# Patient Record
Sex: Female | Born: 1965
Health system: Southern US, Community
[De-identification: ages and names within clinical notes are randomized; demographics above are authoritative.]

## PROBLEM LIST (undated history)

## (undated) DIAGNOSIS — Z8709 Personal history of other diseases of the respiratory system: Secondary | ICD-10-CM

## (undated) DIAGNOSIS — Z8614 Personal history of Methicillin resistant Staphylococcus aureus infection: Secondary | ICD-10-CM

## (undated) DIAGNOSIS — Z8489 Family history of other specified conditions: Secondary | ICD-10-CM

## (undated) DIAGNOSIS — K219 Gastro-esophageal reflux disease without esophagitis: Secondary | ICD-10-CM

## (undated) DIAGNOSIS — T7840XA Allergy, unspecified, initial encounter: Secondary | ICD-10-CM

## (undated) DIAGNOSIS — R609 Edema, unspecified: Secondary | ICD-10-CM

## (undated) DIAGNOSIS — R51 Headache: Secondary | ICD-10-CM

## (undated) DIAGNOSIS — R42 Dizziness and giddiness: Secondary | ICD-10-CM

## (undated) DIAGNOSIS — K5909 Other constipation: Secondary | ICD-10-CM

## (undated) DIAGNOSIS — G932 Benign intracranial hypertension: Secondary | ICD-10-CM

## (undated) DIAGNOSIS — G4730 Sleep apnea, unspecified: Secondary | ICD-10-CM

## (undated) DIAGNOSIS — L309 Dermatitis, unspecified: Secondary | ICD-10-CM

## (undated) DIAGNOSIS — M545 Low back pain, unspecified: Secondary | ICD-10-CM

## (undated) DIAGNOSIS — I10 Essential (primary) hypertension: Secondary | ICD-10-CM

## (undated) DIAGNOSIS — M1990 Unspecified osteoarthritis, unspecified site: Secondary | ICD-10-CM

## (undated) DIAGNOSIS — M549 Dorsalgia, unspecified: Secondary | ICD-10-CM

## (undated) DIAGNOSIS — H93A9 Pulsatile tinnitus, unspecified ear: Secondary | ICD-10-CM

## (undated) DIAGNOSIS — E669 Obesity, unspecified: Secondary | ICD-10-CM

## (undated) DIAGNOSIS — R600 Localized edema: Secondary | ICD-10-CM

## (undated) DIAGNOSIS — E559 Vitamin D deficiency, unspecified: Secondary | ICD-10-CM

## (undated) HISTORY — PX: GALLBLADDER SURGERY: SHX652

## (undated) HISTORY — DX: Allergy, unspecified, initial encounter: T78.40XA

## (undated) HISTORY — DX: Obesity, unspecified: E66.9

## (undated) HISTORY — DX: Low back pain, unspecified: M54.50

## (undated) HISTORY — PX: CARDIOVASCULAR STRESS TEST: SHX262

## (undated) HISTORY — DX: Vitamin D deficiency, unspecified: E55.9

## (undated) HISTORY — DX: Low back pain: M54.5

## (undated) HISTORY — PX: CHOLECYSTECTOMY: SHX55

## (undated) HISTORY — DX: Pulsatile tinnitus, unspecified ear: H93.A9

## (undated) HISTORY — DX: Essential (primary) hypertension: I10

## (undated) HISTORY — DX: Gastro-esophageal reflux disease without esophagitis: K21.9

## (undated) HISTORY — PX: US ECHOCARDIOGRAPHY: HXRAD669

## (undated) HISTORY — PX: ABDOMINAL HYSTERECTOMY: SHX81

---

## 1999-04-11 ENCOUNTER — Ambulatory Visit (HOSPITAL_COMMUNITY): Admission: RE | Admit: 1999-04-11 | Discharge: 1999-04-11 | Payer: Self-pay | Admitting: Interventional Cardiology

## 1999-04-15 ENCOUNTER — Encounter: Admission: RE | Admit: 1999-04-15 | Discharge: 1999-04-15 | Payer: Self-pay | Admitting: Obstetrics and Gynecology

## 2001-05-24 ENCOUNTER — Other Ambulatory Visit: Admission: RE | Admit: 2001-05-24 | Discharge: 2001-05-24 | Payer: Self-pay | Admitting: Obstetrics and Gynecology

## 2001-08-08 ENCOUNTER — Encounter: Payer: Self-pay | Admitting: Internal Medicine

## 2001-08-08 ENCOUNTER — Encounter: Admission: RE | Admit: 2001-08-08 | Discharge: 2001-08-08 | Payer: Self-pay | Admitting: Internal Medicine

## 2002-01-20 ENCOUNTER — Encounter: Payer: Self-pay | Admitting: Emergency Medicine

## 2002-01-20 ENCOUNTER — Emergency Department (HOSPITAL_COMMUNITY): Admission: EM | Admit: 2002-01-20 | Discharge: 2002-01-20 | Payer: Self-pay | Admitting: Emergency Medicine

## 2002-02-22 ENCOUNTER — Encounter: Payer: Self-pay | Admitting: Family Medicine

## 2002-02-22 ENCOUNTER — Ambulatory Visit (HOSPITAL_COMMUNITY): Admission: RE | Admit: 2002-02-22 | Discharge: 2002-02-22 | Payer: Self-pay | Admitting: Family Medicine

## 2002-04-12 ENCOUNTER — Encounter: Admission: RE | Admit: 2002-04-12 | Discharge: 2002-04-12 | Payer: Self-pay | Admitting: Family Medicine

## 2002-04-12 ENCOUNTER — Encounter: Payer: Self-pay | Admitting: Family Medicine

## 2002-04-28 ENCOUNTER — Encounter: Admission: RE | Admit: 2002-04-28 | Discharge: 2002-04-28 | Payer: Self-pay | Admitting: Family Medicine

## 2002-04-28 ENCOUNTER — Encounter: Payer: Self-pay | Admitting: Family Medicine

## 2002-06-09 ENCOUNTER — Other Ambulatory Visit: Admission: RE | Admit: 2002-06-09 | Discharge: 2002-06-09 | Payer: Self-pay | Admitting: Obstetrics and Gynecology

## 2002-08-26 ENCOUNTER — Emergency Department (HOSPITAL_COMMUNITY): Admission: EM | Admit: 2002-08-26 | Discharge: 2002-08-26 | Payer: Self-pay | Admitting: Emergency Medicine

## 2002-08-26 ENCOUNTER — Encounter: Payer: Self-pay | Admitting: Emergency Medicine

## 2003-05-25 ENCOUNTER — Other Ambulatory Visit: Admission: RE | Admit: 2003-05-25 | Discharge: 2003-05-25 | Payer: Self-pay | Admitting: Obstetrics and Gynecology

## 2003-12-06 ENCOUNTER — Encounter (INDEPENDENT_AMBULATORY_CARE_PROVIDER_SITE_OTHER): Payer: Self-pay | Admitting: Specialist

## 2003-12-06 ENCOUNTER — Inpatient Hospital Stay (HOSPITAL_COMMUNITY): Admission: AD | Admit: 2003-12-06 | Discharge: 2003-12-09 | Payer: Self-pay | Admitting: Obstetrics and Gynecology

## 2004-02-26 ENCOUNTER — Ambulatory Visit: Admission: RE | Admit: 2004-02-26 | Discharge: 2004-02-26 | Payer: Self-pay | Admitting: Internal Medicine

## 2004-02-26 ENCOUNTER — Encounter (INDEPENDENT_AMBULATORY_CARE_PROVIDER_SITE_OTHER): Payer: Self-pay | Admitting: Cardiology

## 2005-06-02 ENCOUNTER — Encounter: Admission: RE | Admit: 2005-06-02 | Discharge: 2005-06-02 | Payer: Self-pay | Admitting: Obstetrics and Gynecology

## 2006-05-24 ENCOUNTER — Emergency Department (HOSPITAL_COMMUNITY): Admission: EM | Admit: 2006-05-24 | Discharge: 2006-05-24 | Payer: Self-pay | Admitting: Family Medicine

## 2006-07-28 ENCOUNTER — Encounter: Admission: RE | Admit: 2006-07-28 | Discharge: 2006-07-28 | Payer: Self-pay | Admitting: Obstetrics and Gynecology

## 2007-02-22 ENCOUNTER — Emergency Department (HOSPITAL_COMMUNITY): Admission: EM | Admit: 2007-02-22 | Discharge: 2007-02-22 | Payer: Self-pay | Admitting: Emergency Medicine

## 2007-04-28 ENCOUNTER — Emergency Department (HOSPITAL_COMMUNITY): Admission: EM | Admit: 2007-04-28 | Discharge: 2007-04-28 | Payer: Self-pay | Admitting: Emergency Medicine

## 2007-09-22 ENCOUNTER — Encounter: Admission: RE | Admit: 2007-09-22 | Discharge: 2007-09-22 | Payer: Self-pay | Admitting: Internal Medicine

## 2008-03-29 ENCOUNTER — Encounter: Admission: RE | Admit: 2008-03-29 | Discharge: 2008-03-29 | Payer: Self-pay | Admitting: Internal Medicine

## 2008-06-14 ENCOUNTER — Encounter: Admission: RE | Admit: 2008-06-14 | Discharge: 2008-06-14 | Payer: Self-pay | Admitting: Internal Medicine

## 2008-08-07 ENCOUNTER — Ambulatory Visit (HOSPITAL_COMMUNITY): Admission: AD | Admit: 2008-08-07 | Discharge: 2008-08-07 | Payer: Self-pay | Admitting: Sports Medicine

## 2008-08-24 ENCOUNTER — Encounter: Admission: RE | Admit: 2008-08-24 | Discharge: 2008-08-24 | Payer: Self-pay | Admitting: Sports Medicine

## 2008-11-16 ENCOUNTER — Encounter: Admission: RE | Admit: 2008-11-16 | Discharge: 2008-11-16 | Payer: Self-pay | Admitting: Sports Medicine

## 2008-12-16 ENCOUNTER — Emergency Department (HOSPITAL_COMMUNITY): Admission: EM | Admit: 2008-12-16 | Discharge: 2008-12-16 | Payer: Self-pay | Admitting: Emergency Medicine

## 2008-12-21 ENCOUNTER — Encounter: Admission: RE | Admit: 2008-12-21 | Discharge: 2008-12-21 | Payer: Self-pay | Admitting: Sports Medicine

## 2009-08-13 ENCOUNTER — Encounter: Admission: RE | Admit: 2009-08-13 | Discharge: 2009-08-13 | Payer: Self-pay | Admitting: Obstetrics and Gynecology

## 2010-03-28 ENCOUNTER — Encounter: Admission: RE | Admit: 2010-03-28 | Discharge: 2010-03-28 | Payer: Self-pay | Admitting: Internal Medicine

## 2010-08-11 ENCOUNTER — Other Ambulatory Visit: Payer: Self-pay | Admitting: Obstetrics and Gynecology

## 2010-08-11 DIAGNOSIS — Z1231 Encounter for screening mammogram for malignant neoplasm of breast: Secondary | ICD-10-CM

## 2010-09-08 ENCOUNTER — Ambulatory Visit
Admission: RE | Admit: 2010-09-08 | Discharge: 2010-09-08 | Disposition: A | Discharge status: Home / Self Care | Payer: Commercial Managed Care - PPO | Source: Ambulatory Visit | Attending: Obstetrics and Gynecology | Admitting: Obstetrics and Gynecology

## 2010-09-08 DIAGNOSIS — Z1231 Encounter for screening mammogram for malignant neoplasm of breast: Secondary | ICD-10-CM

## 2010-09-09 ENCOUNTER — Other Ambulatory Visit: Payer: Self-pay | Admitting: Internal Medicine

## 2010-09-09 DIAGNOSIS — R0989 Other specified symptoms and signs involving the circulatory and respiratory systems: Secondary | ICD-10-CM

## 2010-10-09 ENCOUNTER — Ambulatory Visit
Admission: RE | Admit: 2010-10-09 | Discharge: 2010-10-09 | Disposition: A | Discharge status: Home / Self Care | Payer: Commercial Managed Care - PPO | Source: Ambulatory Visit | Attending: Internal Medicine | Admitting: Internal Medicine

## 2010-10-09 DIAGNOSIS — R0989 Other specified symptoms and signs involving the circulatory and respiratory systems: Secondary | ICD-10-CM

## 2010-10-17 ENCOUNTER — Encounter: Payer: Self-pay | Admitting: Internal Medicine

## 2010-11-21 NOTE — Discharge Summary (Signed)
[name], [name]                          ACCOUNT NO.:  [id number]   MEDICAL RECORD NO.:  [id number]                   PATIENT TYPE:  INP   LOCATION:  9114                                 FACILITY:  [facility]   PHYSICIAN:  Maxie Better, M.D.            DATE OF BIRTH:  1965-07-20   DATE OF ADMISSION:  12/06/2003  DATE OF DISCHARGE:  12/09/2003                                 DISCHARGE SUMMARY   ADMISSION DIAGNOSES:  1. Term gestation.  2. Chronic hypertension.  3. Spontaneous rupture of membranes.  4. Group B strep positive.   DISCHARGE DIAGNOSES:  1. Term gestation, delivered.  2. Chronic hypertension.  3. Status post a primary cesarean section.  4. Presumed chorioamnionitis, resolved.  5. Arrest of dilatation.  6. Desired sterilization.  7. Right paratubal cyst.  8. Postoperative anemia.   HISTORY OF PRESENT ILLNESS:  A 45 year old, gravida 2, para 1-0-0-1, female  at term with chronic hypertension, controlled on Aldomet, admitted to  [facility] with spontaneous rupture of membranes, clear amniotic  fluid.  The patient is known to be group B strep culture positive.  She has  had antepartum fetal surveillance due to her chronic hypertension.  Estimated fetal weight by ultrasound was 8 pounds, 1 ounce.  The patient's  blood type is B positive.  Rubella was immune.  Hepatitis B surface antigen  was negative.   HOSPITAL COURSE:  The patient was admitted to [facility].  Clear  amniotic fluid was noted at 6:30 a.m.  Penicillin prophylaxis was started.  The patient was continued on her Aldomet.  Low-dose Pitocin was started.  The patient had an epidural when she was 1 cm, 80%, -3, due to increased  pain with her contractions.  She was contracting every 3 minutes at that  time.  Each contraction was 80 [place] units.  Intrauterine pressure  catheter had been placed.  The patient progressed to 4 cm, about 80%, -2.  She had a temperature of 100.1 around 9:10 p.m.   Presumed chorioamnionitis  was diagnosed at that time, and the patient was changed to ampicillin and  gentamicin.  Pitocin was continued.  The patient was begun having repetitive  late decelerations, which initially were noted after the patient had a low  blood pressure shortly after the epidural placement.  The patient received  IV fluid boluses, as well as ephedrine x3.  The blood pressure returned back  to her baseline; however, her late decelerations persisted, and therefore  the Pitocin was discontinued.  The patient was given maternal oxygenation.  Scalp stimulation was noted to be positive.  The cervix was edematous, 4 cm,  -2.  Her contractions were about 230 [place] units over several hours  with no further change in the cervix or fetal position, despite exaggerated  __________  position.  Given the findings of the edematous cervix, and no  change despite adequate labor for several hours, the recommendation  was made  for cesarean section.  The patient was taken to the operating room for a  cesarean section.  She also requested and desired sterilization.  She  underwent a primary cesarean section which resulted in a live female from the  left occiput posterior presentation, weighing 8 pounds, 2 ounces.  Normal  ovaries were noted bilaterally.  She had a right paratubal cyst that was  removed.  Apgars of 8/9.  Cord pH was 7.29.  The patient was continued on  her antibiotics postoperatively until she was afebrile.  On postoperative  day #2, the incision was inspected.  No erythema or induration was noted;  however __________  with a known large subcutaneous space.  About 40 cc of  serosanguineous fluid was noted.  The fascia was intact on Q-tips, and the  staples remained in place.  On postoperative day #3, the patient had  remained afebrile, tolerating a regular diet, blood pressures ranged between  109-125/66-75, and was deemed well to be discharged home.   DISPOSITION:   Home.   CONDITION:  Stable.   DISCHARGE MEDICATIONS:  1. Aldomet 250 mg p.o. b.i.d.  2. Tylox 1-2 tablets q.3-4h. p.r.n. pain.  3. Motrin 600 mg q.6h. p.r.n. pain.  4. Prenatal vitamins one p.o. daily.  5. Over-the-counter iron supplementation one p.o. daily.   FOLLOW UP APPOINTMENT:  Staple removal in the office on December 12, 2003, and  follow up appointment otherwise in 4 weeks.   DISCHARGE INSTRUCTIONS:  Per the postpartum booklet given.   FINAL PATHOLOGY:  Complete transection of the fallopian tube, and a benign  paratubal cyst.  The placenta had mild acute chorioamnionitis.                                               Maxie Better, M.D.    [place]/MEDQ  D:  12/22/2003  T:  12/24/2003  Job:  [id number]

## 2010-11-21 NOTE — Op Note (Signed)
[name], [name]                          ACCOUNT NO.:  [id number]   MEDICAL RECORD NO.:  [id number]                   PATIENT TYPE:  INP   LOCATION:  9114                                 [facility]ACILITY[facility]  [facility]   PHYSICIAN:  Maxie Better, M.D.            DATE OF BIRTH:  [birth year]   DATE OF PROCEDURE:  12/06/2003  DATE OF DISCHARGE:                                 OPERATIVE REPORT   PREOPERATIVE DIAGNOSES:  1. Arrest of dilatation.  2. Term gestation.  3. Desires sterilization.   PROCEDURE:  1. Primary cesarean section  2. Modified Pomeroy tubal ligation.  3. Right paratubal cyst removal.   POSTOPERATIVE DIAGNOSES:  1. Arrest of dilatation.  2. Right paratubal cyst.  3. Desires sterilization.  4. Term gestation.   ANESTHESIA:  Epidural.   SURGEON:  Maxie Better, M.D.   ASSISTANT:  Cordelia Pen A. Rosalio Macadamia, M.D.   INDICATIONS:  This is a 45 year old gravida 2, para 1 married black female  at term, with chronic hypertension controlled on Aldomet.  She presented on  December 06, 2003 with spontaneous rupture of membranes and early labor.  The  patient during the course of labor developed a temperature of 99.1 axillary,  for which she received ampicillin and gentamicin or presumed  chorioamnionitis.  An intrauterine pressure catheter has been placed for  assessment of her contractile pattern.  Low-dose Pitocin was started.  Penicillin prophylaxis was used for her known Group B strep culture  positivity.   The patient had adequate contractile strength for several hours and  progressed only to 4-5 cm dilatation, at which time the patient began to  have edematous cervix.  The patient was noted to have some repetitive late  deceleration, coinciding shortly after epidural bolus that resulted in  hypotension.  The Pitocin was subsequently discontinued as a result of the  late deceleration.  The fetus had a positive scalp stimulation thereafter;  however, given her clinical  examination, decision was made to proceed with a  cesarean section for arrest of dilatation.   Risks and benefits of the procedure have been explained to the patient.  Consent had been signed.  The patient desires permanent sterilization.   DESCRIPTION OF PROCEDURE:  Under adequate epidural anesthesia, the patient  was placed in a supine position with the left lateral tilt.  An indwelling  Foley catheter was already in place.  The patient was sterilely prepped and  draped in the usual fashion.  A Pfannenstiel skin incision was made after  0.25% Marcaine was injected.  The Pfannenstiel incision was carried down to  the rectus fascia.  Rectus fascia was incised in the midline, extended  bilaterally.  The rectus fascia was then bluntly and sharply dissected off  the rectus muscle superiorly and inferiorly.  The rectus muscle was split in  the midline.  The parietoperitoneum was entered bluntly and extended.  Vesicouterine peritoneum was developed.  The bladder was bluntly  dissected  off the lower uterine segment.  A low transverse uterine incision was then  made and extended bilaterally using bandage scissors.  On entering the  abdominal cavity there had been moderate amount of fluid.  The uterine  incision, having been extended bilaterally, resulted in delivery of a live  female from the left occiput posterior presentation.  His weight was 8 pounds  2 ounces.  Normal tubes and ovaries were noted bilaterally.  The right tube  had a paratubal cyst noted, that measured over 1.5 cm.  The placenta was  spontaneously delivered intact.  Cord pH was obtained and was 7.29.  Apgar's  were given 8 and 9.   After the delivery of the baby and the placenta, the uterine cavity was  cleaned of debris.  It was noted that there was an extension on the right  inferior aspect of the incision, which was closed with 0 Monocryl running,  locked stitch.  The remaining incision was then closed with 0 Monocryl   running, locked stitch; embrocated with 0 Monocryl suture in the second  layer.  There were several bleeding sites noted, with small cauterization  utilized; with the bleeding on the left aspect of incision that required  several figure-of-eight 0 Monocryl sutures, with subsequent hemostasis  noted.  The peritoneal edges were then cauterized, as well as the bleeding  of the undersurface of the bladder (which had small bleeders which were  cauterized).   Attention was then subsequently turned to the tubes and ovaries, where both  tubes were identified down to their fimbriated end.  The right paratubal  cyst was removed.  The mid portion of the fallopian tubes bilaterally was  removed and the proximal and distal portion of the tubes bilaterally were  tied with 0 chromic sutures -- proximally x2 and distally x2.  The abdomen  was irrigated, suctioned of debris.  Reinspection of the lower uterine  segment was noted for small bleeders again, which were then cauterized.   After a satisfactory hemostasis was felt to have been accomplished, the  abdomen was irrigated and suctioned of debris.  The rectus  muscles were  inspected, left small bleeders cauterized.  The undersurface of the rectus  fascia was inspected and small bleeders cauterized.  The rectus fascia was  then closed with 0 Vicryl x2.  The subcutaneous area was irrigated and small  bleeders cauterized, and the skin approximated using Ethibond staples.   SPECIMENS:  Placenta sent to pathology.  Portion of the right and left  fallopian tubes.  Right paratubal cyst sent to pathology.   COUNTS:  Sponge and instrument counts x2 was correct.   ESTIMATED BLOOD LOSS:  700 cc.   INTRAOPERATIVE FLUID:  2500 cc Crystalloid.   URINE OUTPUT:  100 cc of concentrated urine.   COMPLICATIONS:  None.   DISPOSITION:  The patient tolerated the procedure well and was transferred  to the recovery room in stable condition.                                              Maxie Better, M.D.   [place]/MEDQ  D:  12/07/2003  T:  12/07/2003  Job:  [id number]

## 2010-11-21 NOTE — H&P (Signed)
[name], DONE                          ACCOUNT NO.:  [id number]   MEDICAL RECORD NO.:  [id number]                   PATIENT TYPE:  INP   LOCATION:  9167                                 FACILITY:  [facility]   PHYSICIAN:  Maxie Better, M.D.            DATE OF BIRTH:  01/24/1966   DATE OF ADMISSION:  12/06/2003  DATE OF DISCHARGE:                                HISTORY & PHYSICAL   CHIEF COMPLAINT:  Spontaneous rupture of membranes at 6:30 a.m., clear  fluid.   HISTORY OF PRESENT ILLNESS:  This is a 45 year old gravida 2, para 1-0-0-1  married black female at term with chronic hypertension controlled on  Aldomet, now admitted at [facility] for spontaneous rupture of  membranes with clear amniotic fluid at 6;30 a.m.  The patient is known to be  group B strep culture positive.  She has been having some irregular  contractions subsequent to rupturing her membranes.  The patient has been  followed closely by Dr. Sherrie George, perinatologist, due to her chronic  hypertension and has had antepartum fetal surveillance which has been  reassuring.  The patient has been measured size greater than dates with  estimated fetal weight done on last ultrasound a week ago was 8 pounds 1  ounce.  The amniotic fluid has been in the upper normal range.  The patient  has noted good fetal movement.  Prenatal care is at [facility], primary  physician Maxie Better, M.D.   PRENATAL LABORATORY DATA:  Group B strep positive.  Blood type is B  positive.  Hemoglobin electrophoresis is normal.  RPR is nonreactive.  Rubella is immune.  Hepatitis B surface antigen is negative. GC and  Chlamydia cultures were negative.  Tap was normal.  One-hour glucose  challenge test was normal.  The patient underwent anatomic fetal survey at  [facility].  The patient had first trimester genetic screening at  [facility] which was normal.  The patient declined an  amniocentesis.   The patient  desires tubal ligation.  Her ASP for open neural tube defect was  normal.   ALLERGIES:  No known drug allergies.   MEDICATIONS:  Prenatal vitamins and Aldomet.   PAST MEDICAL HISTORY:  1. Chronic hypertension.  2. Exogenous obesity.   PAST SURGICAL HISTORY:  Laparoscopic cholecystectomy in 1999.   PAST OBSTETRICAL HISTORY:  Vaginal delivery February 1992 of 8 pound 9 ounce  baby at 40 weeks.   FAMILY HISTORY:  Maternal aunt died of breast cancer.  Mother has diabetes,  rheumatoid arthritis.   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   REVIEW OF SYSTEMS:  Positive for lower extremity edema throughout pregnancy.  Otherwise as per HPI.   PHYSICAL EXAMINATION:  GENERAL:  Gravid black female in mild distress.  VITAL SIGNS:  Blood pressure 141/78, afebrile.  SKIN:  No lesions.  HEENT:  Anicteric sclerae.  Pink conjunctivae.  Oropharynx  negative.  HEART:  Regular rate and rhythm without murmur.  LUNGS:  Clear to auscultation.  BREASTS:  Soft, nontender.  No palpable mass.  ABDOMEN:  Gravid.  PELVIC:  1 cm, 50% effaced presenting part vertex -3.  EXTREMITIES:  2+ edema.   LABORATORY AND X-RAY DATA:  Tracing was baseline fetal heart rate 120s with  contractions irregular every 3 to 4 minutes.   IMPRESSION:  1. Term gestation.  2. Chronic hypertension on Aldomet.  3. Group B Streptococcus culture positive.  4. Spontaneous rupture of membranes.   PLAN:  1. Admission.  2. Continue Aldomet medication.  3. Start low-dose Pitocin.  4. Penicillin prophylaxis.  5. Routine admission labs and orders per [facility].  6. PIH labs p.r.n.  No indication for magnesium sulfate at this time.                                               Maxie Better, M.D.    [place]/MEDQ  D:  12/06/2003  T:  12/06/2003  Job:  [id number]

## 2011-01-16 ENCOUNTER — Other Ambulatory Visit: Payer: Self-pay | Admitting: Internal Medicine

## 2011-01-16 DIAGNOSIS — H9201 Otalgia, right ear: Secondary | ICD-10-CM

## 2011-01-22 ENCOUNTER — Other Ambulatory Visit: Payer: Commercial Managed Care - PPO

## 2011-01-23 ENCOUNTER — Ambulatory Visit
Admission: RE | Admit: 2011-01-23 | Discharge: 2011-01-23 | Disposition: A | Discharge status: Home / Self Care | Payer: Commercial Managed Care - PPO | Source: Ambulatory Visit | Attending: Internal Medicine | Admitting: Internal Medicine

## 2011-01-23 DIAGNOSIS — H9201 Otalgia, right ear: Secondary | ICD-10-CM

## 2011-01-23 MED ORDER — GADOBENATE DIMEGLUMINE 529 MG/ML IV SOLN
20.0000 mL | Freq: Once | INTRAVENOUS | Status: AC | PRN
  Administered 2011-01-23: 20 mL via INTRAVENOUS

## 2011-04-15 LAB — POCT RAPID STREP A: Streptococcus, Group A Screen (Direct): NEGATIVE

## 2011-09-15 ENCOUNTER — Other Ambulatory Visit: Payer: Self-pay | Admitting: Obstetrics and Gynecology

## 2011-09-15 DIAGNOSIS — Z1231 Encounter for screening mammogram for malignant neoplasm of breast: Secondary | ICD-10-CM

## 2011-10-05 ENCOUNTER — Ambulatory Visit
Admission: RE | Admit: 2011-10-05 | Discharge: 2011-10-05 | Disposition: A | Discharge status: Home / Self Care | Payer: 59 | Source: Ambulatory Visit | Attending: Obstetrics and Gynecology | Admitting: Obstetrics and Gynecology

## 2011-10-05 ENCOUNTER — Ambulatory Visit: Payer: Commercial Managed Care - PPO

## 2011-10-05 DIAGNOSIS — Z1231 Encounter for screening mammogram for malignant neoplasm of breast: Secondary | ICD-10-CM

## 2011-10-06 ENCOUNTER — Ambulatory Visit: Payer: Commercial Managed Care - PPO

## 2011-10-10 LAB — HM PAP SMEAR

## 2011-10-10 LAB — HM MAMMOGRAPHY

## 2011-12-10 LAB — CBC AND DIFFERENTIAL
HCT: 41 % (ref 36–46)
Hemoglobin: 13.2 g/dL (ref 12.0–16.0)
Platelets: 333 10*3/uL (ref 150–399)

## 2011-12-10 LAB — BASIC METABOLIC PANEL
BUN: 11 mg/dL (ref 4–21)
Glucose: 83 mg/dL

## 2011-12-10 LAB — HEPATIC FUNCTION PANEL
AST: 19 U/L (ref 13–35)
Alkaline Phosphatase: 72 U/L (ref 25–125)
Bilirubin, Total: 0.5 mg/dL

## 2012-07-20 ENCOUNTER — Encounter: Payer: Self-pay | Admitting: Hematology

## 2012-07-20 DIAGNOSIS — I10 Essential (primary) hypertension: Secondary | ICD-10-CM | POA: Insufficient documentation

## 2012-07-20 DIAGNOSIS — E669 Obesity, unspecified: Secondary | ICD-10-CM

## 2012-07-20 DIAGNOSIS — F439 Reaction to severe stress, unspecified: Secondary | ICD-10-CM

## 2012-07-20 DIAGNOSIS — H9209 Otalgia, unspecified ear: Secondary | ICD-10-CM | POA: Insufficient documentation

## 2012-07-31 ENCOUNTER — Emergency Department (HOSPITAL_COMMUNITY)
Admission: EM | Admit: 2012-07-31 | Discharge: 2012-07-31 | Disposition: A | Discharge status: Home / Self Care | Payer: 59 | Source: Home / Self Care | Attending: Emergency Medicine | Admitting: Emergency Medicine

## 2012-07-31 ENCOUNTER — Encounter (HOSPITAL_COMMUNITY): Payer: Self-pay | Admitting: Emergency Medicine

## 2012-07-31 DIAGNOSIS — J0100 Acute maxillary sinusitis, unspecified: Secondary | ICD-10-CM

## 2012-07-31 MED ORDER — AMOXICILLIN-POT CLAVULANATE 875-125 MG PO TABS: 1.0000 | ORAL_TABLET | Freq: Two times a day (BID) | ORAL | Status: DC

## 2012-07-31 NOTE — ED Provider Notes (Signed)
Chief Complaint  Patient presents with  . Facial Pain    sinus congestion. green mucus/blood tinged. 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 She's also had maxillary pain and pressure on the right which radiates towards her ear and into the neck and she's had some postnasal drip, her right eye has been somewhat red, she's had sore throat, and cough. She has seasonal allergies and is on Zyrtec for that. She also takes but has a pill for high blood pressure.  Review of Systems:  Other than noted above, the patient denies any of the following symptoms. Systemic:  No fever, chills, sweats, fatigue, myalgias, headache, or anorexia. Eye:  No redness, pain or drainage. ENT:  No earache, ear congestion, nasal congestion, sneezing, rhinorrhea, sinus pressure, sinus pain, post nasal drip, or sore throat. Lungs:  No cough, sputum production, wheezing, shortness of breath, or chest pain. GI:  No abdominal pain, nausea, vomiting, or diarrhea.  PMFSH:  Past medical history, family history, social history, meds, and allergies were reviewed.  Physical Exam:   Vital signs:  BP 119/80  Pulse 74  Temp 98.4 F (36.9 C) (Oral)  Resp 17  SpO2 99%  LMP 07/06/2012 General:  Alert, in no distress. Eye:  No conjunctival injection or drainage. Lids were normal. ENT:  TMs and canals were normal, without erythema or inflammation.  Nasal mucosa was clear and uncongested, without drainage.  Mucous membranes were moist.  Pharynx was clear, without exudate or drainage.  There were no oral ulcerations or lesions. Neck:  Supple, no adenopathy, tenderness or mass. Lungs:  No respiratory distress.  Lungs were clear to auscultation, without wheezes, rales or rhonchi.  Breath sounds were  clear and equal bilaterally.  Heart:  Regular rhythm, without gallops, murmers or rubs. Skin:  Clear, warm, and dry, without rash or lesions.  Assessment:  The encounter diagnosis was Acute maxillary sinusitis.  Plan:   1.  The following meds were prescribed:   New Prescriptions   AMOXICILLIN-CLAVULANATE (AUGMENTIN) 875-125 MG PER TABLET    Take 1 tablet by mouth 2 (two) times daily.   2.  The patient was instructed in symptomatic care and handouts were given. 3.  The patient was told to return if becoming worse in any way, if no better in 3 or 4 days, and given some red flag symptoms that would indicate earlier return.   Reuben Likes, MD 07/31/12 [phone]

## 2012-07-31 NOTE — ED Notes (Signed)
Pt c/o sinus pressure. And with blowing nose produces green mucus tinged with blood. Severe stuffiness of right nostril. Post nasal drip. Symptoms present x 1wk.  Pt has used saline nasal rinses and humidifier with only mild relief. Pt denies fever and any other symptoms.

## 2012-08-20 ENCOUNTER — Other Ambulatory Visit: Payer: Self-pay

## 2012-08-22 ENCOUNTER — Emergency Department (HOSPITAL_COMMUNITY)
Admission: EM | Admit: 2012-08-22 | Discharge: 2012-08-22 | Disposition: A | Discharge status: Home / Self Care | Payer: 59 | Attending: Emergency Medicine | Admitting: Emergency Medicine

## 2012-08-22 ENCOUNTER — Emergency Department (HOSPITAL_COMMUNITY): Payer: 59

## 2012-08-22 ENCOUNTER — Encounter (HOSPITAL_COMMUNITY): Payer: Self-pay | Admitting: Emergency Medicine

## 2012-08-22 DIAGNOSIS — R0789 Other chest pain: Secondary | ICD-10-CM

## 2012-08-22 DIAGNOSIS — I10 Essential (primary) hypertension: Secondary | ICD-10-CM | POA: Insufficient documentation

## 2012-08-22 DIAGNOSIS — Z9109 Other allergy status, other than to drugs and biological substances: Secondary | ICD-10-CM | POA: Insufficient documentation

## 2012-08-22 DIAGNOSIS — Z79899 Other long term (current) drug therapy: Secondary | ICD-10-CM | POA: Insufficient documentation

## 2012-08-22 DIAGNOSIS — E669 Obesity, unspecified: Secondary | ICD-10-CM | POA: Insufficient documentation

## 2012-08-22 LAB — CBC WITH DIFFERENTIAL/PLATELET
Basophils Absolute: 0 10*3/uL (ref 0.0–0.1)
Basophils Relative: 1 % (ref 0–1)
Eosinophils Absolute: 0.2 10*3/uL (ref 0.0–0.7)
Eosinophils Relative: 3 % (ref 0–5)
Lymphs Abs: 2.2 10*3/uL (ref 0.7–4.0)
MCH: 27.7 pg (ref 26.0–34.0)
MCV: 84 fL (ref 78.0–100.0)
Neutrophils Relative %: 41 % — ABNORMAL LOW (ref 43–77)
Platelets: 281 10*3/uL (ref 150–400)
RBC: 4.55 MIL/uL (ref 3.87–5.11)
RDW: 13.6 % (ref 11.5–15.5)

## 2012-08-22 LAB — BASIC METABOLIC PANEL
Calcium: 8.6 mg/dL (ref 8.4–10.5)
GFR calc Af Amer: >90 mL/min (ref >90)
GFR calc non Af Amer: >90 mL/min (ref >90)
Glucose, Bld: 102 mg/dL — ABNORMAL HIGH (ref 70–99)
Potassium: 3.6 mEq/L (ref 3.5–5.1)
Sodium: 140 mEq/L (ref 135–145)

## 2012-08-22 LAB — POCT I-STAT TROPONIN I
Troponin i, poc: 0 ng/mL (ref 0.00–0.08)
Troponin i, poc: 0 ng/mL (ref 0.00–0.08)

## 2012-08-22 MED ORDER — ONDANSETRON HCL 4 MG/2ML IJ SOLN
4.0000 mg | Freq: Once | INTRAMUSCULAR | Status: DC
  Filled 2012-08-22: qty 2

## 2012-08-22 MED ORDER — MORPHINE SULFATE 4 MG/ML IJ SOLN
4.0000 mg | Freq: Once | INTRAMUSCULAR | Status: DC
  Filled 2012-08-22: qty 1

## 2012-08-22 MED ORDER — HYDROCODONE-ACETAMINOPHEN 5-325 MG PO TABS: 1.0000 | ORAL_TABLET | Freq: Four times a day (QID) | ORAL | Status: DC | PRN

## 2012-08-22 MED ORDER — IBUPROFEN 800 MG PO TABS: 800.0000 mg | ORAL_TABLET | Freq: Three times a day (TID) | ORAL | Status: DC | PRN

## 2012-08-22 NOTE — ED Notes (Signed)
Patient transported to X-ray 

## 2012-08-22 NOTE — ED Provider Notes (Signed)
History     CSN: [id number]  Arrival date & time 08/22/12  3244   First MD Initiated Contact with Patient 08/22/12 0940      Chief Complaint  Patient presents with  . Chest Pain    (Consider location/radiation/quality/duration/timing/severity/associated sxs/prior treatment) HPI Patient is a 47 yo female who presents today with chest pressure.  This pressure has been going on for three years ever since she switched jobs.  It's waxing and waning and over the past six months it's increased in frequency and that is the reason she came in today.  She notices that it's worse with stress and if she relaxes she can make the pressure go away.  No changes in pressure with certain types of food or positions.  The pressure is located in the middle of her chest at the sternum and it radiates to the back.  Denies any shortness of breath, nausea, vomiting, diarrhea, constipation, abdominal pain or dysuria.  10-15 years ago she had a cholecystectomy.   Past Medical History  Diagnosis Date  . Hypertension   . Allergy   . Unspecified vitamin D deficiency   . Lumbago   . Obesity, unspecified     History reviewed. No pertinent past surgical history.  No family history on file.  History  Substance Use Topics  . Smoking status: Never Smoker   . Smokeless tobacco: Not on file  . Alcohol Use: No    OB History   Grav Para Term Preterm Abortions TAB SAB Ect Mult Living                  Review of Systems All other systems negative except as documented in the HPI. All pertinent positives and negatives as reviewed in the HPI.  Allergies  Review of patient's allergies indicates no known allergies.  Home Medications   Current Outpatient Rx  Name  Route  Sig  Dispense  Refill  . benazepril-hydrochlorthiazide (LOTENSIN HCT) 20-25 MG per tablet   Oral   Take 1 tablet by mouth daily.         . cetirizine (ZYRTEC) 10 MG tablet   Oral   Take 10 mg by mouth daily.           . fluticasone  (FLONASE) 50 MCG/ACT nasal spray   Nasal   2 sprays by Nasal route daily.           Marland Kitchen levalbuterol (XOPENEX HFA) 45 MCG/ACT inhaler   Inhalation   Inhale 1-2 puffs into the lungs as needed.           Marland Kitchen OVER THE COUNTER MEDICATION   Oral   Take 1 tablet by mouth daily as needed. OTC for pain           BP 112/73  Pulse 77  Temp(Src) 98.1 F (36.7 C) (Oral)  SpO2 96%  LMP 07/06/2012  Physical Exam  Constitutional: She is oriented to person, place, and time. She appears well-developed and well-nourished. No distress.  HENT:  Head: Normocephalic and atraumatic.  Eyes: Conjunctivae and EOM are normal. Pupils are equal, round, and reactive to light.  Neck: Normal range of motion.  Cardiovascular: Normal rate, regular rhythm and normal heart sounds.  Exam reveals no gallop and no friction rub.   No murmur heard. Pulmonary/Chest: Effort normal and breath sounds normal. No respiratory distress. She has no wheezes. She has no rales. She exhibits no tenderness.  Abdominal: Soft. Bowel sounds are normal. She exhibits no distension and  no mass. There is no tenderness. There is no rebound and no guarding.  Neurological: She is alert and oriented to person, place, and time. No cranial nerve deficit.  Skin: No rash noted. She is not diaphoretic. No erythema. No pallor.  Psychiatric: She has a normal mood and affect. Her behavior is normal. Judgment and thought content normal.    ED Course  Procedures (including critical care time)  The patient is advised that with the chronic nature of her pain that this seems less likely to be an acute cardiac issue. The patient would like to follow up with Dr. Katrinka Blazing of [facility]. The patient is advised to return here as needed. She is also asked to follow up with her PCP.    MDM   Date: 08/24/2012  Rate: 78  Rhythm: normal sinus rhythm  QRS Axis: normal  Intervals: normal  ST/T Wave abnormalities: normal  Conduction Disutrbances:none   Narrative Interpretation:   Old EKG Reviewed: none available   MDM Reviewed: vitals and nursing note Interpretation: labs, x-ray and ECG           Carlyle Dolly, PA-C 08/24/12 [phone]

## 2012-08-22 NOTE — ED Notes (Signed)
Pt states her pain is increasing, has not had anything for pain since arrival, Ebbie Ridge, [place] notified, he will order zofran and morphine.

## 2012-08-22 NOTE — ED Notes (Signed)
Onset last night chest pressure radiating to middle back and tingling left upper extremity. Pain currently 5-6/10 pressure denies shortness of breath.

## 2012-08-24 NOTE — ED Provider Notes (Signed)
Medical screening examination/treatment/procedure(s) were conducted as a shared visit with non-physician practitioner(s) and myself.  I personally evaluated the patient during the encounter  [name] is a 47 y.o. female here with chest pressure. Its chronic pain that got worse yesterday. EKG unremarkable. Trop neg x 2.  She saw Dr. Katrinka Blazing before. I gave her the option of getting cardiology consult in ED but she didn't want to wait for them to come. She rather f/u outpatient. Stable for d/c.    Richardean Canal, MD 08/24/12 [phone]

## 2012-09-06 ENCOUNTER — Other Ambulatory Visit: Payer: Self-pay

## 2012-09-06 DIAGNOSIS — Z1231 Encounter for screening mammogram for malignant neoplasm of breast: Secondary | ICD-10-CM

## 2012-09-08 ENCOUNTER — Other Ambulatory Visit (HOSPITAL_COMMUNITY): Payer: Self-pay | Admitting: Cardiology

## 2012-09-08 DIAGNOSIS — R079 Chest pain, unspecified: Secondary | ICD-10-CM

## 2012-09-14 ENCOUNTER — Encounter (HOSPITAL_COMMUNITY)
Admission: RE | Admit: 2012-09-14 | Discharge: 2012-09-14 | Disposition: A | Discharge status: Home / Self Care | Payer: 59 | Source: Ambulatory Visit | Attending: Cardiology | Admitting: Cardiology

## 2012-09-14 DIAGNOSIS — R079 Chest pain, unspecified: Secondary | ICD-10-CM | POA: Insufficient documentation

## 2012-09-14 MED ORDER — TECHNETIUM TC 99M SESTAMIBI - CARDIOLITE
30.0000 | Freq: Once | INTRAVENOUS | Status: AC | PRN
  Administered 2012-09-14: 13:00:00 30 via INTRAVENOUS

## 2012-09-14 MED ORDER — TECHNETIUM TC 99M SESTAMIBI GENERIC - CARDIOLITE
10.0000 | Freq: Once | INTRAVENOUS | Status: AC | PRN
  Administered 2012-09-14: 10 via INTRAVENOUS

## 2012-09-29 ENCOUNTER — Other Ambulatory Visit (HOSPITAL_COMMUNITY): Payer: Self-pay | Admitting: Otolaryngology

## 2012-09-29 DIAGNOSIS — H93A3 Pulsatile tinnitus, bilateral: Secondary | ICD-10-CM

## 2012-10-05 ENCOUNTER — Ambulatory Visit: Payer: 59

## 2012-10-06 ENCOUNTER — Ambulatory Visit (HOSPITAL_COMMUNITY): Payer: 59

## 2012-10-14 ENCOUNTER — Other Ambulatory Visit (HOSPITAL_COMMUNITY): Payer: Self-pay | Admitting: Otolaryngology

## 2012-10-14 ENCOUNTER — Ambulatory Visit (HOSPITAL_COMMUNITY)
Admission: RE | Admit: 2012-10-14 | Discharge: 2012-10-14 | Disposition: A | Discharge status: Home / Self Care | Payer: 59 | Source: Ambulatory Visit | Attending: Otolaryngology | Admitting: Otolaryngology

## 2012-10-14 DIAGNOSIS — H93A3 Pulsatile tinnitus, bilateral: Secondary | ICD-10-CM

## 2012-10-14 DIAGNOSIS — Q2730 Arteriovenous malformation, site unspecified: Secondary | ICD-10-CM

## 2012-10-14 DIAGNOSIS — H9319 Tinnitus, unspecified ear: Secondary | ICD-10-CM | POA: Insufficient documentation

## 2012-10-14 MED ORDER — GADOBENATE DIMEGLUMINE 529 MG/ML IV SOLN
20.0000 mL | Freq: Once | INTRAVENOUS | Status: AC | PRN
  Administered 2012-10-14: 20 mL via INTRAVENOUS

## 2012-10-17 LAB — POCT I-STAT, CHEM 8
BUN: 25 mg/dL — ABNORMAL HIGH (ref 6–23)
Calcium, Ion: 1.14 mmol/L (ref 1.12–1.23)
Chloride: 102 mEq/L (ref 96–112)
Creatinine, Ser: 0.8 mg/dL (ref 0.50–1.10)
Glucose, Bld: 99 mg/dL (ref 70–99)
Potassium: 5.3 mEq/L — ABNORMAL HIGH (ref 3.5–5.1)

## 2012-11-02 ENCOUNTER — Ambulatory Visit
Admission: RE | Admit: 2012-11-02 | Discharge: 2012-11-02 | Disposition: A | Discharge status: Home / Self Care | Payer: 59 | Source: Ambulatory Visit

## 2012-11-02 DIAGNOSIS — Z1231 Encounter for screening mammogram for malignant neoplasm of breast: Secondary | ICD-10-CM

## 2012-11-03 ENCOUNTER — Ambulatory Visit (INDEPENDENT_AMBULATORY_CARE_PROVIDER_SITE_OTHER): Payer: 59 | Admitting: Neurology

## 2012-11-03 ENCOUNTER — Encounter: Payer: Self-pay | Admitting: Neurology

## 2012-11-03 VITALS — BP 123/76 | HR 81 | Ht 64.0 in | Wt 270.0 lb

## 2012-11-03 DIAGNOSIS — H9311 Tinnitus, right ear: Secondary | ICD-10-CM

## 2012-11-03 DIAGNOSIS — H9319 Tinnitus, unspecified ear: Secondary | ICD-10-CM

## 2012-11-03 DIAGNOSIS — H93A9 Pulsatile tinnitus, unspecified ear: Secondary | ICD-10-CM | POA: Insufficient documentation

## 2012-11-03 DIAGNOSIS — R51 Headache: Secondary | ICD-10-CM

## 2012-11-03 MED ORDER — TOPIRAMATE 25 MG PO TABS: 50.0000 mg | ORAL_TABLET | Freq: Every day | ORAL | Status: DC

## 2012-11-03 NOTE — Progress Notes (Signed)
Reason for visit: Pulsatile tinnitus  [name] is a 47 y.o. female  History of present illness:  [name] is a 47 year old right-handed black female with a history of pulsatile tinnitus in the right ear only over the last 2 years. The patient indicates that she will also occasionally have some headaches over the right frontotemporal area that are mild, occurring once or twice a week. The patient will occasionally have some nausea with the headache and some blurring of vision in the right eye. The patient does have some neck stiffness at times. The patient indicates that when she compresses the neck on the right side, the pulsatile tinnitus goes away. Otherwise, the pulsations are present at all times. The patient has been followed through Dr. Salvatore Marvel from optometry, and no evidence of papilledema has been noted in the past. The patient last had an examination several months ago. The patient has been seen by Dr. Dorma Russell, and a thorough evaluation was done including MRI of the brain, MRA of the head, and MRV of the head. These studies were unremarkable. The patient is sent to this office for an evaluation. The patient has not noted any other issues such as weakness of the extremities, but she does occasionally have some tingling into the right arm. The patient denies problems controlling the bowels or the bladder, and she denies balance issues.  Past Medical History  Diagnosis Date  . Hypertension   . Allergy   . Unspecified vitamin D deficiency   . Lumbago   . Obesity, unspecified   . Pulsatile tinnitus   . Gastroesophageal reflux disease     Past Surgical History  Procedure Laterality Date  . Gallbladder surgery    . Cesarean section      Family History  Problem Relation Age of Onset  . Cancer - Prostate Father   . Hypertension Mother   . Diabetes Mother     Social history:  reports that she has never smoked. She does not have any smokeless tobacco history on file.  She reports that she does not drink alcohol or use illicit drugs.  Medications:  Current Outpatient Prescriptions on File Prior to Visit  Medication Sig Dispense Refill  . benazepril-hydrochlorthiazide (LOTENSIN HCT) 20-25 MG per tablet Take 1 tablet by mouth daily.      . cetirizine (ZYRTEC) 10 MG tablet Take 10 mg by mouth daily.        . fluticasone (FLONASE) 50 MCG/ACT nasal spray 2 sprays by Nasal route daily.        Marland Kitchen HYDROcodone-acetaminophen (NORCO/VICODIN) 5-325 MG per tablet Take 1 tablet by mouth every 6 (six) hours as needed for pain.  15 tablet  0  . ibuprofen (ADVIL,MOTRIN) 800 MG tablet Take 1 tablet (800 mg total) by mouth every 8 (eight) hours as needed for pain.  21 tablet  0  . levalbuterol (XOPENEX HFA) 45 MCG/ACT inhaler Inhale 1-2 puffs into the lungs as needed.        Marland Kitchen OVER THE COUNTER MEDICATION Take 1 tablet by mouth daily as needed. OTC for pain       No current facility-administered medications on file prior to visit.    Allergies: No Known Allergies  ROS:  Out of a complete 14 system review of symptoms, the patient complains only of the following symptoms, and all other reviewed systems are negative.  Blurred vision, double vision Feeling hot Headache Sleepiness  Blood pressure 123/76, pulse 81, height 5\' 4"  (1.626 m),  weight 270 lb (122.471 kg).  Physical Exam  General: The patient is alert and cooperative at the time of the examination. The patient is morbidly obese.  Head: Pupils are equal, round, and reactive to light. Discs are flat bilaterally.  Neck: The neck is supple, no carotid bruits are noted.  Respiratory: The respiratory examination is clear.  Cardiovascular: The cardiovascular examination reveals a regular rate and rhythm, no obvious murmurs or rubs are noted.  Skin: Extremities are with 1+ edema at the ankles bilaterally.  Neurologic Exam  Mental status:  Cranial nerves: Facial symmetry is present. There is good sensation of  the face to pinprick and soft touch bilaterally. The strength of the facial muscles and the muscles to head turning and shoulder shrug are normal bilaterally. Speech is well enunciated, no aphasia or dysarthria is noted. Extraocular movements are full. Visual fields are full.  Motor: The motor testing reveals 5 over 5 strength of all 4 extremities. Good symmetric motor tone is noted throughout.  Sensory: Sensory testing is intact to pinprick, soft touch, vibration sensation, and position sense on all 4 extremities. No evidence of extinction is noted.  Coordination: Cerebellar testing reveals good finger-nose-finger and heel-to-shin bilaterally.  Gait and station: Gait is normal. Tandem gait is normal. Romberg is negative. No drift is seen.  Reflexes: Deep tendon reflexes are symmetric and normal bilaterally. Toes are downgoing bilaterally.   Assessment/Plan:  1. Pulsatile tinnitus  2. Morbid obesity  The patient does not have evidence of papilledema on clinical examination today. The patient does have pulsatile tinnitus on the right, and some intermittent headaches that may represent migraine. The patient will be placed on Topamax at this time to treat the pulsatile tinnitus and the headaches. The patient will contact me if she is not tolerating the medication. It is not clear that the patient has a pseudotumor syndrome. The fact that the patient can eliminate the pulsatile tinnitus by compression of the right neck suggests a venous source of the tinnitus. The patient will followup in 3 months.  Marlan Palau MD 11/03/2012 8:32 PM  [facility] [address] [place], [place] [address]  Phone [phone] Fax [phone]

## 2012-12-14 ENCOUNTER — Ambulatory Visit (INDEPENDENT_AMBULATORY_CARE_PROVIDER_SITE_OTHER): Payer: 59 | Admitting: Family Medicine

## 2012-12-14 ENCOUNTER — Ambulatory Visit (HOSPITAL_BASED_OUTPATIENT_CLINIC_OR_DEPARTMENT_OTHER)
Admission: RE | Admit: 2012-12-14 | Discharge: 2012-12-14 | Disposition: A | Discharge status: Home / Self Care | Payer: 59 | Source: Ambulatory Visit | Attending: Family Medicine | Admitting: Family Medicine

## 2012-12-14 ENCOUNTER — Encounter: Payer: Self-pay | Admitting: Family Medicine

## 2012-12-14 VITALS — BP 117/71 | HR 88 | Ht 64.0 in | Wt 270.0 lb

## 2012-12-14 DIAGNOSIS — M25562 Pain in left knee: Secondary | ICD-10-CM

## 2012-12-14 DIAGNOSIS — M79609 Pain in unspecified limb: Secondary | ICD-10-CM

## 2012-12-14 DIAGNOSIS — M25569 Pain in unspecified knee: Secondary | ICD-10-CM

## 2012-12-14 DIAGNOSIS — M79662 Pain in left lower leg: Secondary | ICD-10-CM

## 2012-12-14 NOTE — Patient Instructions (Addendum)
Get the x-rays and ultrasound downstairs - we will call you with the results. Take the medicine you're talking about that has tylenol and aspirin in it - 2 tabs three times a day with food. Glucosamine sulfate 750mg  twice a day is a supplement that may help moderate to severe arthritis. Capsaicin topically up to four times a day may also help with pain. Cortisone injections are an option. It's important that you continue to stay active. If you are overweight, try to lose weight through diet and exercise. Start calf raises, straight leg raises, knee extensions 3 sets of 10 - calf stretch 3 x 20 seconds once a day. Consider physical therapy to strengthen muscles around the joint that hurts to take pressure off of the joint itself. Shoe inserts with good arch support may be helpful. Heat or ice 15 minutes at a time 3-4 times a day as needed to help with pain. Water aerobics and cycling with low resistance are the best two types of exercise for arthritis.

## 2012-12-15 ENCOUNTER — Encounter: Payer: Self-pay | Admitting: Family Medicine

## 2012-12-15 ENCOUNTER — Ambulatory Visit: Payer: 59 | Admitting: Family Medicine

## 2012-12-15 DIAGNOSIS — M25562 Pain in left knee: Secondary | ICD-10-CM | POA: Insufficient documentation

## 2012-12-15 NOTE — Assessment & Plan Note (Signed)
doppler negative for DVT.  Patient's x-rays negative for arthritis or other bony abnormalities.  Believe this is a simple calf strain from overuse, being on feet a lot and walking at work.  Shown home exercise program here - to do this daily for next 6 weeks.  On phone discussed compression wrap.  Icing, tylenol/aspirin.  Should take about 3 weeks to resolve though can take up to 6 weeks.  F/u prn.

## 2012-12-15 NOTE — Progress Notes (Signed)
Patient ID: [name], female   DOB: October 11, 1965, 47 y.o.   MRN: [id number]  PCP: Willey Blade, MD  Subjective:   HPI: Patient is a 47 y.o. female here for left knee pain.  Patient reports she started developing posterior and medial left knee pain about a week ago. No known injury. Also reports some anterior knee pain. 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 Is on feet a lot, lots of walking with her job as well. Has tried aspirin, tylenol. No obvious increase in swelling (has some pitting edema at baseline). No warmth, redness that she has noticed. Pain goes into her left calf. No right sided pain.  Past Medical History  Diagnosis Date  . Hypertension   . Allergy   . Unspecified vitamin D deficiency   . Lumbago   . Obesity, unspecified   . Pulsatile tinnitus   . Gastroesophageal reflux disease     Current Outpatient Prescriptions on File Prior to Visit  Medication Sig Dispense Refill  . benazepril-hydrochlorthiazide (LOTENSIN HCT) 20-25 MG per tablet Take 1 tablet by mouth daily.      . cetirizine (ZYRTEC) 10 MG tablet Take 10 mg by mouth daily.        . fluticasone (FLONASE) 50 MCG/ACT nasal spray 2 sprays by Nasal route daily.        Marland Kitchen ibuprofen (ADVIL,MOTRIN) 800 MG tablet Take 1 tablet (800 mg total) by mouth every 8 (eight) hours as needed for pain.  21 tablet  0  . levalbuterol (XOPENEX HFA) 45 MCG/ACT inhaler Inhale 1-2 puffs into the lungs as needed.        Marland Kitchen OVER THE COUNTER MEDICATION Take 1 tablet by mouth daily as needed. OTC for pain      . topiramate (TOPAMAX) 25 MG tablet Take 2 tablets (50 mg total) by mouth at bedtime.  60 tablet  3  . HYDROcodone-acetaminophen (NORCO/VICODIN) 5-325 MG per tablet Take 1 tablet by mouth every 6 (six) hours as needed for pain.  15 tablet  0   No current facility-administered medications on file prior to visit.    Past Surgical History  Procedure Laterality Date  .  Gallbladder surgery    . Cesarean section      No Known Allergies  History   Social History  . Marital Status: Married    Spouse Name: N/A    Number of Children: N/A  . Years of Education: N/A   Occupational History  . Not on file.   Social History Main Topics  . Smoking status: Never Smoker   . Smokeless tobacco: Not on file  . Alcohol Use: No  . Drug Use: No  . Sexually Active: Yes    Birth Control/ Protection: Condom   Other Topics Concern  . Not on file   Social History Narrative  . No narrative on file    Family History  Problem Relation Age of Onset  . Cancer - Prostate Father   . Hypertension Father   . Hypertension Mother   . Diabetes Mother   . Heart attack Neg Hx   . Hyperlipidemia Neg Hx   . Sudden death Neg Hx     BP 117/71  Pulse 88  Ht 5\' 4"  (1.626 m)  Wt 270 lb (122.471 kg)  BMI 46.32 kg/m2  Review of Systems: See HPI above.    Objective:  Physical Exam:  Gen: NAD  L knee: No  gross deformity, ecchymoses, swelling.  No warmth, swelling, cords of calf. TTP medial left gastroc area, mild medial joint line TTP.  No lateral joint line, post patellar facet, other TTP. FROM. Negative ant/post drawers. Negative valgus/varus testing. Negative lachmanns. Negative mcmurrays, apleys, patellar apprehension. NV intact distally.    Assessment & Plan:  1. Left knee/calf pain - doppler negative for DVT.  Patient's x-rays negative for arthritis or other bony abnormalities.  Believe this is a simple calf strain from overuse, being on feet a lot and walking at work.  Shown home exercise program here - to do this daily for next 6 weeks.  On phone discussed compression wrap.  Icing, tylenol/aspirin.  Should take about 3 weeks to resolve though can take up to 6 weeks.  F/u prn.

## 2013-01-02 ENCOUNTER — Telehealth: Payer: Self-pay | Admitting: Neurology

## 2013-01-02 NOTE — Telephone Encounter (Signed)
Message copied by [facility] on Mon Jan 02, 2013  5:16 PM ------      Message from: Levander Campion E      Created: Mon Jan 02, 2013  1:03 PM      Contact: patient called       Patient calling anxious to get callback to previous message from this morning about clear drainage from nose-call [phone] or [phone] ------

## 2013-01-02 NOTE — Telephone Encounter (Signed)
Message copied by Ardeth Sportsman on Mon Jan 02, 2013  1:29 PM ------      Message from: Levander Campion E      Created: Mon Jan 02, 2013  1:03 PM      Contact: patient called       Patient calling anxious to get callback to previous message from this morning about clear drainage from nose-call [phone] or [phone] ------

## 2013-01-02 NOTE — Telephone Encounter (Signed)
I returned patient's call. She was not available. I spoke with her emergency contact, Alinda Money. He stated that she has been working with this clear nasal drainage for about a month. She doesn't have any other symptoms. They want to know if it's CSF. I suggested they follow up with their primary physician and they can test it with a swab and see if is just sinus drainage or CSF. From there, they can determine if aa neurology appointment is needed.

## 2013-01-02 NOTE — Telephone Encounter (Signed)
I agree with assessment. 

## 2013-01-09 ENCOUNTER — Other Ambulatory Visit: Payer: Self-pay | Admitting: Neurology

## 2013-01-09 NOTE — Progress Notes (Signed)
The patient has come in with a sample of fluid. The patient has rhinorrhea, and the concern is that this may represent spinal fluid. We will send the samples for glucose. If this is positive, the patient will have a CT scan with thin cuts of the sinuses, and she will have a referral to an ENT physician and a neurosurgeon.

## 2013-01-13 ENCOUNTER — Telehealth: Payer: Self-pay | Admitting: Neurology

## 2013-01-13 NOTE — Telephone Encounter (Signed)
I called the patient. The fluid analysis shows a glucose level of 53, suggestive of spinal fluid. I have called Dr. Newell Coral to discuss the case. He recommends that the patient first be evaluated through an ear nose and throat physician. I'll get a CT scan of the brain with thin cuts of the sinuses with bone windows. I called the patient to let me know if she has seen an ENT before. I left a message.

## 2013-01-16 ENCOUNTER — Telehealth: Payer: Self-pay | Admitting: Neurology

## 2013-01-16 NOTE — Telephone Encounter (Signed)
I called patient. The patient has glucose in her nasal discharge, suggesting a spinal fluid leak. She will have a CT of the sinuses in 2 days. The patient has seen Dr. Lajuana Ripple recently. I will have him see her again. The patient is to come off of the Topamax.

## 2013-01-16 NOTE — Telephone Encounter (Signed)
Pt called back and notes from 11/2012 pt was referred to [place] by Dr. Dorma Russell (ENT).  Although he is ear only.  She asked also about topamax (continue) taking.

## 2013-01-18 ENCOUNTER — Ambulatory Visit
Admission: RE | Admit: 2013-01-18 | Discharge: 2013-01-18 | Disposition: A | Discharge status: Home / Self Care | Payer: 59 | Source: Ambulatory Visit | Attending: Neurology | Admitting: Neurology

## 2013-01-18 ENCOUNTER — Telehealth: Payer: Self-pay | Admitting: Neurology

## 2013-01-18 ENCOUNTER — Other Ambulatory Visit: Payer: 59

## 2013-01-18 ENCOUNTER — Telehealth: Payer: Self-pay | Admitting: *Deleted

## 2013-01-18 DIAGNOSIS — R51 Headache: Secondary | ICD-10-CM

## 2013-01-18 DIAGNOSIS — H9319 Tinnitus, unspecified ear: Secondary | ICD-10-CM

## 2013-01-18 NOTE — Telephone Encounter (Signed)
I called patient. CT of the maxillofacial area did not show definite compromise of the bone around the sinuses. There was also no fluid level in any particular sinus. The patient will see Dr. Dorma Russell in the near future to determine the source of the spinal fluid leak.

## 2013-01-18 NOTE — Telephone Encounter (Signed)
[name] with [facility] calling about order for pt which needs to be placed as CT maxillofacial no contrast.  Order placed.

## 2013-01-25 ENCOUNTER — Other Ambulatory Visit (HOSPITAL_COMMUNITY): Payer: Self-pay | Admitting: Otolaryngology

## 2013-01-30 ENCOUNTER — Other Ambulatory Visit (HOSPITAL_COMMUNITY): Payer: 59

## 2013-01-30 ENCOUNTER — Ambulatory Visit (HOSPITAL_COMMUNITY)
Admission: RE | Admit: 2013-01-30 | Discharge: 2013-01-30 | Disposition: A | Discharge status: Home / Self Care | Payer: 59 | Source: Ambulatory Visit | Attending: Otolaryngology | Admitting: Otolaryngology

## 2013-01-30 ENCOUNTER — Other Ambulatory Visit (HOSPITAL_COMMUNITY): Payer: Self-pay | Admitting: Otolaryngology

## 2013-01-30 ENCOUNTER — Encounter (HOSPITAL_COMMUNITY): Payer: Self-pay | Admitting: Pharmacy Technician

## 2013-01-30 DIAGNOSIS — G9601 Cranial cerebrospinal fluid leak, spontaneous: Secondary | ICD-10-CM | POA: Insufficient documentation

## 2013-01-30 MED ORDER — ONDANSETRON HCL 4 MG/2ML IJ SOLN: 4.0000 mg | Freq: Four times a day (QID) | INTRAMUSCULAR | Status: DC | PRN

## 2013-01-30 MED ORDER — ACETAMINOPHEN 325 MG PO TABS
ORAL_TABLET | ORAL | Status: AC
  Administered 2013-01-30: 650 mg
  Filled 2013-01-30: qty 2

## 2013-01-30 MED ORDER — ACETAMINOPHEN 325 MG PO TABS
650.0000 mg | ORAL_TABLET | Freq: Once | ORAL | Status: DC
  Filled 2013-01-30: qty 2

## 2013-01-30 MED ORDER — IOHEXOL 180 MG/ML  SOLN
20.0000 mL | Freq: Once | INTRAMUSCULAR | Status: AC | PRN
  Administered 2013-01-30: 16 mL via INTRATHECAL

## 2013-01-30 MED ORDER — OXYCODONE-ACETAMINOPHEN 5-325 MG PO TABS: 1.0000 | ORAL_TABLET | Freq: Once | ORAL | Status: DC

## 2013-01-30 NOTE — Progress Notes (Signed)
Post myelogram and lumbar puncture discharge instructions reviewed with pt by Rodney Booze, RN.  Lumbar bandaid is CDI.  Headache relieved with tylenol.  Tolerated cisternogram. Aware of limited activity for next 24 hours.  D/c'd via wheelchair with husband.

## 2013-02-01 NOTE — Pre-Procedure Instructions (Signed)
[name]  02/01/2013   Your procedure is scheduled on:  Fri, Aug 1 @ 7:30 AM  Report to [facility] at 5:30 AM.  Call this number if you have problems the morning of surgery: [phone]   Remember:   Do not eat food or drink liquids after midnight.   Take these medicines the morning of surgery with A SIP OF WATER: Pepcid(Famotidine) and Zyrtec(Cetirizine)     Do not wear jewelry, make-up or nail polish.  Do not wear lotions, powders, or perfumes. You may wear deodorant.  Do not shave 48 hours prior to surgery.   Do not bring valuables to the hospital.  [facility] is not responsible                   for any belongings or valuables.  Contacts, dentures or bridgework may not be worn into surgery.  Leave suitcase in the car. After surgery it may be brought to your room.  For patients admitted to the hospital, checkout time is 11:00 AM the day of  discharge.   Patients discharged the day of surgery will not be allowed to drive  home.    Special Instructions: Shower using CHG 2 nights before surgery and the night before surgery.  If you shower the day of surgery use CHG.  Use special wash - you have one bottle of CHG for all showers.  You should use approximately 1/3 of the bottle for each shower.   Please read over the following fact sheets that you were given: Pain Booklet, Coughing and Deep Breathing and Surgical Site Infection Prevention

## 2013-02-02 ENCOUNTER — Encounter (HOSPITAL_COMMUNITY)
Admission: RE | Admit: 2013-02-02 | Discharge: 2013-02-02 | Disposition: A | Discharge status: Home / Self Care | Payer: 59 | Source: Ambulatory Visit | Attending: Otolaryngology | Admitting: Otolaryngology

## 2013-02-02 ENCOUNTER — Encounter (HOSPITAL_COMMUNITY)
Admission: RE | Admit: 2013-02-02 | Discharge: 2013-02-02 | Disposition: A | Discharge status: Home / Self Care | Payer: 59 | Source: Ambulatory Visit | Attending: Anesthesiology | Admitting: Anesthesiology

## 2013-02-02 ENCOUNTER — Encounter (HOSPITAL_COMMUNITY): Payer: Self-pay

## 2013-02-02 HISTORY — DX: Edema, unspecified: R60.9

## 2013-02-02 HISTORY — DX: Personal history of other diseases of the respiratory system: Z87.09

## 2013-02-02 HISTORY — DX: Family history of other specified conditions: Z84.89

## 2013-02-02 HISTORY — DX: Dermatitis, unspecified: L30.9

## 2013-02-02 HISTORY — DX: Dorsalgia, unspecified: M54.9

## 2013-02-02 HISTORY — DX: Headache: R51

## 2013-02-02 HISTORY — DX: Other constipation: K59.09

## 2013-02-02 HISTORY — DX: Localized edema: R60.0

## 2013-02-02 HISTORY — DX: Personal history of Methicillin resistant Staphylococcus aureus infection: Z86.14

## 2013-02-02 LAB — HCG, SERUM, QUALITATIVE: Preg, Serum: NEGATIVE

## 2013-02-02 LAB — BASIC METABOLIC PANEL
CO2: 29 mEq/L (ref 19–32)
Calcium: 9.5 mg/dL (ref 8.4–10.5)
GFR calc non Af Amer: >90 mL/min (ref >90)
Sodium: 137 mEq/L (ref 135–145)

## 2013-02-02 LAB — CBC
Platelets: 335 10*3/uL (ref 150–400)
RBC: 4.74 MIL/uL (ref 3.87–5.11)
WBC: 6.8 10*3/uL (ref 4.0–10.5)

## 2013-02-02 NOTE — Progress Notes (Addendum)
  Dr.Harwani is cardiologist-last visit about a month ago-to be requested   Dr.Eric August Saucer is Medical MD  Denies ever having a heart cath  Echo report in epic from 2005  Stress test report in epic from 2014  EKG in epic from 08-22-12  Denies cxr in past yr

## 2013-02-03 ENCOUNTER — Observation Stay (HOSPITAL_COMMUNITY): Payer: 59 | Admitting: Anesthesiology

## 2013-02-03 ENCOUNTER — Encounter (HOSPITAL_COMMUNITY): Payer: Self-pay | Admitting: *Deleted

## 2013-02-03 ENCOUNTER — Inpatient Hospital Stay (HOSPITAL_COMMUNITY)
Admission: RE | Admit: 2013-02-03 | Discharge: 2013-02-05 | DRG: 026 | Disposition: A | Discharge status: Home / Self Care | Payer: 59 | Source: Ambulatory Visit | Attending: Otolaryngology | Admitting: Otolaryngology

## 2013-02-03 ENCOUNTER — Encounter (HOSPITAL_COMMUNITY): Payer: Self-pay | Admitting: Anesthesiology

## 2013-02-03 ENCOUNTER — Encounter (HOSPITAL_COMMUNITY)
Admission: RE | Disposition: A | Discharge status: Home / Self Care | Payer: Self-pay | Source: Ambulatory Visit | Attending: Otolaryngology

## 2013-02-03 DIAGNOSIS — Z01818 Encounter for other preprocedural examination: Secondary | ICD-10-CM

## 2013-02-03 DIAGNOSIS — E669 Obesity, unspecified: Secondary | ICD-10-CM | POA: Diagnosis present

## 2013-02-03 DIAGNOSIS — H1130 Conjunctival hemorrhage, unspecified eye: Secondary | ICD-10-CM | POA: Diagnosis present

## 2013-02-03 DIAGNOSIS — Z01812 Encounter for preprocedural laboratory examination: Secondary | ICD-10-CM

## 2013-02-03 DIAGNOSIS — I10 Essential (primary) hypertension: Secondary | ICD-10-CM | POA: Diagnosis present

## 2013-02-03 DIAGNOSIS — Z6841 Body Mass Index (BMI) 40.0 and over, adult: Secondary | ICD-10-CM

## 2013-02-03 DIAGNOSIS — Z8614 Personal history of Methicillin resistant Staphylococcus aureus infection: Secondary | ICD-10-CM

## 2013-02-03 DIAGNOSIS — K219 Gastro-esophageal reflux disease without esophagitis: Secondary | ICD-10-CM | POA: Diagnosis present

## 2013-02-03 DIAGNOSIS — Z79899 Other long term (current) drug therapy: Secondary | ICD-10-CM

## 2013-02-03 DIAGNOSIS — G9601 Cranial cerebrospinal fluid leak, spontaneous: Principal | ICD-10-CM | POA: Diagnosis present

## 2013-02-03 DIAGNOSIS — J342 Deviated nasal septum: Secondary | ICD-10-CM | POA: Diagnosis present

## 2013-02-03 DIAGNOSIS — G932 Benign intracranial hypertension: Secondary | ICD-10-CM | POA: Diagnosis present

## 2013-02-03 HISTORY — PX: SINUS ENDO W/FUSION: SHX777

## 2013-02-03 SURGERY — SINUS SURGERY, ENDOSCOPIC, USING COMPUTER-ASSISTED NAVIGATION
Anesthesia: General | Site: Nose | Wound class: Clean Contaminated

## 2013-02-03 MED ORDER — ACETAMINOPHEN 325 MG PO TABS: 650.0000 mg | ORAL_TABLET | Freq: Four times a day (QID) | ORAL | Status: DC | PRN

## 2013-02-03 MED ORDER — PROPOFOL 10 MG/ML IV BOLUS
INTRAVENOUS | Status: DC | PRN
  Administered 2013-02-03: 120 mg via INTRAVENOUS

## 2013-02-03 MED ORDER — MUPIROCIN CALCIUM 2 % EX CREA
TOPICAL_CREAM | CUTANEOUS | Status: AC
  Filled 2013-02-03: qty 15

## 2013-02-03 MED ORDER — FENTANYL CITRATE 0.05 MG/ML IJ SOLN
INTRAMUSCULAR | Status: DC | PRN
  Administered 2013-02-03 (×9): 50 ug via INTRAVENOUS

## 2013-02-03 MED ORDER — PNEUMOCOCCAL VAC POLYVALENT 25 MCG/0.5ML IJ INJ
0.5000 mL | INJECTION | Freq: Once | INTRAMUSCULAR | Status: AC
  Administered 2013-02-03: 0.5 mL via INTRAMUSCULAR
  Filled 2013-02-03: qty 0.5

## 2013-02-03 MED ORDER — GLYCOPYRROLATE 0.2 MG/ML IJ SOLN
INTRAMUSCULAR | Status: DC | PRN
  Administered 2013-02-03: .7 mg via INTRAVENOUS

## 2013-02-03 MED ORDER — LIDOCAINE-EPINEPHRINE 0.5 %-1:200000 IJ SOLN
INTRAMUSCULAR | Status: AC
  Filled 2013-02-03: qty 1

## 2013-02-03 MED ORDER — SODIUM CHLORIDE 0.9 % IV SOLN
10.0000 mg | INTRAVENOUS | Status: DC | PRN
  Administered 2013-02-03: 5 ug/min via INTRAVENOUS

## 2013-02-03 MED ORDER — FLUORESCEIN SODIUM 1 MG OP STRP
ORAL_STRIP | OPHTHALMIC | Status: DC | PRN
  Administered 2013-02-03: 2

## 2013-02-03 MED ORDER — KCL IN DEXTROSE-NACL 20-5-0.45 MEQ/L-%-% IV SOLN
INTRAVENOUS | Status: AC
  Filled 2013-02-03: qty 1000

## 2013-02-03 MED ORDER — LIDOCAINE HCL 4 % MT SOLN
OROMUCOSAL | Status: DC | PRN
  Administered 2013-02-03: 4 mL via TOPICAL

## 2013-02-03 MED ORDER — OXYCODONE HCL 5 MG/5ML PO SOLN: 5.0000 mg | Freq: Once | ORAL | Status: AC | PRN

## 2013-02-03 MED ORDER — OXYMETAZOLINE HCL 0.05 % NA SOLN
NASAL | Status: AC
  Filled 2013-02-03: qty 15

## 2013-02-03 MED ORDER — DOCUSATE SODIUM 100 MG PO CAPS
100.0000 mg | ORAL_CAPSULE | Freq: Two times a day (BID) | ORAL | Status: DC
  Administered 2013-02-03 – 2013-02-05 (×5): 100 mg via ORAL
  Filled 2013-02-03 (×5): qty 1

## 2013-02-03 MED ORDER — HYDROMORPHONE HCL PF 1 MG/ML IJ SOLN
0.2500 mg | INTRAMUSCULAR | Status: DC | PRN
  Administered 2013-02-03 (×3): 0.5 mg via INTRAVENOUS

## 2013-02-03 MED ORDER — ONDANSETRON HCL 4 MG/2ML IJ SOLN: 4.0000 mg | Freq: Once | INTRAMUSCULAR | Status: DC | PRN

## 2013-02-03 MED ORDER — ARTIFICIAL TEARS OP OINT
TOPICAL_OINTMENT | OPHTHALMIC | Status: DC | PRN
  Administered 2013-02-03: 1 via OPHTHALMIC

## 2013-02-03 MED ORDER — MORPHINE SULFATE 2 MG/ML IJ SOLN
1.0000 mg | INTRAMUSCULAR | Status: DC | PRN
  Administered 2013-02-03 – 2013-02-05 (×7): 1 mg via INTRAVENOUS
  Filled 2013-02-03 (×7): qty 1

## 2013-02-03 MED ORDER — MENINGOCOCCAL VAC A,C,Y,W-135 ~~LOC~~ INJ
0.5000 mL | INJECTION | Freq: Once | SUBCUTANEOUS | Status: AC
  Administered 2013-02-03: 0.5 mL via SUBCUTANEOUS
  Filled 2013-02-03 (×2): qty 0.5

## 2013-02-03 MED ORDER — HYDROMORPHONE HCL PF 1 MG/ML IJ SOLN
INTRAMUSCULAR | Status: AC
  Filled 2013-02-03: qty 1

## 2013-02-03 MED ORDER — CLINDAMYCIN PHOSPHATE 600 MG/50ML IV SOLN: 600.0000 mg | Freq: Once | INTRAVENOUS | Status: DC

## 2013-02-03 MED ORDER — ACETAZOLAMIDE ER 500 MG PO CP12
500.0000 mg | ORAL_CAPSULE | Freq: Two times a day (BID) | ORAL | Status: DC
  Administered 2013-02-03 – 2013-02-05 (×5): 500 mg via ORAL
  Filled 2013-02-03 (×6): qty 1

## 2013-02-03 MED ORDER — ROCURONIUM BROMIDE 100 MG/10ML IV SOLN
INTRAVENOUS | Status: DC | PRN
  Administered 2013-02-03 (×2): 10 mg via INTRAVENOUS
  Administered 2013-02-03: 50 mg via INTRAVENOUS
  Administered 2013-02-03: 5 mg via INTRAVENOUS
  Administered 2013-02-03 (×2): 10 mg via INTRAVENOUS

## 2013-02-03 MED ORDER — LIDOCAINE HCL (CARDIAC) 20 MG/ML IV SOLN
INTRAVENOUS | Status: DC | PRN
  Administered 2013-02-03: 80 mg via INTRAVENOUS

## 2013-02-03 MED ORDER — LIDOCAINE-EPINEPHRINE 1 %-1:100000 IJ SOLN
INTRAMUSCULAR | Status: DC | PRN
  Administered 2013-02-03: 4 mL

## 2013-02-03 MED ORDER — CLINDAMYCIN PHOSPHATE 600 MG/50ML IV SOLN
600.0000 mg | Freq: Three times a day (TID) | INTRAVENOUS | Status: DC
  Administered 2013-02-03 – 2013-02-05 (×7): 600 mg via INTRAVENOUS
  Filled 2013-02-03 (×8): qty 50

## 2013-02-03 MED ORDER — CLINDAMYCIN PHOSPHATE 600 MG/50ML IV SOLN
INTRAVENOUS | Status: AC
  Administered 2013-02-03: 600 mg via INTRAVENOUS
  Filled 2013-02-03: qty 50

## 2013-02-03 MED ORDER — MEPERIDINE HCL 25 MG/ML IJ SOLN: 6.2500 mg | INTRAMUSCULAR | Status: DC | PRN

## 2013-02-03 MED ORDER — ONDANSETRON HCL 4 MG/2ML IJ SOLN
INTRAMUSCULAR | Status: DC | PRN
  Administered 2013-02-03: 4 mg via INTRAVENOUS

## 2013-02-03 MED ORDER — FLUORESCEIN SODIUM 1 MG OP STRP
ORAL_STRIP | OPHTHALMIC | Status: AC
  Filled 2013-02-03: qty 2

## 2013-02-03 MED ORDER — DIPHENHYDRAMINE HCL 12.5 MG/5ML PO ELIX: 12.5000 mg | ORAL_SOLUTION | Freq: Four times a day (QID) | ORAL | Status: DC | PRN

## 2013-02-03 MED ORDER — HYDRALAZINE HCL 20 MG/ML IJ SOLN: 10.0000 mg | Freq: Three times a day (TID) | INTRAMUSCULAR | Status: DC | PRN

## 2013-02-03 MED ORDER — PHENYLEPHRINE HCL 10 MG/ML IJ SOLN: 10.0000 mg | INTRAVENOUS | Status: DC | PRN

## 2013-02-03 MED ORDER — OXYCODONE HCL 5 MG PO TABS
10.0000 mg | ORAL_TABLET | ORAL | Status: DC | PRN
  Administered 2013-02-03 – 2013-02-05 (×9): 10 mg via ORAL
  Filled 2013-02-03 (×9): qty 2

## 2013-02-03 MED ORDER — ZOLPIDEM TARTRATE 5 MG PO TABS: 5.0000 mg | ORAL_TABLET | Freq: Every evening | ORAL | Status: DC | PRN

## 2013-02-03 MED ORDER — DIPHENHYDRAMINE HCL 50 MG/ML IJ SOLN: 12.5000 mg | Freq: Four times a day (QID) | INTRAMUSCULAR | Status: DC | PRN

## 2013-02-03 MED ORDER — BACITRACIN ZINC 500 UNIT/GM EX OINT: TOPICAL_OINTMENT | CUTANEOUS | Status: DC | PRN

## 2013-02-03 MED ORDER — OXYCODONE HCL 5 MG PO TABS
ORAL_TABLET | ORAL | Status: AC
  Filled 2013-02-03: qty 1

## 2013-02-03 MED ORDER — MIDAZOLAM HCL 5 MG/5ML IJ SOLN
INTRAMUSCULAR | Status: DC | PRN
  Administered 2013-02-03: 2 mg via INTRAVENOUS

## 2013-02-03 MED ORDER — LIDOCAINE-EPINEPHRINE 1 %-1:100000 IJ SOLN
INTRAMUSCULAR | Status: AC
  Filled 2013-02-03: qty 1

## 2013-02-03 MED ORDER — LACTATED RINGERS IV SOLN
INTRAVENOUS | Status: DC | PRN
  Administered 2013-02-03 (×3): via INTRAVENOUS

## 2013-02-03 MED ORDER — OXYCODONE HCL 5 MG PO TABS
5.0000 mg | ORAL_TABLET | Freq: Once | ORAL | Status: AC | PRN
  Administered 2013-02-03: 5 mg via ORAL

## 2013-02-03 MED ORDER — SODIUM CHLORIDE 0.9 % IR SOLN
Status: DC | PRN
  Administered 2013-02-03 (×4): 1000 mL

## 2013-02-03 MED ORDER — ACETAMINOPHEN 650 MG RE SUPP: 650.0000 mg | Freq: Four times a day (QID) | RECTAL | Status: DC | PRN

## 2013-02-03 MED ORDER — MUPIROCIN CALCIUM 2 % NA OINT
TOPICAL_OINTMENT | NASAL | Status: DC | PRN
  Administered 2013-02-03: 1 via NASAL

## 2013-02-03 MED ORDER — KCL IN DEXTROSE-NACL 20-5-0.45 MEQ/L-%-% IV SOLN
INTRAVENOUS | Status: DC
  Administered 2013-02-03: 75 mL via INTRAVENOUS
  Filled 2013-02-03 (×2): qty 1000

## 2013-02-03 MED ORDER — ONDANSETRON HCL 4 MG/2ML IJ SOLN
4.0000 mg | Freq: Four times a day (QID) | INTRAMUSCULAR | Status: DC | PRN
  Administered 2013-02-03 – 2013-02-05 (×7): 4 mg via INTRAVENOUS
  Filled 2013-02-03 (×7): qty 2

## 2013-02-03 MED ORDER — OXYMETAZOLINE HCL 0.05 % NA SOLN
NASAL | Status: DC | PRN
  Administered 2013-02-03: 1

## 2013-02-03 MED ORDER — PHENYLEPHRINE HCL 10 MG/ML IJ SOLN
INTRAMUSCULAR | Status: DC | PRN
  Administered 2013-02-03 (×2): 40 ug via INTRAVENOUS

## 2013-02-03 MED ORDER — BACITRACIN ZINC 500 UNIT/GM EX OINT
TOPICAL_OINTMENT | CUTANEOUS | Status: AC
  Filled 2013-02-03: qty 15

## 2013-02-03 MED ORDER — NEOSTIGMINE METHYLSULFATE 1 MG/ML IJ SOLN
INTRAMUSCULAR | Status: DC | PRN
  Administered 2013-02-03: 5 mg via INTRAVENOUS

## 2013-02-03 MED ORDER — PHENOL 1.4 % MT LIQD: 2.0000 | Freq: Three times a day (TID) | OROMUCOSAL | Status: DC | PRN

## 2013-02-03 SURGICAL SUPPLY — 69 items
ALLODERM TISSUE 4X16CM THICK (Tissue) ×3 IMPLANT
APPLICATOR COTTON TIP 6IN STRL (MISCELLANEOUS) ×6 IMPLANT
BENZOIN TINCTURE PRP APPL 2/3 (GAUZE/BANDAGES/DRESSINGS) ×3 IMPLANT
BLADE RAD60 ROTATE M4 4 5PK (BLADE) ×3 IMPLANT
BLADE ROTATE TRICUT 4X13 M4 (BLADE) ×3 IMPLANT
BLADE SURG 15 STRL LF DISP TIS (BLADE) ×2 IMPLANT
BLADE SURG 15 STRL SS (BLADE) ×1
BUR DIAMOND 13X5 70D (BURR) IMPLANT
BUR DIAMOND CURV 15X5 15D (BURR) IMPLANT
CANISTER SUCTION 1200CC (MISCELLANEOUS) IMPLANT
CANISTER SUCTION 2500CC (MISCELLANEOUS) ×3 IMPLANT
CLOTH BEACON ORANGE TIMEOUT ST (SAFETY) ×3 IMPLANT
CLSR STERI-STRIP ANTIMIC 1/2X4 (GAUZE/BANDAGES/DRESSINGS) ×3 IMPLANT
COAGULATOR SUCT SWTCH 10FR 6 (ELECTROSURGICAL) ×3 IMPLANT
CONT SPEC 4OZ CLIKSEAL STRL BL (MISCELLANEOUS) IMPLANT
CORDS BIPOLAR (ELECTRODE) ×3 IMPLANT
COTTONBALL LRG STERILE PKG (GAUZE/BANDAGES/DRESSINGS) ×3 IMPLANT
CRADLE DONUT ADULT HEAD (MISCELLANEOUS) ×3 IMPLANT
DECANTER SPIKE VIAL GLASS SM (MISCELLANEOUS) ×3 IMPLANT
DRESSING NASAL POPE 10X1.5X2.5 (GAUZE/BANDAGES/DRESSINGS) ×2 IMPLANT
DRSG NASAL POPE 10X1.5X2.5 (GAUZE/BANDAGES/DRESSINGS) ×3
DRSG NASOPORE 8CM (GAUZE/BANDAGES/DRESSINGS) ×3 IMPLANT
DURASEAL SPINE SEALANT 3ML (MISCELLANEOUS) ×3 IMPLANT
ELECT COATED BLADE 2.86 ST (ELECTRODE) ×3 IMPLANT
ELECT NEEDLE TIP 2.8 STRL (NEEDLE) ×3 IMPLANT
ELECT REM PT RETURN 9FT ADLT (ELECTROSURGICAL) ×3
ELECTRODE REM PT RTRN 9FT ADLT (ELECTROSURGICAL) ×2 IMPLANT
FILTER ARTHROSCOPY CONVERTOR (FILTER) ×6 IMPLANT
GAUZE SPONGE 4X4 16PLY XRAY LF (GAUZE/BANDAGES/DRESSINGS) IMPLANT
GAUZE XEROFORM 5X9 LF (GAUZE/BANDAGES/DRESSINGS) IMPLANT
GLOVE BIO SURGEON STRL SZ7.5 (GLOVE) ×3 IMPLANT
GLOVE BIOGEL PI IND STRL 6 (GLOVE) ×4 IMPLANT
GLOVE BIOGEL PI INDICATOR 6 (GLOVE) ×2
GLOVE SURG SS PI 6.5 STRL IVOR (GLOVE) ×6 IMPLANT
GLOVE SURG SS PI 7.0 STRL IVOR (GLOVE) ×3 IMPLANT
GLOVE SURG SS PI 7.5 STRL IVOR (GLOVE) ×6 IMPLANT
GOWN STRL NON-REIN LRG LVL3 (GOWN DISPOSABLE) ×12 IMPLANT
HEMOSTAT SURGICEL 2X3 (HEMOSTASIS) ×3 IMPLANT
KIT BASIN OR (CUSTOM PROCEDURE TRAY) ×6 IMPLANT
KIT ROOM TURNOVER OR (KITS) ×3 IMPLANT
MARKER SKIN DUAL TIP RULER LAB (MISCELLANEOUS) ×3 IMPLANT
NEEDLE 27GAX1X1/2 (NEEDLE) ×3 IMPLANT
NEEDLE HYPO 25X1 1.5 SAFETY (NEEDLE) ×3 IMPLANT
NEEDLE SPNL 20GX3.5 QUINCKE YW (NEEDLE) ×3 IMPLANT
NS IRRIG 1000ML POUR BTL (IV SOLUTION) ×6 IMPLANT
PACK EENT II TURBAN DRAPE (CUSTOM PROCEDURE TRAY) ×3 IMPLANT
PAD ARMBOARD 7.5X6 YLW CONV (MISCELLANEOUS) ×6 IMPLANT
PAD ENT ADHESIVE 25PK (MISCELLANEOUS) ×3 IMPLANT
PATTIES SURGICAL .5 X3 (DISPOSABLE) ×3 IMPLANT
PENCIL BUTTON HOLSTER BLD 10FT (ELECTRODE) ×3 IMPLANT
SHEATH ENDOSCRUB 30 DEG (SHEATH) ×3 IMPLANT
SHEATH ENDOSCRUB 45 DEG (SHEATH) ×3 IMPLANT
SOLUTION ANTI FOG 6CC (MISCELLANEOUS) ×3 IMPLANT
SPECIMEN JAR SMALL (MISCELLANEOUS) ×6 IMPLANT
STRIP CLOSURE SKIN 1/2X4 (GAUZE/BANDAGES/DRESSINGS) ×3 IMPLANT
SUT SILK 2 0 SH (SUTURE) ×3 IMPLANT
SYR BULB 3OZ (MISCELLANEOUS) ×3 IMPLANT
SYR CONTROL 10ML LL (SYRINGE) ×3 IMPLANT
SYRINGE 10CC LL (SYRINGE) ×3 IMPLANT
TOWEL OR 17X26 10 PK STRL BLUE (TOWEL DISPOSABLE) ×3 IMPLANT
TRACKER ENT INSTRUMENT (MISCELLANEOUS) ×3 IMPLANT
TRACKER ENT PATIENT (MISCELLANEOUS) ×3 IMPLANT
TRAY ENT MC OR (CUSTOM PROCEDURE TRAY) ×3 IMPLANT
TRAY FOLEY CATH 16FR SILVER (SET/KITS/TRAYS/PACK) ×3 IMPLANT
TUBE CONNECTING 12X1/4 (SUCTIONS) ×3 IMPLANT
TUBE CONNECTING 20X1/4 (TUBING) ×3 IMPLANT
TUBING STRAIGHTSHOT EPS 5PK (TUBING) ×3 IMPLANT
WATER STERILE IRR 1000ML POUR (IV SOLUTION) ×3 IMPLANT
WIPE INSTRUMENT VISIWIPE 73X73 (MISCELLANEOUS) ×3 IMPLANT

## 2013-02-03 NOTE — Op Note (Signed)
10:54 AM 02/03/2013  Surgeon: Melvenia Beam  Procedures Performed: [phone] endoscopic repair of ethmoid/cribriform plate CSF leak 98119-J right middle turbinate mucosal graft for repair of right cribriform CSF leak 15275-R right skull base/cribriform plate Alloderm graft for repair of cribriform CSF leak 61782-CT image guidance 31255-R right total ethmoidectomies 31256-R right maxillary antrostomy without tissue removal 31276-R right Draf 2B frontal sinusotomy 31287-R right sphenoidotomy without tissue removal  Operative Findings: normal sinus anatomy, right septal deviation, right cribriform plate/root of middle turbinate CSF leak-fluorescein positive repaired with alloderm/middle turbinate mucosal graft with >100% defect coverage. No skull base, optic nerve, carotid, ethmoid artery, or orbital injury  Specimens: right and left sinus contents, right maxillary sinus mass for frozen and permanent.  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    ANESTHESIA: General endotracheal.  ESTIMATED BLOOD LOSS: Less than 250 mL.  HISTORY OF PRESENT ILLNESS: The patient is a  47yo female with right cribriform plate/root of middle turbinate CSF leak-fluorescein positive/right CSF rhinorrhea and idiopathic intracranial hypertension.  PROCEDURE: The patient was brought to the operating room and placed in the supine position. After adequate endotracheal anesthesia was obtained, the skin was draped in sterile fashion. Lidocaine 1% with 1:100,000 epinephrine was injected into the bilateral greater palatine foramina transorally.    The patient was registered to the Fusion medtronic image guidance system/MRI in the standard fashion and  used throughout the case for identification of the skull base/orbit and other important anatomy. Attention then was directed toward the right sinuses. Lidocaine 1% with 1:100,000 epinephrine was injected in the region of the anterior portion of the right uncinate process and sphenopalatine area. The right middle turbinate was removed using the straight thru-cut and placed in saline. Its pedicle was cauterized with the Bovie. The uncinate process was identified using the 0 degree endoscope and the right uncinate process was  removed systematically superiorly to inferiorly with back-biting forceps. Next, the right maxillary sinus was identified and the medial wall of the right maxillary sinus was widely opened using the microdebrider. I then switched to the 45 degree scope and inspected the right maxillary sinus. The right maxillary sinus natural ostium was widely opened using the backbiter, 90 degree blakesley, and the microdebrider.  The anterior and posterior ethmoid right air cells were entered after identifying the right ethmoid bulla using the image guidance suction and dissected up to the skull base and out to the lamina papyrecea using the 55 degree curette and the 3mm Kerrison punch. All of the ethmoid cells were meticulously dissected out using the Kerrison and debrider. The skull base and lamina papyracea were preserved throughout.   The right sphenoid natural ostium was then identified using the image guidance suction and the right sphenoid was widely opened using the 3mm Kerrison all the way to the skull base and lamina papyracea. The right sphenoid was widely opened using the 3mm Kerrison. No evidence of encephalocele or CSF rhinorrhea was seen in the sphenoid sinus.  Next I switched back to the 45 degree endoscope, 60 degree microdebrider, and curved image guidance suction. The skull base was identified and dissected anteriorly using the 90 degree Kuhn-Bolger curette. The Agger Nasi cell was taken  down and then the right frontal sinus was widely opened in a Draf 2B fashion  using the curved debrider, Stammberger 4mm frontal mushroom punch,  and the 90 degree curette. The remainder of the superior root of the middle turbinate was then carefully removed using the side to side Kuhn frontal cutting forceps until it was flush with the skull base/lateral lamella of the cribriform plate.  I then placed topical flourescein on the cribriform plate. This turned from orange to green at the root of the middle turbinate/lateral lamella of the cribriform plate, indicative of CSF leak from this area consistent with the CT cisternogram. I prepared the defect area by removing the mucosa from the cribriform plate and the ethmoid roof, and then carefully cauterized the root of the middle turbinate/lateral cribriform using the bipolar.  Next I prepared a 4x2cm alloderm graft and placed this over the entire ethmoid roof/lateral cribriform plate. I then placed the mucosa from the right middle turbinate, with the mucosal side facing out, over the alloderm graft. >100% defect coverage was noted and the CSF leak stopped after the area was cauterized. I took care not to obstruct the right frontal or sphenoid sinusotomies.  I then placed a large sheet of surgicel over the repair, followed by Duraseal, then nasopore, then a  1 x 8 cm merocel sponge to hold the repair in place.   A thorough irrigation was then carried out in the nasal cavity, the right  Merocel  spacer was secured to the patient's cheek on the right. The patient's stomach was suctioned out using a flexible OGtube. The patient was awakened from anesthesia and extubated without difficulty. The patient tolerated the procedure well and returned to the recovery room in stable condition.   Dr. Melvenia Beam was present and performed the entire procedure. 10:54 AM Melvenia Beam 02/03/2013

## 2013-02-03 NOTE — Progress Notes (Signed)
PT STATES PAIN IS A 5/10 BUT ALWAYS SLEEPING EASILY AROUSES AND FALLS BACK TO SLEEP IF LEFT ALONE

## 2013-02-03 NOTE — Progress Notes (Signed)
Patient ID: [name], female   DOB: 12-30-1965, 47 y.o.   MRN: [id number] Complains of head pressure and bloody sputum.  Otherwise, doing fine. No active bleeding.  Right nasal pack in place.  Normal orientation. A/P: S/P endoscopic repair of CSF rhinorrhea Observe overnight.  Pack in place.  Started Diamox.  Labs planned for tomorrow morning.

## 2013-02-03 NOTE — Anesthesia Procedure Notes (Addendum)
Procedure Name: Intubation Date/Time: 02/03/2013 7:40 AM Performed by: Armandina Gemma Pre-anesthesia Checklist: Patient identified, Emergency Drugs available, Suction available, Patient being monitored and Timeout performed Patient Re-evaluated:Patient Re-evaluated prior to inductionOxygen Delivery Method: Circle system utilized Preoxygenation: Pre-oxygenation with 100% oxygen Intubation Type: IV induction Ventilation: Mask ventilation with difficulty Laryngoscope Size: Mac and 3 Grade View: Grade II Tube type: Oral Tube size: 7.0 mm Number of attempts: 1 Airway Equipment and Method: Stylet and LTA kit utilized Placement Confirmation: ETT inserted through vocal cords under direct vision,  positive ETCO2,  CO2 detector and breath sounds checked- equal and bilateral Secured at: 23 cm Tube secured with: Tape Dental Injury: Teeth and Oropharynx as per pre-operative assessment    ll

## 2013-02-03 NOTE — Anesthesia Preprocedure Evaluation (Signed)
Anesthesia Evaluation  Patient identified by MRN, date of birth, ID band Patient awake    Reviewed: Allergy & Precautions, H&P , NPO status , Patient's Chart, lab work & pertinent test results  Airway Mallampati: I TM Distance: >3 FB Neck ROM: Full    Dental   Pulmonary          Cardiovascular hypertension, Pt. on medications     Neuro/Psych  Headaches,    GI/Hepatic GERD-  Medicated and Controlled,  Endo/Other    Renal/GU      Musculoskeletal   Abdominal   Peds  Hematology   Anesthesia Other Findings   Reproductive/Obstetrics                           Anesthesia Physical Anesthesia Plan  ASA: III  Anesthesia Plan: General   Post-op Pain Management:    Induction: Intravenous  Airway Management Planned: Oral ETT  Additional Equipment:   Intra-op Plan:   Post-operative Plan: Extubation in OR  Informed Consent: I have reviewed the patients History and Physical, chart, labs and discussed the procedure including the risks, benefits and alternatives for the proposed anesthesia with the patient or authorized representative who has indicated his/her understanding and acceptance.     Plan Discussed with: CRNA and Surgeon  Anesthesia Plan Comments:         Anesthesia Quick Evaluation

## 2013-02-03 NOTE — Transfer of Care (Signed)
Immediate Anesthesia Transfer of Care Note  Patient: [name]  Procedure(s) Performed: Procedure(s) with comments: ENDOSCOPIC SINUS SURGERY WITH FUSION NAVIGATION (Bilateral) - Repaired CSF Leak  Patient Location: PACU  Anesthesia Type:General  Level of Consciousness: sedated  Airway & Oxygen Therapy: Patient Spontanous Breathing and Patient connected to nasal cannula oxygen  Post-op Assessment: Report given to PACU RN and Post -op Vital signs reviewed and stable  Post vital signs: Reviewed and stable  Complications: No apparent anesthesia complications

## 2013-02-03 NOTE — H&P (Signed)
02/03/2013  [name]  PREOPERATIVE HISTORY AND PHYSICAL  CHIEF COMPLAINT: right cribriform plate CSF leak/CSF rhinorrhea, idiopathic intracranial hypertension  HISTORY: This is a 47 year old who presents with right cribriform plate CSF leak/CSF rhinorrhea, idiopathic intracranial hypertension. She now presents for endoscopic endonasal right sinus surgery and endoscopic repair of a right cribriform plate CSF leak.  Dr. Emeline Darling, [place] has discussed the risks (bleeding, infection, orbital or skull base injury, recurrent CSF leak, meningitis, metachronous CSF leak), benefits, and alternatives of this procedure. The patient understands the risks and would like to proceed with the procedure. The chances of success of the procedure are >50% and the patient understands this. I personally performed an examination of the patient within 24 hours of the procedure.  PAST MEDICAL HISTORY: Past Medical History  Diagnosis Date  . Allergy     takes Zyrtec daily  . Unspecified vitamin D deficiency   . Lumbago   . Obesity, unspecified   . Pulsatile tinnitus   . Gastroesophageal reflux disease     takes Pepcid daily  . Hypertension     takes Lotensin daily  . Family history of anesthesia complication     mother got confused some after anesthesia  . Peripheral edema   . History of bronchitis     pt states a very long time ago  . Headache(784.0)     benign intercranial HTN d/t CSF leak  . Back pain     arthritis  . Eczema   . Chronic constipation     takes Stool Softener  . History of MRSA infection     > 69yrs ago    PAST SURGICAL HISTORY: Past Surgical History  Procedure Laterality Date  . Gallbladder surgery  29yrs ago  . Cesarean section  60yrs ago    MEDICATIONS: No current facility-administered medications on file prior to encounter.   Current Outpatient Prescriptions on File Prior to Encounter  Medication Sig Dispense Refill  . benazepril-hydrochlorthiazide (LOTENSIN HCT) 20-25  MG per tablet Take 1 tablet by mouth daily.      . cetirizine (ZYRTEC) 10 MG tablet Take 10 mg by mouth daily.         ALLERGIES: Allergies  Allergen Reactions  . Codeine Nausea And Vomiting  . Hydrocodone Itching    SOCIAL HISTORY: History   Social History  . Marital Status: Married    Spouse Name: N/A    Number of Children: N/A  . Years of Education: N/A   Occupational History  . Not on file.   Social History Main Topics  . Smoking status: Never Smoker   . Smokeless tobacco: Not on file  . Alcohol Use: No  . Drug Use: No  . Sexually Active: Yes   Other Topics Concern  . Not on file   Social History Narrative  . No narrative on file    FAMILY HISTORY: Family History  Problem Relation Age of Onset  . Cancer - Prostate Father   . Hypertension Father   . Hypertension Mother   . Diabetes Mother   . Heart attack Neg Hx   . Hyperlipidemia Neg Hx   . Sudden death Neg Hx     REVIEW OF SYSTEMS:  HEENT: right nasal CSF rhinorrhea, pulsatile tinnitus, occasional headaches, otherwise negative x 10 systems except per HPI   PHYSICAL EXAM:  GENERAL:  NAD VITAL SIGNS:   Filed Vitals:   02/03/13 0616  BP: 160/99  Pulse: 72  Temp: 97.8 F (36.6 C)  Resp: 20  SKIN:  Warm, dry HEENT:  Oral cavity clear NECK:  supple LYMPH:  No LAD LUNGS:  Grossly clear CARDIOVASCULAR:  RRR ABDOMEN:  Soft, NT MUSCULOSKELETAL: normal strength PSYCH:  Normal affect NEUROLOGIC:  CN 2-12 intact and symmetric  DIAGNOSTIC STUDIES: CT cisternogram shows CSF/contrast extravasation from right cribriform plate, beta-transferrin positive from right clear nasal drainage  ASSESSMENT AND PLAN: Plan to proceed with right endoscopic endonasal repair of right cribriform plate CSF leak with right middle turbinate graft. Will monitor postoperatively overnight and start acetazolamide BID, monitor potassium. Patient understands the risks, benefits, and alternatives. Informed written consent  signed, witnessed and on chart. 02/03/2013  6:41 AM [name]

## 2013-02-03 NOTE — Anesthesia Postprocedure Evaluation (Signed)
  Anesthesia Post-op Note  Patient: [name]  Procedure(s) Performed: Procedure(s) with comments: ENDOSCOPIC SINUS SURGERY WITH FUSION NAVIGATION (Bilateral) - Repaired CSF Leak  Patient Location: PACU  Anesthesia Type:General  Level of Consciousness: awake  Airway and Oxygen Therapy: Patient Spontanous Breathing  Post-op Pain: mild  Post-op Assessment: Post-op Vital signs reviewed  Post-op Vital Signs: stable  Complications: No apparent anesthesia complications

## 2013-02-03 NOTE — Progress Notes (Signed)
UR COMPLETED  

## 2013-02-04 LAB — COMPREHENSIVE METABOLIC PANEL
AST: 16 U/L (ref 0–37)
Albumin: 3.2 g/dL — ABNORMAL LOW (ref 3.5–5.2)
Alkaline Phosphatase: 76 U/L (ref 39–117)
BUN: 7 mg/dL (ref 6–23)
Chloride: 101 mEq/L (ref 96–112)
Potassium: 3.2 mEq/L — ABNORMAL LOW (ref 3.5–5.1)
Total Bilirubin: 0.6 mg/dL (ref 0.3–1.2)

## 2013-02-04 LAB — CBC
HCT: 34.3 % — ABNORMAL LOW (ref 36.0–46.0)
Hemoglobin: 11.3 g/dL — ABNORMAL LOW (ref 12.0–15.0)
MCH: 27.6 pg (ref 26.0–34.0)
MCHC: 32.9 g/dL (ref 30.0–36.0)
MCV: 83.9 fL (ref 78.0–100.0)

## 2013-02-04 LAB — MAGNESIUM: Magnesium: 1.9 mg/dL (ref 1.5–2.5)

## 2013-02-04 MED ORDER — HYDROCHLOROTHIAZIDE 25 MG PO TABS
25.0000 mg | ORAL_TABLET | Freq: Every day | ORAL | Status: DC
  Administered 2013-02-04 – 2013-02-05 (×2): 25 mg via ORAL
  Filled 2013-02-04 (×2): qty 1

## 2013-02-04 MED ORDER — SALINE SPRAY 0.65 % NA SOLN
1.0000 | NASAL | Status: DC | PRN
  Filled 2013-02-04: qty 44

## 2013-02-04 MED ORDER — FAMOTIDINE 20 MG PO TABS
20.0000 mg | ORAL_TABLET | Freq: Every day | ORAL | Status: DC
  Administered 2013-02-04 – 2013-02-05 (×2): 20 mg via ORAL
  Filled 2013-02-04 (×2): qty 1

## 2013-02-04 MED ORDER — SENNOSIDES-DOCUSATE SODIUM 8.6-50 MG PO TABS: 1.0000 | ORAL_TABLET | Freq: Every day | ORAL | Status: DC | PRN

## 2013-02-04 MED ORDER — POTASSIUM CHLORIDE 20 MEQ PO PACK
40.0000 meq | PACK | Freq: Once | ORAL | Status: DC
  Filled 2013-02-04: qty 2

## 2013-02-04 MED ORDER — SENNA-DOCUSATE SODIUM 8.6-50 MG PO TABS: 1.0000 | ORAL_TABLET | Freq: Every day | ORAL | Status: DC

## 2013-02-04 MED ORDER — LORATADINE 10 MG PO TABS
10.0000 mg | ORAL_TABLET | Freq: Every day | ORAL | Status: DC
  Administered 2013-02-04 – 2013-02-05 (×2): 10 mg via ORAL
  Filled 2013-02-04 (×2): qty 1

## 2013-02-04 MED ORDER — POTASSIUM CHLORIDE CRYS ER 20 MEQ PO TBCR
40.0000 meq | EXTENDED_RELEASE_TABLET | Freq: Once | ORAL | Status: AC
  Administered 2013-02-04: 40 meq via ORAL
  Filled 2013-02-04: qty 2

## 2013-02-04 MED ORDER — BENAZEPRIL HCL 20 MG PO TABS
20.0000 mg | ORAL_TABLET | Freq: Every day | ORAL | Status: DC
  Administered 2013-02-04 – 2013-02-05 (×2): 20 mg via ORAL
  Filled 2013-02-04 (×2): qty 1

## 2013-02-04 MED ORDER — BENAZEPRIL-HYDROCHLOROTHIAZIDE 20-25 MG PO TABS
1.0000 | ORAL_TABLET | Freq: Every day | ORAL | Status: DC
Start: 2013-02-04 — End: 2013-02-04

## 2013-02-04 NOTE — Progress Notes (Signed)
1 Day Post-Op  Subjective: Had significant head pressure last night that has started to subside.  She has required morphine.  She feels nauseous and has taken Zofran.  She notes some bloody drainage into her throat.  Objective: Vital signs in last 24 hours: Temp:  [98.1 F (36.7 C)-99.1 F (37.3 C)] 98.1 F (36.7 C) (08/02 1000) Pulse Rate:  [67-85] 71 (08/02 1000) Resp:  [12-19] 18 (08/02 1000) BP: (126-166)/(53-104) 126/53 mmHg (08/02 1000) SpO2:  [94 %-100 %] 99 % (08/02 1000) Weight:  [126 kg (277 lb 12.5 oz)] 126 kg (277 lb 12.5 oz) (08/01 1710) Last BM Date: 02/03/13  Intake/Output from previous day: 08/01 0701 - 08/02 0700 In: 2240 [P.O.:240; I.V.:2000] Out: 850 [Urine:350; Blood:500] Intake/Output this shift:    General appearance: alert, cooperative and no distress Nose: Right nasal passage packed.  No active bleeding.  Subconjunctival hemorrhage laterally bilaterally.  Lab Results:   Recent Labs  02/02/13 0847 02/04/13 0500  WBC 6.8 8.5  HGB 13.3 11.3*  HCT 39.7 34.3*  PLT 335 299   BMET  Recent Labs  02/02/13 0847 02/04/13 0500  NA 137 135  K 3.5 3.2*  CL 99 101  CO2 29 23  GLUCOSE 106* 117*  BUN 16 7  CREATININE 0.69 0.66  CALCIUM 9.5 8.3*   PT/INR No results found for this basename: LABPROT, INR,  in the last 72 hours ABG No results found for this basename: PHART, PCO2, PO2, HCO3,  in the last 72 hours  Studies/Results: No results found.  Anti-infectives: Anti-infectives   Start     Dose/Rate Route Frequency Ordered Stop   02/03/13 1500  clindamycin (CLEOCIN) IVPB 600 mg     600 mg 100 mL/hr over 30 Minutes Intravenous 3 times per day 02/03/13 0656     02/03/13 0730  clindamycin (CLEOCIN) 600 MG/50ML IVPB    Comments:  MIRARCHI, ANGIE: cabinet override      02/03/13 0730 02/03/13 0750   02/03/13 0700  clindamycin (CLEOCIN) IVPB 600 mg  Status:  Discontinued     600 mg 100 mL/hr over 30 Minutes Intravenous  Once 02/03/13 0649  02/03/13 1403      Assessment/Plan: s/p Procedure(s) with comments: ENDOSCOPIC SINUS SURGERY WITH FUSION NAVIGATION (Bilateral) - Repaired CSF Leak Several lab results lower today compared to preop, including potassium.  Recheck labs tomorrow morning.  Symptoms necessitate another night in the hospital with IV management.  Possible discharge tomorrow.  Continue current treatments.  LOS: 1 day    [name] 02/04/2013

## 2013-02-05 LAB — CBC
HCT: 35.1 % — ABNORMAL LOW (ref 36.0–46.0)
Hemoglobin: 11.6 g/dL — ABNORMAL LOW (ref 12.0–15.0)
WBC: 7.5 10*3/uL (ref 4.0–10.5)

## 2013-02-05 LAB — COMPREHENSIVE METABOLIC PANEL
BUN: 7 mg/dL (ref 6–23)
Calcium: 8.7 mg/dL (ref 8.4–10.5)
Creatinine, Ser: 0.66 mg/dL (ref 0.50–1.10)
GFR calc Af Amer: >90 mL/min (ref >90)
Glucose, Bld: 108 mg/dL — ABNORMAL HIGH (ref 70–99)
Total Protein: 6.4 g/dL (ref 6.0–8.3)

## 2013-02-05 NOTE — Discharge Summary (Signed)
Physician Discharge Summary  Patient ID: [name] MRN: [id number] DOB/AGE: 49-Sep-1967 49 y.o.  Admit date: 02/03/2013 Discharge date: 02/05/2013  Admission Diagnoses: CSF rhinorrhea  Discharge Diagnoses: same   Discharged Condition: good  Hospital Course: 47 year old female with CSF rhinorrhea underwent endoscopic repair by Dr. Emeline Darling on 8/1.  For details, see operative note.  She was observed in a regular hospital room following surgery and progressed over the following two days with improving head pressure.  She was started on Diamox during this admission.  By POD 2, she was feeling better and felt comfortable going home.  Consults: None  Significant Diagnostic Studies: labs  Treatments: surgery: Endoscopic repair of CSF rhinorrhea  Discharge Exam: Blood pressure 155/77, pulse 76, temperature 98.1 F (36.7 C), temperature source Oral, resp. rate 18, height 5\' 5"  (1.651 m), weight 126 kg (277 lb 12.5 oz), last menstrual period 01/23/2013, SpO2 98.00%. General appearance: alert, cooperative and no distress Nose: right nasal pack in place, no active bleeding.  Disposition: 01-Home or Self Care  Discharge Orders   Future Appointments Provider Department Dept Phone   02/09/2013 1:30 PM Nilda Riggs, NP [facility] [phone]   Future Orders Complete By Expires     Diet - low sodium heart healthy  As directed     Discharge instructions  As directed     Comments:      Keep head elevated.  Limited activity.  Prescriptions have been handled by Dr. Emeline Darling.  Use saline spray in each nostril at least every 2-4 hours.    Increase activity slowly  As directed         Medication List         acetaminophen 500 MG tablet  Commonly known as:  TYLENOL  Take 1,000 mg by mouth 2 (two) times daily as needed for pain.     benazepril-hydrochlorthiazide 20-25 MG per tablet  Commonly known as:  LOTENSIN HCT  Take 1 tablet by mouth daily.     cetirizine 10 MG  tablet  Commonly known as:  ZYRTEC  Take 10 mg by mouth daily.     famotidine 20 MG tablet  Commonly known as:  PEPCID  Take 20 mg by mouth daily.     sennosides-docusate sodium 8.6-50 MG tablet  Commonly known as:  SENOKOT-S  Take 1 tablet by mouth daily.           Follow-up Information   Follow up with Melvenia Beam, MD. Schedule an appointment as soon as possible for a visit in 2 weeks.   Contact information:   [address] [place] [address] [phone]       Signed: Christia Reading 02/05/2013, 11:57 AM

## 2013-02-05 NOTE — Progress Notes (Signed)
Pt for discharge home today.   IV D/C with nasal packing to R nare.D/C instructions and Rx given with verbalized understanding.  Family at bedside to assist pt with discharge. Staff brought pt downstairs via wheelchair.

## 2013-02-07 ENCOUNTER — Encounter (HOSPITAL_COMMUNITY): Payer: Self-pay | Admitting: Otolaryngology

## 2013-02-09 ENCOUNTER — Ambulatory Visit: Payer: 59 | Admitting: Nurse Practitioner

## 2013-03-01 ENCOUNTER — Ambulatory Visit (INDEPENDENT_AMBULATORY_CARE_PROVIDER_SITE_OTHER): Payer: 59 | Admitting: Nurse Practitioner

## 2013-03-01 ENCOUNTER — Encounter: Payer: Self-pay | Admitting: Nurse Practitioner

## 2013-03-01 VITALS — BP 114/68 | HR 77 | Ht 64.5 in | Wt 262.0 lb

## 2013-03-01 DIAGNOSIS — H9311 Tinnitus, right ear: Secondary | ICD-10-CM

## 2013-03-01 DIAGNOSIS — H9319 Tinnitus, unspecified ear: Secondary | ICD-10-CM

## 2013-03-01 DIAGNOSIS — R51 Headache: Secondary | ICD-10-CM

## 2013-03-01 DIAGNOSIS — E669 Obesity, unspecified: Secondary | ICD-10-CM

## 2013-03-01 NOTE — Progress Notes (Signed)
I have read the note, and I agree with the clinical assessment and plan.  Tanicka Bisaillon KEITH   

## 2013-03-01 NOTE — Patient Instructions (Addendum)
Continue Diamox at current dose We can order this for you when you stop seeing Dr. Emeline Darling Followup in 6 months

## 2013-03-01 NOTE — Progress Notes (Signed)
Reason for visit followup for pulsatile tinnitus  HPI:  [name], 47 year old black female returns for followup. She was initially evaluated by Dr. Anne Hahn 11/03/2012. 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 At her last visit she was started on Topamax for the headache and pulsatile tinnitus. She called in to the office on 01/02/13  saying that she had had clear nasal drainage for over a month. Fluid analysis revealed glucose of 53 suggestive of CS F. She had surgery on 02/03/2013 by Dr. Emeline Darling. Endoscopic  sinus surgery with fusion and repair of cerebral spinal fluid leak. She was placed on Diamox 500 twice daily. She is having occasional headaches now. She denies side effects of the Diamox . She is trying to lose weight   HISTORY: The patient indicates that she will also occasionally have some headaches over the right frontotemporal area that are mild, occurring once or twice a week. The patient will occasionally have some nausea with the headache and some blurring of vision in the right eye. The patient does have some neck stiffness at times. The patient indicates that when she compresses the neck on the right side, the pulsatile tinnitus goes away. Otherwise, the pulsations are present at all times. The patient has been followed through Dr. Salvatore Marvel from optometry, and no evidence of papilledema has been noted in the past. The patient last had an examination several months ago. The patient has been seen by Dr. Dorma Russell, and a thorough evaluation was done including MRI of the brain, MRA of the head, and MRV of the head. These studies were unremarkable. The patient is sent to this office for an evaluation. The patient has not noted any other issues such as weakness of the extremities, but she does occasionally have some tingling into the right arm. The patient denies problems controlling    ROS:  Negative except for pulsatile sound in the right ear and occasional  headache   Medications Current Outpatient Prescriptions on File Prior to Visit  Medication Sig Dispense Refill  . acetaminophen (TYLENOL) 500 MG tablet Take 1,000 mg by mouth 2 (two) times daily as needed for pain.      . benazepril-hydrochlorthiazide (LOTENSIN HCT) 20-25 MG per tablet Take 1 tablet by mouth daily.      . cetirizine (ZYRTEC) 10 MG tablet Take 10 mg by mouth daily.       . famotidine (PEPCID) 20 MG tablet Take 20 mg by mouth daily.      . sennosides-docusate sodium (SENOKOT-S) 8.6-50 MG tablet Take 1 tablet by mouth daily.       No current facility-administered medications on file prior to visit.    Allergies  Allergies  Allergen Reactions  . Codeine Nausea And Vomiting  . Hydrocodone Itching    Physical Exam General: well developed, morbidly obese female seated, in no evident distress Head: head normocephalic and atraumatic. Oropharynx benign Neck: supple with no carotid  bruits Cardiovascular: regular rate and rhythm, no murmurs  Neurologic Exam Mental Status: Awake and fully alert. Oriented to place and time. Follows all commands. Speech and language normal.   Cranial Nerves: Fundoscopic exam reveals flat  discs. Pupils equal, briskly reactive to light. Extraocular movements full without nystagmus. Visual fields full to confrontation. Hearing intact and symmetric to finger snap. Facial sensation intact. Face, tongue, palate move normally and symmetrically. Neck flexion and extension normal.  Motor: Normal bulk and tone. Normal strength in all tested extremity muscles.No  focal weakness Coordination: Rapid alternating movements normal in all extremities. Finger-to-nose and heel-to-shin performed accurately bilaterally. No dysmetria Gait and Station: Arises from chair without difficulty. Stance is normal. Gait demonstrates normal stride length and balance . Able to heel, toe and tandem walk without difficulty.  Reflexes: 2+ and symmetric. Toes downgoing.      ASSESSMENT: pulsatile tinnitus, morbid obesity, CSF leak with endoscopic sinus surgery with fusion and repair     PLAN: Continue Diamox at current dose We can order this for you when you stop seeing Dr. Emeline Darling Followup in 6 months Reviewed recent CMP  Nilda Riggs, GNP-BC APRN

## 2013-05-11 ENCOUNTER — Other Ambulatory Visit: Payer: Self-pay

## 2013-09-01 ENCOUNTER — Ambulatory Visit (INDEPENDENT_AMBULATORY_CARE_PROVIDER_SITE_OTHER): Payer: 59 | Admitting: Nurse Practitioner

## 2013-09-01 ENCOUNTER — Encounter: Payer: Self-pay | Admitting: Nurse Practitioner

## 2013-09-01 ENCOUNTER — Ambulatory Visit: Payer: 59 | Admitting: Nurse Practitioner

## 2013-09-01 VITALS — BP 121/73 | HR 84 | Ht 64.0 in | Wt 259.0 lb

## 2013-09-01 DIAGNOSIS — H93A9 Pulsatile tinnitus, unspecified ear: Secondary | ICD-10-CM

## 2013-09-01 DIAGNOSIS — R51 Headache: Secondary | ICD-10-CM

## 2013-09-01 DIAGNOSIS — E669 Obesity, unspecified: Secondary | ICD-10-CM

## 2013-09-01 DIAGNOSIS — H9319 Tinnitus, unspecified ear: Secondary | ICD-10-CM

## 2013-09-01 NOTE — Progress Notes (Signed)
[facility]  PATIENT: [name] DOB: 13-Feb-1966   REASON FOR VISIT: Followup for headaches and pulsatile tinnitus   HISTORY OF PRESENT ILLNESS:[name], 48 year old female returns for followup. 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 She had surgery 02/03/2013 by Dr. Simeon Craft for  endoscopic sinus surgery with fusion repair of CSF leak. She has been on Diamox 500 since that time twice daily. She has occasional headaches. She is obese. 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 She complains of sometimes having difficulty focusing. Returns for repeat evaluation.   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 At her last visit she was started on Topamax for the headache and pulsatile tinnitus. She called in to the office on 01/02/13 saying that she had had clear nasal drainage for over a month. Fluid analysis revealed glucose of 53 suggestive of CS F. She had surgery on 02/03/2013 by Dr. Simeon Craft. Endoscopic sinus surgery with fusion and repair of cerebral spinal fluid leak. She was placed on Diamox 500 twice daily. She is having occasional headaches now. She denies side effects of the Diamox . She is trying to lose weight  HISTORY: The patient indicates that she will also occasionally have some headaches over the right frontotemporal area that are mild, occurring once or twice a week. The patient will occasionally have some nausea with the headache and some blurring of vision in the right eye. The patient does have some neck stiffness at times. The patient indicates that when she compresses the neck on the right side, the pulsatile tinnitus goes away. Otherwise, the pulsations are present at all times. The patient has been followed through Dr. Donato Heinz from optometry, and no evidence of papilledema has been noted in the past. The patient last had an examination several months ago. The patient has been seen by  Dr. Thornell Mule, and a thorough evaluation was done including MRI of the brain, MRA of the head, and MRV of the head. These studies were unremarkable. The patient is sent to this office for an evaluation. The patient has not noted any other issues such as weakness of the extremities, but she does occasionally have some tingling into the right arm.  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 At her last visit she was started on Topamax for the headache and pulsatile tinnitus. She called in to the office on 01/02/13 saying that she had had clear nasal drainage for over a month. Fluid analysis revealed glucose of 53 suggestive of CS F. She had surgery on 02/03/2013 by Dr. Simeon Craft. Endoscopic sinus surgery with fusion and repair of cerebral spinal fluid leak. She was placed on Diamox 500 twice daily. She is having occasional headaches now. She denies side effects of the Diamox . She is trying to lose weight      REVIEW OF SYSTEMS: Full 14 system review of systems performed and notable only for those listed, all others are neg:  Constitutional: N/A  Cardiovascular: N/A  Ear/Nose/Throat: N/A  Skin: N/A  Eyes: N/A  Respiratory: N/A  Gastroitestinal: N/A  Hematology/Lymphatic: N/A  Endocrine: N/A Musculoskeletal: Occasional muscle cramps Allergy/Immunology: N/A  Neurological: Intermittent headaches, occasional dizziness Psychiatric: Unable to focus at times  ALLERGIES: Allergies  Allergen Reactions  . Codeine Nausea And Vomiting  . Hydrocodone Itching    HOME MEDICATIONS: Outpatient Prescriptions Prior to Visit  Medication Sig Dispense Refill  .  acetaminophen (TYLENOL) 500 MG tablet Take 500 mg by mouth 2 (two) times daily as needed for pain. Tylenol with aspirin and caffeine.      Marland Kitchen acetaZOLAMIDE (DIAMOX) 500 MG capsule Take 500 mg by mouth 2 (two) times daily.      . benazepril-hydrochlorthiazide (LOTENSIN HCT) 20-25 MG per tablet Take 1 tablet by mouth daily.      . cetirizine (ZYRTEC) 10  MG tablet Take 10 mg by mouth daily.       . famotidine (PEPCID) 20 MG tablet Take 20 mg by mouth daily.      . potassium chloride SA (K-DUR,KLOR-CON) 20 MEQ tablet Take 20 mEq by mouth 2 (two) times daily.      . sennosides-docusate sodium (SENOKOT-S) 8.6-50 MG tablet Take 1 tablet by mouth daily.       No facility-administered medications prior to visit.    PAST MEDICAL HISTORY: Past Medical History  Diagnosis Date  . Allergy     takes Zyrtec daily  . Unspecified vitamin D deficiency   . Lumbago   . Obesity, unspecified   . Pulsatile tinnitus   . Gastroesophageal reflux disease     takes Pepcid daily  . Hypertension     takes Lotensin daily  . Family history of anesthesia complication     mother got confused some after anesthesia  . Peripheral edema   . History of bronchitis     pt states a very long time ago  . Headache(784.0)     benign intercranial HTN d/t CSF leak  . Back pain     arthritis  . Eczema   . Chronic constipation     takes Stool Softener  . History of MRSA infection     > 85yrs ago    PAST SURGICAL HISTORY: Past Surgical History  Procedure Laterality Date  . Gallbladder surgery  101yrs ago  . Cesarean section  48yrs ago  . Sinus endo w/fusion Bilateral 02/03/2013    Procedure: ENDOSCOPIC SINUS SURGERY WITH FUSION NAVIGATION;  Surgeon: Ruby Cola, MD;  Location: [place];  Service: ENT;  Laterality: Bilateral;  Repaired CSF Leak    FAMILY HISTORY: Family History  Problem Relation Age of Onset  . Cancer - Prostate Father   . Hypertension Father   . Hypertension Mother   . Diabetes Mother   . Heart attack Neg Hx   . Hyperlipidemia Neg Hx   . Sudden death Neg Hx     SOCIAL HISTORY: History   Social History  . Marital Status: Married    Spouse Name: [name]    Number of Children: 12  . Years of Education: 12+   Occupational History  .  [employer]   Social History Main Topics  . Smoking status: Never Smoker   . Smokeless tobacco: Never  Used  . Alcohol Use: No  . Drug Use: No  . Sexual Activity: Yes   Other Topics Concern  . Not on file   Social History Narrative   Patient lives at home with husband and son. [name])   Patient has 2 children.    Patient is currently working as an Therapist, sports.   Patient has a college education.    Patient is right handed.      PHYSICAL EXAM  Filed Vitals:   09/01/13 1333 09/01/13 1335  BP: 96/62 121/73  Pulse: 72 84  Height: 5\' 4"  (1.626 m)   Weight: 259 lb (117.482 kg)    Body mass index is 44.44  kg/(m^2).  Generalized: Well developed, morbidly obese female in no acute distress  Head: normocephalic and atraumatic,. mallompatti 4 Neck: Supple, no carotid bruits , neck 16.5 inches Cardiac: Regular rate rhythm, no murmur  Musculoskeletal: No deformity   Neurological examination   Mentation: Alert oriented to time, place, history taking. Follows all commands speech and language fluent. ESS 3   Cranial nerve II-XII: Fundoscopic exam reveals flat disc margins.Pupils were equal round reactive to light extraocular movements were full, visual field were full on confrontational test. Facial sensation and strength were normal. hearing was intact to finger rubbing bilaterally. Uvula tongue midline. head turning and shoulder shrug were normal and symmetric.Tongue protrusion into cheek strength was normal. Motor: normal bulk and tone, full strength in the BUE, BLE, No focal weakness Coordination: finger-nose-finger, heel-to-shin bilaterally, no dysmetria Reflexes: Brachioradialis 2/2, biceps 2/2, triceps 2/2, patellar 2/2, Achilles 2/2, plantar responses were flexor bilaterally. Gait and Station: Rising up from seated position without assistance, normal stance,  moderate stride, good arm swing, smooth turning, able to perform tiptoe, and heel walking without difficulty. Tandem gait is steady  DIAGNOSTIC DATA (LABS, IMAGING, TESTING) - I reviewed patient records, labs, notes, testing and imaging  myself where available.  Lab Results  Component Value Date   WBC 7.5 02/05/2013   HGB 11.6* 02/05/2013   HCT 35.1* 02/05/2013   MCV 85.4 02/05/2013   PLT 291 02/05/2013      Component Value Date/Time   NA 133* 02/05/2013 0502   NA 140 12/10/2011   K 3.6 02/05/2013 0502   CL 103 02/05/2013 0502   CO2 20 02/05/2013 0502   GLUCOSE 108* 02/05/2013 0502   BUN 7 02/05/2013 0502   BUN 11 12/10/2011   CREATININE 0.66 02/05/2013 0502   CREATININE 0.7 12/10/2011   CALCIUM 8.7 02/05/2013 0502   PROT 6.4 02/05/2013 0502   ALBUMIN 3.1* 02/05/2013 0502   AST 20 02/05/2013 0502   ALT 12 02/05/2013 0502   ALKPHOS 77 02/05/2013 0502   BILITOT 0.4 02/05/2013 0502   GFRNONAA >90 02/05/2013 0502   GFRAA >90 02/05/2013 0502       ASSESSMENT AND PLAN  48 y.o. year old female  has a past medical history of Allergy; Unspecified vitamin D deficiency; Lumbago; Obesity, unspecified; Pulsatile tinnitus; Gastroesophageal reflux disease; Hypertension; Headache(784.0); here to followup. 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 She is morbidly obese  Continue Diamox at current dose.  Will check sleep study F/U in 6 months Dennie Bible, [facility], [facility], APRN  [facility] [address], [place], [place] [address] [phone]

## 2013-09-01 NOTE — Patient Instructions (Signed)
Continue Diamox at current dose.  Will check sleep study F/U in 6 months

## 2013-09-01 NOTE — Progress Notes (Signed)
I have read the note, and I agree with the clinical assessment and plan.  Rocio Wolak KEITH   

## 2013-09-05 ENCOUNTER — Telehealth: Payer: Self-pay | Admitting: Neurology

## 2013-09-05 DIAGNOSIS — R519 Headache, unspecified: Secondary | ICD-10-CM

## 2013-09-05 DIAGNOSIS — G4726 Circadian rhythm sleep disorder, shift work type: Secondary | ICD-10-CM

## 2013-09-05 DIAGNOSIS — R51 Headache: Secondary | ICD-10-CM

## 2013-09-05 DIAGNOSIS — I10 Essential (primary) hypertension: Secondary | ICD-10-CM

## 2013-09-05 NOTE — Telephone Encounter (Signed)
. °  Cecille Rubin, NP is referring [name], 48 y.o. female, for an attended sleep study.  Wt: 259 lbs. Ht: 64 in. BMI: 44.44  Diagnoses: Shift Worker Morbid Obesity Headache Hypertension Insomnia  Medication List: Current Outpatient Prescriptions  Medication Sig Dispense Refill   acetaminophen (TYLENOL) 500 MG tablet Take 500 mg by mouth 2 (two) times daily as needed for pain. Tylenol with aspirin and caffeine.       acetaZOLAMIDE (DIAMOX) 500 MG capsule Take 500 mg by mouth 2 (two) times daily.       benazepril-hydrochlorthiazide (LOTENSIN HCT) 20-25 MG per tablet Take 1 tablet by mouth daily.       cetirizine (ZYRTEC) 10 MG tablet Take 10 mg by mouth daily.        famotidine (PEPCID) 20 MG tablet Take 20 mg by mouth daily.       ondansetron (ZOFRAN) 4 MG tablet Take 4 mg by mouth every 8 (eight) hours as needed for nausea or vomiting.       potassium chloride SA (K-DUR,KLOR-CON) 20 MEQ tablet Take 20 mEq by mouth 2 (two) times daily.       sennosides-docusate sodium (SENOKOT-S) 8.6-50 MG tablet Take 1 tablet by mouth daily.       No current facility-administered medications for this visit.   This patient presents to Cecille Rubin, NP in follow up for headache.  Pt is morbidly obese with hypertension.  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  She does not admit to snoring or eds.  She endorses Epworth at 3.    Insurance:  [employer] - [employer] - Prior authorization is not required

## 2013-09-05 NOTE — Telephone Encounter (Signed)
This patient is referred for sleep study by Cecille Rubin, nurse practitioner, for complaint of recurrent headaches, in the context of shift work, hypertension and obesity. She works from 7 PM to 7 AM. She may be able to schedule and nighttime sleep study on 1 of her nights off. Otherwise we may be able to schedule for daytime study as an exception. After reviewing the sleep study referral, I entered a split night sleep study request on this patient, thanks.  Star Age, MD, PhD [facility])

## 2013-09-11 ENCOUNTER — Telehealth: Payer: Self-pay | Admitting: Nurse Practitioner

## 2013-09-11 NOTE — Telephone Encounter (Signed)
Called pt to schedule sleep study appointment and go over benefits.  Pt says that she would prefer to hold off on having a sleep study until she is able to request advisement from her pcp regarding medical necessity of the test.  Advised the patient that we hold her referral until further notice.

## 2013-09-18 ENCOUNTER — Ambulatory Visit: Payer: 59 | Admitting: Podiatry

## 2014-03-01 ENCOUNTER — Encounter: Payer: Self-pay | Admitting: Nurse Practitioner

## 2014-03-01 ENCOUNTER — Ambulatory Visit (INDEPENDENT_AMBULATORY_CARE_PROVIDER_SITE_OTHER): Payer: 59 | Admitting: Nurse Practitioner

## 2014-03-01 VITALS — BP 116/71 | HR 77 | Ht 64.0 in | Wt 260.0 lb

## 2014-03-01 DIAGNOSIS — H9319 Tinnitus, unspecified ear: Secondary | ICD-10-CM

## 2014-03-01 DIAGNOSIS — H9311 Tinnitus, right ear: Secondary | ICD-10-CM

## 2014-03-01 DIAGNOSIS — R51 Headache: Secondary | ICD-10-CM

## 2014-03-01 MED ORDER — ACETAZOLAMIDE ER 500 MG PO CP12
500.0000 mg | ORAL_CAPSULE | Freq: Two times a day (BID) | ORAL | Status: DC
Start: 2014-03-01 — End: 2014-10-23

## 2014-03-01 NOTE — Progress Notes (Signed)
I have read the note, and I agree with the clinical assessment and plan.  Eloyce Bultman KEITH   

## 2014-03-01 NOTE — Progress Notes (Signed)
[facility]  PATIENT: [name] DOB: 1966/02/21   REASON FOR VISIT: follow up headaches and pulsatile tinnitus   HISTORY OF PRESENT ILLNESS:Ms. Lelon Frohlich, 48 year old female returns for followup. She has a history of pulsatile tinnitus in the right ear for 3 years. She had surgery 02/03/2013 by Dr. Simeon Craft for endoscopic sinus surgery with fusion repair of CSF leak. She has been on Diamox 500 since that time twice daily. She has occasional headaches. She is obese. 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 She complains of sometimes having difficulty focusing. She was asked to get a sleep study after her last visit but she did not followup for this. After further discussion today she is ready to have a sleep study done. She needs refills on her medication Returns for repeat evaluation.  HISTORY: of pulsatile tinnitus in the right ear for 2 years . At her last visit she was started on Topamax for the headache and pulsatile tinnitus. She called in to the office on 01/02/13 saying that she had had clear nasal drainage for over a month. Fluid analysis revealed glucose of 53 suggestive of CS F. She had surgery on 02/03/2013 by Dr. Simeon Craft. Endoscopic sinus surgery with fusion and repair of cerebral spinal fluid leak. She was placed on Diamox 500 twice daily. She is having occasional headaches now. She denies side effects of the Diamox . She is trying to lose weight  HISTORY: The patient indicates that she will also occasionally have some headaches over the right frontotemporal area that are mild, occurring once or twice a week. The patient will occasionally have some nausea with the headache and some blurring of vision in the right eye. The patient does have some neck stiffness at times. The patient indicates that when she compresses the neck on the right side, the pulsatile tinnitus goes away. Otherwise, the pulsations are present at all times. The patient has been  followed through Dr. Donato Heinz from optometry, and no evidence of papilledema has been noted in the past. The patient last had an examination several months ago. The patient has been seen by Dr. Thornell Mule, and a thorough evaluation was done including MRI of the brain, MRA of the head, and MRV of the head. These studies were unremarkable. The patient is sent to this office for an evaluation. The patient has not noted any other issues such as weakness of the extremities, but she does occasionally have some tingling into the right arm.  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 At her last visit she was started on Topamax for the headache and pulsatile tinnitus. She called in to the office on 01/02/13 saying that she had had clear nasal drainage for over a month. Fluid analysis revealed glucose of 53 suggestive of CS F. She had surgery on 02/03/2013 by Dr. Simeon Craft. Endoscopic sinus surgery with fusion and repair of cerebral spinal fluid leak. She was placed on Diamox 500 twice daily. She is having occasional headaches now. She denies side effects of the Diamox . She is trying to lose weight      REVIEW OF SYSTEMS: Full 14 system review of systems performed and notable only for those listed, all others are neg:  Constitutional: N/A  Cardiovascular: N/A  Ear/Nose/Throat: N/A  Skin: N/A  Eyes: N/A  Respiratory: N/A  Gastroitestinal: N/A  Hematology/Lymphatic: N/A  Endocrine: N/A Musculoskeletal:N/A  Allergy/Immunology: N/A  Neurological: N/A Psychiatric: N/A Sleep : NA   ALLERGIES: Allergies  Allergen Reactions  . Codeine Nausea And Vomiting  . Hydrocodone Itching    HOME MEDICATIONS: Outpatient Prescriptions Prior to Visit  Medication Sig Dispense Refill  . acetaminophen (TYLENOL) 500 MG tablet Take 500 mg by mouth 2 (two) times daily as needed for pain. Tylenol with aspirin and caffeine.      Marland Kitchen acetaZOLAMIDE (DIAMOX) 500 MG capsule Take 500 mg by mouth 2 (two) times daily.      .  benazepril-hydrochlorthiazide (LOTENSIN HCT) 20-25 MG per tablet Take 1 tablet by mouth daily.      . cetirizine (ZYRTEC) 10 MG tablet Take 10 mg by mouth daily.       . famotidine (PEPCID) 20 MG tablet Take 20 mg by mouth daily.      . ondansetron (ZOFRAN) 4 MG tablet Take 4 mg by mouth every 8 (eight) hours as needed for nausea or vomiting.      . potassium chloride SA (K-DUR,KLOR-CON) 20 MEQ tablet Take 20 mEq by mouth 2 (two) times daily.      . sennosides-docusate sodium (SENOKOT-S) 8.6-50 MG tablet Take 1 tablet by mouth daily.       No facility-administered medications prior to visit.    PAST MEDICAL HISTORY: Past Medical History  Diagnosis Date  . Allergy     takes Zyrtec daily  . Unspecified vitamin D deficiency   . Lumbago   . Obesity, unspecified   . Pulsatile tinnitus   . Gastroesophageal reflux disease     takes Pepcid daily  . Hypertension     takes Lotensin daily  . Family history of anesthesia complication     mother got confused some after anesthesia  . Peripheral edema   . History of bronchitis     pt states a very long time ago  . Headache(784.0)     benign intercranial HTN d/t CSF leak  . Back pain     arthritis  . Eczema   . Chronic constipation     takes Stool Softener  . History of MRSA infection     > 72yrs ago    PAST SURGICAL HISTORY: Past Surgical History  Procedure Laterality Date  . Gallbladder surgery  67yrs ago  . Cesarean section  83yrs ago  . Sinus endo w/fusion Bilateral 02/03/2013    Procedure: ENDOSCOPIC SINUS SURGERY WITH FUSION NAVIGATION;  Surgeon: Ruby Cola, MD;  Location: [place];  Service: ENT;  Laterality: Bilateral;  Repaired CSF Leak    FAMILY HISTORY: Family History  Problem Relation Age of Onset  . Cancer - Prostate Father   . Hypertension Father   . Hypertension Mother   . Diabetes Mother   . Heart attack Neg Hx   . Hyperlipidemia Neg Hx   . Sudden death Neg Hx     SOCIAL HISTORY: History   Social History    . Marital Status: Married    Spouse Name: Nicole Kindred    Number of Children: 12  . Years of Education: 12+   Occupational History  .  [employer]   Social History Main Topics  . Smoking status: Never Smoker   . Smokeless tobacco: Never Used  . Alcohol Use: No  . Drug Use: No  . Sexual Activity: Yes   Other Topics Concern  . Not on file   Social History Narrative   Patient lives at home with husband and son. Nicole Kindred)   Patient has 2 children.    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    Patient is right handed.      PHYSICAL EXAM  Filed Vitals:   03/01/14 1400  BP: 116/71  Pulse: 77  Height: 5\' 4"  (1.626 m)  Weight: 260 lb (117.935 kg)   Body mass index is 44.61 kg/(m^2). Generalized: Well developed, morbidly obese female in no acute distress  Head: normocephalic and atraumatic,. mallompatti 3- 4  Neck: Supple, no carotid bruits , neck 16.5 inches  Cardiac: Regular rate rhythm, no murmur  Musculoskeletal: No deformity  Neurological examination  Mentation: Alert oriented to time, place, history taking. Follows all commands speech and language fluent. ESS 4 Cranial nerve II-XII: Visual acuity 20/40 right 20/50 left. Fundoscopic exam reveals flat disc margins.Pupils were equal round reactive to light extraocular movements were full, visual field were full on confrontational test. Facial sensation and strength were normal. hearing was intact to finger rubbing bilaterally. Uvula tongue midline. head turning and shoulder shrug were normal and symmetric.Tongue protrusion into cheek strength was normal.  Motor: normal bulk and tone, full strength in the BUE, BLE, No focal weakness  Coordination: finger-nose-finger, heel-to-shin bilaterally, no dysmetria  Reflexes: Brachioradialis 2/2, biceps 2/2, triceps 2/2, patellar 2/2, Achilles 2/2, plantar responses were flexor bilaterally.  Gait and Station: Rising up from seated position without assistance,  normal stance, moderate stride, good arm swing, smooth turning, able to perform tiptoe, and heel walking without difficulty. Tandem gait is steady    DIAGNOSTIC DATA (LABS, IMAGING, TESTING) - I reviewed patient records, labs, notes, testing and imaging myself where available.  Lab Results  Component Value Date   WBC 7.5 02/05/2013   HGB 11.6* 02/05/2013   HCT 35.1* 02/05/2013   MCV 85.4 02/05/2013   PLT 291 02/05/2013      Component Value Date/Time   NA 133* 02/05/2013 0502   NA 140 12/10/2011   K 3.6 02/05/2013 0502   CL 103 02/05/2013 0502   CO2 20 02/05/2013 0502   GLUCOSE 108* 02/05/2013 0502   BUN 7 02/05/2013 0502   BUN 11 12/10/2011   CREATININE 0.66 02/05/2013 0502   CREATININE 0.7 12/10/2011   CALCIUM 8.7 02/05/2013 0502   PROT 6.4 02/05/2013 0502   ALBUMIN 3.1* 02/05/2013 0502   AST 20 02/05/2013 0502   ALT 12 02/05/2013 0502   ALKPHOS 77 02/05/2013 0502   BILITOT 0.4 02/05/2013 0502   GFRNONAA >90 02/05/2013 0502   GFRAA >90 02/05/2013 0502    ASSESSMENT AND PLAN  48 y.o. year old female  has a past medical history of Obesity, unspecified; Pulsatile tinnitus;  Hypertension;  Peripheral edema;  Headache(784.0); here to followup.  Continue Diamox at current dose will refill Will set up for sleep study Will followup with sleep physician after sleep study Followup with me in 6 months Dennie Bible, [facility], [facility], [facility] [address] [place], [place] [address] [phone]

## 2014-03-01 NOTE — Patient Instructions (Signed)
Continue Diamox at current dose will refill Will set up for sleep study Will followup with sleep physician after sleep study Followup with me in 6 months

## 2014-03-08 ENCOUNTER — Telehealth: Payer: Self-pay | Admitting: Neurology

## 2014-03-08 DIAGNOSIS — R519 Headache, unspecified: Secondary | ICD-10-CM

## 2014-03-08 DIAGNOSIS — H9319 Tinnitus, unspecified ear: Secondary | ICD-10-CM

## 2014-03-08 DIAGNOSIS — R51 Headache: Secondary | ICD-10-CM

## 2014-03-08 DIAGNOSIS — R0683 Snoring: Secondary | ICD-10-CM

## 2014-03-08 NOTE — Telephone Encounter (Signed)
Dr. Evlyn Courier ,refers patient for attended sleep study.  Height: 5'4"  Weight: 260lb  BMI: 44.61  Past Medical History:  Allergy  takes Zyrtec daily  .  Unspecified vitamin D deficiency  .  Lumbago  .  Obesity, unspecified  .  Pulsatile tinnitus  .  Gastroesophageal reflux disease  takes Pepcid daily  .  Hypertension  takes Lotensin daily  .  Family history of anesthesia complication  mother got confused some after anesthesia  .  Peripheral edema  .  History of bronchitis  pt states a very long time ago  .  Headache(784.0)  benign intercranial HTN d/t CSF leak  .  Back pain  arthritis  .  Eczema  .  Chronic constipation  takes Stool Softener  .  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 She complains of sometimes having difficulty focusing. She was asked to get a sleep study after her last visit but she did not followup for this. After further discussion today she is ready to have a sleep study done.    Epworth Score: Called patient to complete ESS, but unable to reach.   Medication: AcetaZOLAMIDE (Capsule SR 12 hr) DIAMOX 500 MG Take 1 capsule (500 mg total) by mouth 2 (two) times daily. Acetaminophen (Tab) TYLENOL 500 MG Take 500 mg by mouth 2 (two) times daily as needed for pain. Tylenol with aspirin and caffeine. Benazepril-Hydrochlorothiazide (Tab) LOTENSIN HCT 20-25 MG Take 1 tablet by mouth daily. Cetirizine HCl (Tab) ZYRTEC 10 MG Take 10 mg by mouth daily. Famotidine (Tab) PEPCID 20 MG Take 20 mg by mouth daily. Ondansetron HCl (Tab) ZOFRAN 4 MG Take 4 mg by mouth every 8 (eight) hours as needed for nausea or vomiting. Potassium Chloride Crys CR (Tab CR) K-DUR,KLOR-CON 20 MEQ Take 20 mEq by mouth 2 (two) times daily. Sennosides-Docusate Sodium (Tab) SENOKOT-S 8.6-50 MG Take 1 tablet by mouth daily    Ins: UMR   Assessment & Plan: 48 y.o. year old female has a past  medical history of Obesity, unspecified; Pulsatile tinnitus; Hypertension; Peripheral edema; Headache(784.0); here to followup.  Continue Diamox at current dose will refill  Will set up for sleep study  Will followup with sleep physician after sleep study  Followup with me in 6 months  Dennie Bible, [facility], [facility], APRN   Please review patient information and submit instructions for scheduling and orders for sleep technologist. Thank you.

## 2014-03-19 NOTE — Addendum Note (Signed)
Addended by: Star Age on: 03/19/2014 05:12 PM   Modules accepted: Orders

## 2014-03-19 NOTE — Telephone Encounter (Signed)
Placed sleep study order in 3/15. Do I need to do again?

## 2014-03-19 NOTE — Telephone Encounter (Signed)
Sleep study request review: This patient has an underlying medical history of severe obesity, pulsatile tinnitus, pseudotumor cerebri, vitamin D deficiency, hypertension, allergies, peripheral edema and chronic back pain and is referred by Cecille Rubin, nurse practitioner, for an attended sleep study due to a report of snoring, nonrestorative sleep, recurrent headaches. I will order a split-night sleep study and see the patient in sleep medicine consultation afterwards. Star Age, MD, PhD [facility])

## 2014-07-08 ENCOUNTER — Ambulatory Visit (INDEPENDENT_AMBULATORY_CARE_PROVIDER_SITE_OTHER): Payer: 59 | Admitting: Neurology

## 2014-07-08 VITALS — BP 130/89

## 2014-07-08 DIAGNOSIS — R9431 Abnormal electrocardiogram [ECG] [EKG]: Secondary | ICD-10-CM

## 2014-07-08 DIAGNOSIS — G479 Sleep disorder, unspecified: Secondary | ICD-10-CM

## 2014-07-08 DIAGNOSIS — G4733 Obstructive sleep apnea (adult) (pediatric): Secondary | ICD-10-CM

## 2014-07-08 DIAGNOSIS — F518 Other sleep disorders not due to a substance or known physiological condition: Secondary | ICD-10-CM

## 2014-07-09 NOTE — Sleep Study (Signed)
Please see the scanned sleep study interpretation located in the Procedure tab within the Chart Review section. 

## 2014-07-18 ENCOUNTER — Telehealth: Payer: Self-pay | Admitting: Neurology

## 2014-07-18 DIAGNOSIS — G4733 Obstructive sleep apnea (adult) (pediatric): Secondary | ICD-10-CM

## 2014-07-18 NOTE — Telephone Encounter (Signed)
Please call and notify the patient that the recent sleep study did confirm the diagnosis of obstructive sleep apnea and that I recommend treatment for this in the form of CPAP. This will, ideally, require a repeat sleep study for proper titration and mask fitting. Please explain to patient and arrange for a CPAP titration study. I have placed an order in the chart. Thanks, Star Age, MD, PhD [facility])

## 2014-07-20 ENCOUNTER — Encounter: Payer: Self-pay | Admitting: Neurology

## 2014-07-24 NOTE — Telephone Encounter (Signed)
Patient was contacted and provided the results of her sleep study.  It was explained to the patient that she was diagnosed with overall mild sleep disordered breathing, yet severe OSA while in the deepest stages of sleep.  Patient was informed that a CPAP titration study had been recommended by Dr. Rexene Alberts for treatment.  Patient was unwilling to schedule the appointment while on the phone and requested a copy of the results to be mailed to her.  Evlyn Courier was routed a copy of the results.  Patient was mailed a copy of the report and encouraged to contact our office back to schedule her CPAP study.

## 2014-09-04 ENCOUNTER — Ambulatory Visit: Payer: 59 | Admitting: Nurse Practitioner

## 2014-09-19 ENCOUNTER — Ambulatory Visit (INDEPENDENT_AMBULATORY_CARE_PROVIDER_SITE_OTHER): Payer: 59 | Admitting: Neurology

## 2014-09-19 DIAGNOSIS — G4733 Obstructive sleep apnea (adult) (pediatric): Secondary | ICD-10-CM | POA: Diagnosis not present

## 2014-09-20 NOTE — Sleep Study (Signed)
Please see the scanned sleep study interpretation located in the Procedure tab within the Chart Review section. 

## 2014-09-28 ENCOUNTER — Telehealth: Payer: Self-pay | Admitting: Neurology

## 2014-09-28 DIAGNOSIS — G4733 Obstructive sleep apnea (adult) (pediatric): Secondary | ICD-10-CM

## 2014-09-28 NOTE — Telephone Encounter (Signed)
Please call and inform patient that I have entered an order for treatment with PAP. She did well during the latest sleep study with CPAP. We will, therefore, arrange for a machine for home use through a DME (durable medical equipment) company of Her choice; and I will see the patient back in follow-up in about 6 weeks. Please also explain to the patient that I will be looking out for compliance data downloaded from the machine, which can be done remotely through a modem at times or stored on an SD card in the back of the machine. At the time of the followup appointment we will discuss sleep study results and how it is going with PAP treatment at home. Please advise patient to bring Her machine at the time of the visit; at least for the first visit, even though this is cumbersome. Bringing the machine for every visit after that may not be needed, but often helps for the first visit. Please also make sure, the patient has a follow-up appointment with me in about 6 weeks from the setup date, thanks.   Lache Dagher, MD, PhD [facility] ([facility])  

## 2014-09-29 ENCOUNTER — Encounter: Payer: Self-pay | Admitting: Neurology

## 2014-10-01 ENCOUNTER — Encounter: Payer: Self-pay | Admitting: *Deleted

## 2014-10-01 NOTE — Telephone Encounter (Signed)
Patient was contacted and provided the results of her CPAP titration study that was considered effective in treatment of her OSA.  Numerous question were answered and the patient finally agreed to be referred to a DME company.  A referral was sent to [facility] for processing.  Evlyn Courier was routed a copy of the results.   Patient instructed to contact our office 6-8 weeks post set up to schedule a follow up appointment.  The patient gave verbal permission to mail a copy of her test results.

## 2014-10-04 ENCOUNTER — Telehealth: Payer: Self-pay | Admitting: *Deleted

## 2014-10-04 ENCOUNTER — Ambulatory Visit: Payer: Self-pay | Admitting: Nurse Practitioner

## 2014-10-04 ENCOUNTER — Ambulatory Visit: Payer: 59 | Admitting: Nurse Practitioner

## 2014-10-04 NOTE — Telephone Encounter (Signed)
noted 

## 2014-10-04 NOTE — Telephone Encounter (Signed)
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

## 2014-10-05 ENCOUNTER — Encounter: Payer: Self-pay | Admitting: Nurse Practitioner

## 2014-10-23 ENCOUNTER — Ambulatory Visit (INDEPENDENT_AMBULATORY_CARE_PROVIDER_SITE_OTHER): Payer: 59 | Admitting: Nurse Practitioner

## 2014-10-23 ENCOUNTER — Encounter: Payer: Self-pay | Admitting: Nurse Practitioner

## 2014-10-23 VITALS — BP 108/70 | HR 74 | Ht 64.0 in | Wt 254.6 lb

## 2014-10-23 DIAGNOSIS — R519 Headache, unspecified: Secondary | ICD-10-CM

## 2014-10-23 DIAGNOSIS — H9311 Tinnitus, right ear: Secondary | ICD-10-CM

## 2014-10-23 DIAGNOSIS — R51 Headache: Secondary | ICD-10-CM

## 2014-10-23 DIAGNOSIS — G4733 Obstructive sleep apnea (adult) (pediatric): Secondary | ICD-10-CM

## 2014-10-23 DIAGNOSIS — Z9989 Dependence on other enabling machines and devices: Secondary | ICD-10-CM

## 2014-10-23 MED ORDER — ACETAZOLAMIDE ER 500 MG PO CP12: 500.0000 mg | ORAL_CAPSULE | Freq: Two times a day (BID) | ORAL | Status: DC

## 2014-10-23 NOTE — Progress Notes (Signed)
[facility]  PATIENT: [name] DOB: 1966/02/03   REASON FOR VISIT: Follow-up for headaches and pulsatile tinnitus  HISTORY FROM: Patient    HISTORY OF PRESENT ILLNESS:[name], 49 year old female returns for followup. She has a history of pulsatile tinnitus in the right ear for 3.5 years. She had surgery 02/03/2013 by Dr. Simeon Craft for endoscopic sinus surgery with fusion repair of CSF leak. She has been on Diamox 500 since that time twice daily. She has occasional headaches. She is obese. 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 She complains of sometimes having difficulty focusing. She had  sleep study after her last visit which confirmed obstructive sleep apnea. She is currently on CPAP at 7 cm. She has been on this therapy for about one month  She needs refills on her medication Returns for repeat evaluation.    HISTORY: The patient indicates that she will also occasionally have some headaches over the right frontotemporal area that are mild, occurring once or twice a week. The patient will occasionally have some nausea with the headache and some blurring of vision in the right eye. The patient does have some neck stiffness at times. The patient indicates that when she compresses the neck on the right side, the pulsatile tinnitus goes away. Otherwise, the pulsations are present at all times. The patient has been followed through Dr. Donato Heinz from optometry, and no evidence of papilledema has been noted in the past. The patient last had an examination several months ago. The patient has been seen by Dr. Thornell Mule, and a thorough evaluation was done including MRI of the brain, MRA of the head, and MRV of the head. These studies were unremarkable. The patient is sent to this office for an evaluation. The patient has not noted any other issues such as weakness of the extremities, but she does occasionally have some tingling into the right arm.   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 At her last visit she was started on Topamax for the headache and pulsatile tinnitus. She called in to the office on 01/02/13 saying that she had had clear nasal drainage for over a month. Fluid analysis revealed glucose of 53 suggestive of CS F. She had surgery on 02/03/2013 by Dr. Simeon Craft. Endoscopic sinus surgery with fusion and repair of cerebral spinal fluid leak. She was placed on Diamox 500 twice daily. She is having occasional headaches now. She denies side effects of the Diamox . She is trying to lose weight     REVIEW OF SYSTEMS: Full 14 system review of systems performed and notable only for those listed, all others are neg:  Constitutional: neg  Cardiovascular: neg Ear/Nose/Throat: neg  Skin: neg Eyes: neg Respiratory: neg Gastroitestinal: neg  Hematology/Lymphatic: neg  Endocrine: neg Musculoskeletal:neg Allergy/Immunology: neg Neurological: Occasional headache on the right side Psychiatric: neg Sleep : neg   ALLERGIES: Allergies  Allergen Reactions  . Codeine Nausea And Vomiting  . Hydrocodone Itching    HOME MEDICATIONS: Outpatient Prescriptions Prior to Visit  Medication Sig Dispense Refill  . acetaminophen (TYLENOL) 500 MG tablet Take 500 mg by mouth 2 (two) times daily as needed for pain. Tylenol with aspirin and caffeine.    Marland Kitchen acetaZOLAMIDE (DIAMOX) 500 MG capsule Take 1 capsule (500 mg total) by mouth 2 (two) times daily. 180 capsule 1  . cetirizine (ZYRTEC) 10 MG tablet Take 10 mg by mouth daily.     . famotidine (PEPCID) 20 MG tablet  Take 20 mg by mouth daily.    . ondansetron (ZOFRAN) 4 MG tablet Take 4 mg by mouth every 8 (eight) hours as needed for nausea or vomiting.    . potassium chloride SA (K-DUR,KLOR-CON) 20 MEQ tablet Take 20 mEq by mouth 2 (two) times daily.    . sennosides-docusate sodium (SENOKOT-S) 8.6-50 MG tablet Take 1 tablet by mouth daily.    . benazepril-hydrochlorthiazide (LOTENSIN HCT)  20-25 MG per tablet Take 1 tablet by mouth daily.     No facility-administered medications prior to visit.    PAST MEDICAL HISTORY: Past Medical History  Diagnosis Date  . Allergy     takes Zyrtec daily  . Unspecified vitamin D deficiency   . Lumbago   . Obesity, unspecified   . Pulsatile tinnitus   . Gastroesophageal reflux disease     takes Pepcid daily  . Hypertension     takes Lotensin daily  . Family history of anesthesia complication     mother got confused some after anesthesia  . Peripheral edema   . History of bronchitis     pt states a very long time ago  . Headache(784.0)     benign intercranial HTN d/t CSF leak  . Back pain     arthritis  . Eczema   . Chronic constipation     takes Stool Softener  . History of MRSA infection     > 51yrs ago    PAST SURGICAL HISTORY: Past Surgical History  Procedure Laterality Date  . Gallbladder surgery  50yrs ago  . Cesarean section  77yrs ago  . Sinus endo w/fusion Bilateral 02/03/2013    Procedure: ENDOSCOPIC SINUS SURGERY WITH FUSION NAVIGATION;  Surgeon: Ruby Cola, MD;  Location: [place];  Service: ENT;  Laterality: Bilateral;  Repaired CSF Leak    FAMILY HISTORY: Family History  Problem Relation Age of Onset  . Cancer - Prostate Father   . Hypertension Father   . Hypertension Mother   . Diabetes Mother   . Heart attack Neg Hx   . Hyperlipidemia Neg Hx   . Sudden death Neg Hx     SOCIAL HISTORY: History   Social History  . Marital Status: Married    Spouse Name: Nicole Kindred  . Number of Children: 12  . Years of Education: 12+   Occupational History  .  [employer]   Social History Main Topics  . Smoking status: Never Smoker   . Smokeless tobacco: Never Used  . Alcohol Use: No  . Drug Use: No  . Sexual Activity: Yes   Other Topics Concern  . Not on file   Social History Narrative   Patient lives at home with husband and son. Nicole Kindred)   Patient has 2 children.    Patient is currently working as an  Therapist, sports.   Patient has a college education.    Patient is right handed.      PHYSICAL EXAM  Filed Vitals:   10/23/14 1431  Height: 5\' 4"  (1.626 m)  Weight: 254 lb 9.6 oz (115.486 kg)   Body mass index is 43.68 kg/(m^2). Generalized: Well developed, morbidly obese female in no acute distress  Head: normocephalic and atraumatic,.  Neck: Supple, no carotid bruits ,  Cardiac: Regular rate rhythm, no murmur  Musculoskeletal: No deformity  Neurological examination  Mentation: Alert oriented to time, place, history taking. Follows all commands speech and language fluent. Cranial nerve II-XII:  Fundoscopic exam reveals flat disc margins.Pupils were equal round reactive  to light extraocular movements were full, visual field were full on confrontational test. Facial sensation and strength were normal. hearing was intact to finger rubbing bilaterally. Uvula tongue midline. head turning and shoulder shrug were normal and symmetric.Tongue protrusion into cheek strength was normal.  Motor: normal bulk and tone, full strength in the BUE, BLE, No focal weakness  Coordination: finger-nose-finger, heel-to-shin bilaterally, no dysmetria  Reflexes: Brachioradialis 2/2, biceps 2/2, triceps 2/2, patellar 2/2, Achilles 2/2, plantar responses were flexor bilaterally.  Gait and Station: Rising up from seated position without assistance, normal stance, moderate stride, good arm swing, smooth turning, able to perform tiptoe, and heel walking without difficulty. Tandem gait is steady   DIAGNOSTIC DATA (LABS, IMAGING, TESTING) -  ASSESSMENT AND PLAN  49 y.o. year old female  has a past medical history of Obesity, unspecified; Pulsatile tinnitus;Hypertension;  Headache(784.0); and  obstructive sleep apnea recently diagnosed here to follow-up.The patient is a current patient of Dr. Jannifer Franklin who is out of the office today . This note is sent to the work in doctor.     Continue Diamox at current dose will  refill Fax recent labs to [phone] Continue CPAP at 7 cm of pressure keep follow-up appointment with Dr. Rexene Alberts Follow-up in 6-8 months Dennie Bible, [facility], [facility], [facility] [address] [place], [place] [address] [phone]

## 2014-10-23 NOTE — Patient Instructions (Signed)
Continue Diamox at current dose will refill Fax recent labs to [phone] Continue CPAP at 7 cm of pressure keep follow-up appointment with Dr. Rexene Alberts Follow-up in 6-8 months

## 2014-10-24 ENCOUNTER — Telehealth: Payer: Self-pay | Admitting: Nurse Practitioner

## 2014-10-24 NOTE — Telephone Encounter (Signed)
Reviewed CMP from Dr. Forbes Cellar office done 09/06/14. WNL

## 2014-10-25 NOTE — Progress Notes (Signed)
I have reviewed and agreed above plan. 

## 2014-12-05 ENCOUNTER — Encounter (HOSPITAL_COMMUNITY): Payer: Self-pay | Admitting: Emergency Medicine

## 2014-12-05 ENCOUNTER — Emergency Department (HOSPITAL_COMMUNITY)
Admission: EM | Admit: 2014-12-05 | Discharge: 2014-12-05 | Disposition: A | Discharge status: Home / Self Care | Payer: 59 | Source: Home / Self Care | Attending: Emergency Medicine | Admitting: Emergency Medicine

## 2014-12-05 DIAGNOSIS — J039 Acute tonsillitis, unspecified: Secondary | ICD-10-CM | POA: Diagnosis not present

## 2014-12-05 LAB — POCT RAPID STREP A: Streptococcus, Group A Screen (Direct): NEGATIVE

## 2014-12-05 MED ORDER — AMOXICILLIN 500 MG PO CAPS: 500.0000 mg | ORAL_CAPSULE | Freq: Three times a day (TID) | ORAL | Status: DC

## 2014-12-05 NOTE — ED Notes (Signed)
C/o ST x2 days associated w/odynophagia and fevers Has been gargling salt water w/no relief Alert, no signs of acute distress

## 2014-12-05 NOTE — ED Provider Notes (Signed)
CSN: [id number]     Arrival date & time 12/05/14  5809 History   First MD Initiated Contact with Patient 12/05/14 0900     Chief Complaint  Patient presents with  . Sore Throat   (Consider location/radiation/quality/duration/timing/severity/associated sxs/prior Treatment) HPI  She is a 49 year old woman here for evaluation of sore throat. Her symptoms started 3 days ago and have gradually been worsening. She reports mild associated nasal congestion. She also reports subjective fever and chills.  Her pain is in her throat. It is worse with swallowing. She is able to tolerate liquids. No nausea or vomiting. No cough or shortness of breath. 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  Past Medical History  Diagnosis Date  . Allergy     takes Zyrtec daily  . Unspecified vitamin D deficiency   . Lumbago   . Obesity, unspecified   . Pulsatile tinnitus   . Gastroesophageal reflux disease     takes Pepcid daily  . Hypertension     takes Lotensin daily  . Family history of anesthesia complication     mother got confused some after anesthesia  . Peripheral edema   . History of bronchitis     pt states a very long time ago  . Headache(784.0)     benign intercranial HTN d/t CSF leak  . Back pain     arthritis  . Eczema   . Chronic constipation     takes Stool Softener  . History of MRSA infection     > 67yrs ago   Past Surgical History  Procedure Laterality Date  . Gallbladder surgery  2yrs ago  . Cesarean section  10yrs ago  . Sinus endo w/fusion Bilateral 02/03/2013    Procedure: ENDOSCOPIC SINUS SURGERY WITH FUSION NAVIGATION;  Surgeon: Ruby Cola, MD;  Location: [place];  Service: ENT;  Laterality: Bilateral;  Repaired CSF Leak   Family History  Problem Relation Age of Onset  . Cancer - Prostate Father   . Hypertension Father   . Hypertension Mother   . Diabetes Mother   . Heart attack Neg Hx   . Hyperlipidemia Neg Hx   . Sudden death Neg  Hx    History  Substance Use Topics  . Smoking status: Never Smoker   . Smokeless tobacco: Never Used  . Alcohol Use: No   OB History    No data available     Review of Systems As in history of present illness Allergies  Codeine and Hydrocodone  Home Medications   Prior to Admission medications   Medication Sig Start Date End Date Taking? Authorizing Provider  acetaZOLAMIDE (DIAMOX) 500 MG capsule Take 1 capsule (500 mg total) by mouth 2 (two) times daily. 10/23/14  Yes Dennie Bible, NP  acetaminophen (TYLENOL) 500 MG tablet Take 500 mg by mouth 2 (two) times daily as needed for pain. Tylenol with aspirin and caffeine.    Historical Provider, MD  amoxicillin (AMOXIL) 500 MG capsule Take 1 capsule (500 mg total) by mouth 3 (three) times daily. 12/05/14   Melony Overly, MD  benazepril-hydrochlorthiazide (LOTENSIN HCT) 20-25 MG per tablet Take 1 tablet by mouth daily.    Historical Provider, MD  cetirizine (ZYRTEC) 10 MG tablet Take 10 mg by mouth daily.     Historical Provider, MD  famotidine (PEPCID) 20 MG tablet Take 20 mg by mouth daily.    Historical Provider, MD  ondansetron (ZOFRAN) 4 MG  tablet Take 4 mg by mouth every 8 (eight) hours as needed for nausea or vomiting.    Historical Provider, MD  potassium chloride SA (K-DUR,KLOR-CON) 20 MEQ tablet Take 20 mEq by mouth 2 (two) times daily.    Historical Provider, MD  sennosides-docusate sodium (SENOKOT-S) 8.6-50 MG tablet Take 1 tablet by mouth daily.    Historical Provider, MD   BP 102/70 mmHg  Pulse 73  Temp(Src) 98 F (36.7 C) (Oral)  Resp 16  SpO2 100% Physical Exam  Constitutional: She is oriented to person, place, and time. She appears well-developed and well-nourished. No distress.  HENT:  Nose: Nose normal.  Mouth/Throat: Oropharyngeal exudate present.  Tonsils are erythematous and swollen. There is exudate.  Eyes: Conjunctivae are normal.  Neck: Neck supple.  Cardiovascular: Normal rate, regular rhythm and  normal heart sounds.   No murmur heard. Pulmonary/Chest: Effort normal and breath sounds normal. No respiratory distress. She has no wheezes. She has no rales.  Lymphadenopathy:    She has cervical adenopathy (bilateral posterior chain).  Neurological: She is oriented to person, place, and time.    ED Course  Procedures (including critical care time) Labs Review Labs Reviewed  POCT RAPID STREP A    Imaging Review No results found.   MDM   1. Tonsillitis    With her history of a CSF leak, I will treat with antibiotics. Amoxicillin for 1 week. Symptomatic care discussed. Follow-up as needed.    Melony Overly, MD 12/05/14 [phone]

## 2014-12-05 NOTE — Discharge Instructions (Signed)
You have an infection of your tonsils. Take amoxicillin 3 times a day for the next week. You can continue the Tylenol body aches as needed. Continue salt water gargles. You can use Chloraseptic spray as needed to help with throat discomfort. Follow-up as needed.

## 2014-12-07 LAB — CULTURE, GROUP A STREP: STREP A CULTURE: POSITIVE — AB

## 2014-12-10 NOTE — ED Notes (Signed)
Lab reports positive for strep, treatment adequate w Rx provided

## 2014-12-11 ENCOUNTER — Encounter: Payer: Self-pay | Admitting: Neurology

## 2014-12-11 ENCOUNTER — Ambulatory Visit (INDEPENDENT_AMBULATORY_CARE_PROVIDER_SITE_OTHER): Payer: 59 | Admitting: Neurology

## 2014-12-11 ENCOUNTER — Telehealth: Payer: Self-pay | Admitting: *Deleted

## 2014-12-11 VITALS — BP 112/68 | HR 68 | Resp 16 | Ht 64.0 in | Wt 250.0 lb

## 2014-12-11 DIAGNOSIS — H9311 Tinnitus, right ear: Secondary | ICD-10-CM | POA: Diagnosis not present

## 2014-12-11 DIAGNOSIS — Z9989 Dependence on other enabling machines and devices: Principal | ICD-10-CM

## 2014-12-11 DIAGNOSIS — G4733 Obstructive sleep apnea (adult) (pediatric): Secondary | ICD-10-CM | POA: Diagnosis not present

## 2014-12-11 DIAGNOSIS — E669 Obesity, unspecified: Secondary | ICD-10-CM

## 2014-12-11 NOTE — Patient Instructions (Addendum)
Please continue using your CPAP regularly. While your insurance requires that you use CPAP at least 4 hours each night on 70% of the nights, I recommend, that you not skip any nights and use it throughout the night if you can. Getting used to CPAP and staying with the treatment long term does take time and patience and discipline. Untreated obstructive sleep apnea when it is moderate to severe can have an adverse impact on cardiovascular health and raise her risk for heart disease, arrhythmias, hypertension, congestive heart failure, stroke and diabetes. Untreated obstructive sleep apnea causes sleep disruption, nonrestorative sleep, and sleep deprivation. This can have an impact on your day to day functioning and cause daytime sleepiness and impairment of cognitive function, memory loss, mood disturbance, and problems focussing. Using CPAP regularly can improve these symptoms.  Keep up the good work! I will see you back in 6 months for sleep apnea check up.   Continue to strive for weight loss, good job there too!

## 2014-12-11 NOTE — Telephone Encounter (Signed)
Patient form on [name] desk.

## 2014-12-11 NOTE — Progress Notes (Signed)
Subjective:    Patient ID: [name] is a 49 y.o. female.  HPI      Dear [name] and Lanny Hurst,   I saw your patient, [name], upon your kind request in my clinic today for initial consultation of her sleep disorder, after her recent sleep studies. The patient is unaccompanied today. 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 The patient had a baseline sleep study followed by a CPAP titration study. I went over her test results with her in detail today. Her baseline sleep study from 07/08/2014 showed a sleep efficiency of 75.4% with a latency to sleep of 17.5 minutes and wake after sleep onset of 77.5 minutes with mild sleep fragmentation noted. She had an increased percentage of stage II sleep, a decreased percentage of slow-wave sleep, and mildly increased percentage of REM sleep with a normal REM latency. She had rare PVCs on EKG, otherwise normal EEG and no significant PLMS. She had mild to moderate snoring. Total AHI was 10.7 per hour, rising to 38.5 per hour during REM sleep. Average oxygen saturation was 95%, nadir was 81%. Based on a sleep study results she was invited back for a CPAP titration study. She had this on 09/19/2014. Sleep efficiency was 88.9%. Latency to sleep was 12 minutes and wake after sleep onset was 32 minutes with mild sleep fragmentation noted. She had a normal arousal index. She had normal percentages of stage I and stage II sleep, an increased percentage of slow-wave sleep and mildly reduced percentage of REM sleep with a normal REM latency. She had no significant PLMS or EKG or EEG changes. Snoring was eliminated. Average oxygen saturation was 96%, nadir was 91%. CPAP was titrated from 5-7 cm.  AHI was 0 per hour on the final pressure with supine REM sleep achieved. Based on her test results I prescribed CPAP therapy for home use. Today, 12/11/2014: I reviewed her CPAP compliance data from 11/10/2014 through 12/09/2014 which is a total of 30 days during which time she used her machine 28 days with percent used days greater than 4 hours at 67%, indicating suboptimal compliance with an average usage for all days of 4 hours and 26 minutes. Residual AHI low at 0.1 per hour and leak low with the 95th percentile of leak at 11.3 L/m on a pressure of 7 cm. Today, 12/11/2014: She reports that she has adjusted fairly well to CPAP therapy. She works nights. She works usually 3-4 nights out of the week. She usually works from 7 PM to 7 AM. Usually she sleeps during the day. On her days off she tries to sleep at night. Sometimes this makes using CPAP difficult. Overall, she is pleased with how she is doing. She reports improved headaches, slightly improved right-sided tinnitus, and more daytime energy or nighttime energy when she works. She does drink 1 soda usually per night when she works. She does not drink caffeine daily. She does not smoke or drink alcohol. She has no family history of obstructive sleep apnea. She does not endorse restless leg symptoms. She went to urgent care on 12/05/2014 with a sore throat. She was diagnosed with tonsillitis. She has a history of recurrent tonsillitis growing up. She has been working on weight loss by cutting  back on portion sizes and watching what she eats. She tries to walk regularly. She has been able to lose over 20 pounds in the last 2 years or so.  Her Past Medical History Is Significant For: Past Medical History  Diagnosis Date  . Allergy     takes Zyrtec daily  . Unspecified vitamin D deficiency   . Lumbago   . Obesity, unspecified   . Pulsatile tinnitus   . Gastroesophageal reflux disease     takes Pepcid daily  . Hypertension     takes Lotensin daily   . Family history of anesthesia complication     mother got confused some after anesthesia  . Peripheral edema   . History of bronchitis     pt states a very long time ago  . Headache(784.0)     benign intercranial HTN d/t CSF leak  . Back pain     arthritis  . Eczema   . Chronic constipation     takes Stool Softener  . History of MRSA infection     > 21yrs ago    Her Past Surgical History Is Significant For: Past Surgical History  Procedure Laterality Date  . Gallbladder surgery  37yrs ago  . Cesarean section  30yrs ago  . Sinus endo w/fusion Bilateral 02/03/2013    Procedure: ENDOSCOPIC SINUS SURGERY WITH FUSION NAVIGATION;  Surgeon: Ruby Cola, MD;  Location: [facility];  Service: ENT;  Laterality: Bilateral;  Repaired CSF Leak    Her Family History Is Significant For: Family History  Problem Relation Age of Onset  . Cancer - Prostate Father   . Hypertension Father   . Hypertension Mother   . Diabetes Mother   . Heart attack Neg Hx   . Hyperlipidemia Neg Hx   . Sudden death Neg Hx     Her Social History Is Significant For: History   Social History  . Marital Status: Married    Spouse Name: Nicole Kindred  . Number of Children: 12  . Years of Education: 12+   Occupational History  .  [employer]   Social History Main Topics  . Smoking status: Never Smoker   . Smokeless tobacco: Never Used  . Alcohol Use: No  . Drug Use: No  . Sexual Activity: Yes   Other Topics Concern  . None   Social History Narrative   Patient lives at home with husband and son. Nicole Kindred)   Patient has 2 children.    Patient is currently working as an Therapist, sports.   Patient has a college education.    Patient is right handed.    Consumes drinks 1 16oz soda 3 times a week, rarely drinks coffee.    Works night shift    Her Allergies Are:  Allergies  Allergen Reactions  . Codeine Nausea And Vomiting  . Hydrocodone Itching  :   Her Current Medications Are:  Outpatient Encounter Prescriptions as of  12/11/2014  Medication Sig  . acetaminophen (TYLENOL) 500 MG tablet Take 500 mg by mouth 2 (two) times daily as needed for pain. Tylenol with aspirin and caffeine.  Marland Kitchen acetaZOLAMIDE (DIAMOX) 500 MG capsule Take 1 capsule (500 mg total) by mouth 2 (two) times daily.  Marland Kitchen amoxicillin (AMOXIL) 500 MG capsule Take 1 capsule (500 mg total) by mouth 3 (three) times daily.  . benazepril-hydrochlorthiazide (LOTENSIN HCT) 20-25 MG per tablet Take 1 tablet by mouth daily.  . cetirizine (ZYRTEC) 10 MG tablet Take 10 mg by mouth daily.   Marland Kitchen  famotidine (PEPCID) 20 MG tablet Take 20 mg by mouth daily.  . ondansetron (ZOFRAN) 4 MG tablet Take 4 mg by mouth every 8 (eight) hours as needed for nausea or vomiting.  . potassium chloride SA (K-DUR,KLOR-CON) 20 MEQ tablet Take 20 mEq by mouth 2 (two) times daily.  . sennosides-docusate sodium (SENOKOT-S) 8.6-50 MG tablet Take 1 tablet by mouth daily.   No facility-administered encounter medications on file as of 12/11/2014.  :  Review of Systems:  Out of a complete 14 point review of systems, all are reviewed and negative with the exception of these symptoms as listed below:   Review of Systems  Neurological:       Patient feels that she is doing better on CPAP. She worried about compliance due to her sleeping during day time hours.     Objective:  Neurologic Exam  Physical Exam Physical Examination:   Filed Vitals:   12/11/14 1428  BP: 112/68  Pulse: 68  Resp: 16   General Examination: The patient is a very pleasant 49 y.o. female in no acute distress. She appears well-developed and well-nourished and well groomed.   HEENT: Normocephalic, atraumatic, pupils are equal, round and reactive to light and accommodation. Funduscopic exam is normal with sharp disc margins noted. Extraocular tracking is good without limitation to gaze excursion or nystagmus noted. Normal smooth pursuit is noted. Hearing is grossly intact. Tympanic membranes are clear bilaterally.  Face is symmetric with normal facial animation and normal facial sensation. Speech is clear with no dysarthria noted. There is no hypophonia. There is no lip, neck/head, jaw or voice tremor. Neck is supple with full range of passive and active motion. There are no carotid bruits on auscultation. Oropharynx exam reveals: mild mouth dryness, good dental hygiene and moderate airway crowding, due to narrow airway entry and tonsils are 1+ bilaterally. She has mild pharyngeal erythema. Mallampati is class II. Tongue protrudes centrally and palate elevates symmetrically. Neck size is 15.75 inches. She has a mild overbite. Nasal inspection reveals no significant nasal mucosal bogginess or redness and no septal deviation.   Chest: Clear to auscultation without wheezing, rhonchi or crackles noted.  Heart: S1+S2+0, regular and normal without murmurs, rubs or gallops noted.   Abdomen: Soft, non-tender and non-distended with normal bowel sounds appreciated on auscultation.  Extremities: There is 1+ pitting edema in the distal lower extremities bilaterally. Pedal pulses are intact.  Skin: Warm and dry without trophic changes noted. There are no varicose veins.  Musculoskeletal: exam reveals no obvious joint deformities, tenderness or joint swelling or erythema.   Neurologically:  Mental status: The patient is awake, alert and oriented in all 4 spheres. Her immediate and remote memory, attention, language skills and fund of knowledge are appropriate. There is no evidence of aphasia, agnosia, apraxia or anomia. Speech is clear with normal prosody and enunciation. Thought process is linear. Mood is normal and affect is normal.  Cranial nerves II - XII are as described above under HEENT exam. In addition: shoulder shrug is normal with equal shoulder height noted. Motor exam: Normal bulk, strength and tone is noted. There is no drift, tremor or rebound. Romberg is negative. Reflexes are 2+ throughout. Babinski: Toes  are flexor bilaterally. Fine motor skills and coordination: intact with normal finger taps, normal hand movements, normal rapid alternating patting, normal foot taps and normal foot agility.  Cerebellar testing: No dysmetria or intention tremor on finger to nose testing. Heel to shin is unremarkable bilaterally. There is no  truncal or gait ataxia.  Sensory exam: intact to light touch, pinprick, vibration, temperature sense in the upper and lower extremities.  Gait, station and balance: She stands easily. No veering to one side is noted. No leaning to one side is noted. Posture is age-appropriate and stance is narrow based. Gait shows normal stride length and normal pace. No problems turning are noted.                Assessment and Plan:   In summary, [name] is a very pleasant 49 y.o.-year old female with an underlying medical history of allergies, morbid obesity, recurrent headaches, hypertension and reflux disease, who had a sleep study in January 2016, followed by a CPAP titration study in March 2016. She has overall mild obstructive sleep apnea, more pronounced during REM sleep. She has been placed on CPAP therapy at a pressure of 7 cm after her recent sleep test. Today, we discussed her sleep-related history, and her sleep study results in detail. She is advised about her CPAP compliance as well. I congratulated her on her weight loss endeavors and her weight loss success thus far. She has a nonfocal neurological exam and is reassured in that regard. She reports some improvement of her sleep-related symptoms and her headaches as well as her daytime somnolence after starting CPAP therapy and is motivated to continue. 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 She is encouraged to try to use CPAP without skipping any days and throughout her sleep if possible. Her overall compliance is slightly suboptimal but she is encouraged to  continue to improve her usage. Her leak is low. Her residual AHI is negligible. At this juncture, I can see her back in about 6 months for a routine sleep apnea follow-up. She is in the interim encouraged to continue CPAP regularly without skipping any days and to use it as long as she can throughout her sleep period. She is advised to call with any interim questions and also encouraged to continue to work on her weight loss.  I explained to her the risks and ramifications of untreated moderate to severe OSA, especially with respect to developing cardiovascular disease down the Road, including congestive heart failure, difficult to treat hypertension, cardiac arrhythmias, or stroke. Even type 2 diabetes has, in part, been linked to untreated OSA. Symptoms of untreated OSA include daytime sleepiness, memory problems, mood irritability and mood disorder such as depression and anxiety, lack of energy, as well as recurrent headaches, especially morning headaches. We talked about trying to maintain a healthy lifestyle in general, as well as the importance of weight control.  I explained the importance of being compliant with PAP treatment, not only for insurance purposes but primarily to improve Her symptoms, and for the patient's long term health benefit, including to reduce Her cardiovascular risks. I answered all her questions today and the patient was in agreement.   Thank you very much for allowing me to participate in the care of this nice patient. If I can be of any further assistance to you please do not hesitate to talk to me.   Sincerely,   Star Age, MD, PhD  I spent 25 minutes in total face-to-face time with the patient, more than 50% of which was spent in counseling and coordination of care, reviewing test results, reviewing medication and discussing or reviewing the diagnosis of OSA, its prognosis and treatment options.

## 2014-12-12 DIAGNOSIS — Z0289 Encounter for other administrative examinations: Secondary | ICD-10-CM

## 2014-12-12 NOTE — Telephone Encounter (Signed)
I called and LMVM for pt to return call relating to FMLA form she left with [place].

## 2014-12-13 NOTE — Telephone Encounter (Signed)
LMVM for pt to return call.   

## 2014-12-17 NOTE — Telephone Encounter (Signed)
I spoke to pt and she stated she had mentioned to [place], NP when in last and to bring by the form.  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  Taking diamox.  See's [place] every 6 mo or so.  She has episodes when she may get headaches and this causes her dizziness, nausea, imbalance.  This does not happen very often, and thinks about 1-2 mon, maybe not even that much.   I filled out and will send to CM/NP for signature.   To fax to [employer] and copy to pt.

## 2014-12-17 NOTE — Telephone Encounter (Signed)
I faxed to [employer], [phone] FMLA.  (successful transmission).  LMVM for pt that faxed form to [employer].  Copy to be mailed to her.

## 2014-12-17 NOTE — Telephone Encounter (Signed)
Patient called/returning Sandra's call. Please call and advise. Patient can be reached @ [phone] or [phone]

## 2014-12-18 ENCOUNTER — Telehealth: Payer: Self-pay | Admitting: *Deleted

## 2014-12-18 NOTE — Telephone Encounter (Signed)
Form,[employer],received,completed by Orpah Clinton sent a copy to patient 12/18/14.

## 2015-05-08 ENCOUNTER — Encounter: Payer: Self-pay | Admitting: Nurse Practitioner

## 2015-05-08 ENCOUNTER — Ambulatory Visit (INDEPENDENT_AMBULATORY_CARE_PROVIDER_SITE_OTHER): Payer: 59 | Admitting: Nurse Practitioner

## 2015-05-08 ENCOUNTER — Ambulatory Visit: Payer: 59 | Admitting: Nurse Practitioner

## 2015-05-08 VITALS — BP 132/83 | HR 71 | Ht 64.0 in | Wt 259.6 lb

## 2015-05-08 DIAGNOSIS — H93A9 Pulsatile tinnitus, unspecified ear: Secondary | ICD-10-CM

## 2015-05-08 DIAGNOSIS — Z9989 Dependence on other enabling machines and devices: Principal | ICD-10-CM

## 2015-05-08 DIAGNOSIS — G4733 Obstructive sleep apnea (adult) (pediatric): Secondary | ICD-10-CM

## 2015-05-08 DIAGNOSIS — R519 Headache, unspecified: Secondary | ICD-10-CM

## 2015-05-08 DIAGNOSIS — R51 Headache: Secondary | ICD-10-CM

## 2015-05-08 MED ORDER — ACETAZOLAMIDE ER 500 MG PO CP12: 500.0000 mg | ORAL_CAPSULE | Freq: Two times a day (BID) | ORAL | Status: DC

## 2015-05-08 MED ORDER — TOPIRAMATE 25 MG PO TABS: 25.0000 mg | ORAL_TABLET | Freq: Every day | ORAL | Status: DC

## 2015-05-08 NOTE — Progress Notes (Signed)
I have read the note, and I agree with the clinical assessment and plan.  WILLIS,CHARLES KEITH   

## 2015-05-08 NOTE — Patient Instructions (Signed)
For headaches try Topamax 25 mg at night for one week then increase to 2 tabs at night Call me if this is not effective Continue Diamox 500 mg twice daily Continue CPAP at 7 cm for obstructive sleep apnea Follow-up in 3-4 months

## 2015-05-08 NOTE — Progress Notes (Signed)
[facility]  PATIENT: [name] DOB: [birth year]   REASON FOR VISIT: Follow-up for pulsatile tinnitus on the right, worsening of headaches, obstructive sleep apnea on CPAP HISTORY FROM: Patient    HISTORY OF PRESENT ILLNESS:[name], 49 year old female returns for followup. 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 She had surgery 02/03/2013 by Dr. Simeon Craft for endoscopic sinus surgery with fusion repair of CSF leak. She has been on Diamox 500 since that time twice daily. She has occasional headaches which have worsened recently. Pain starts around the right and involves the right side of the head. She also is sensitive to lights and sound and she gets nauseated but does not vomit.  She has not been on a migraine preventive in a while. She is obese but has lost about 40 pounds over the last couple of years. 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 She complains of sometimes having difficulty focusing. She had sleep study  which confirmed obstructive sleep apnea. She is currently on CPAP at 7 cm. She has been on this therapy for about 7  months.  She needs refills on her medication Returns for repeat evaluation.   HISTORY: The patient indicates that she will also occasionally have some headaches over the right frontotemporal area that are mild, occurring once or twice a week. The patient will occasionally have some nausea with the headache and some blurring of vision in the right eye. The patient does have some neck stiffness at times. The patient indicates that when she compresses the neck on the right side, the pulsatile tinnitus goes away. Otherwise, the pulsations are present at all times. The patient has been followed through Dr. Donato Heinz from optometry, and no evidence of papilledema has been noted in the past. The patient last had an examination several months ago. The patient has been seen by Dr. Thornell Mule, and a  thorough evaluation was done including MRI of the brain, MRA of the head, and MRV of the head. These studies were unremarkable. The patient is sent to this office for an evaluation. The patient has not noted any other issues such as weakness of the extremities, but she does occasionally have some tingling into the right arm.  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 At her last visit she was started on Topamax for the headache and pulsatile tinnitus. She called in to the office on 01/02/13 saying that she had had clear nasal drainage for over a month. Fluid analysis revealed glucose of 53 suggestive of CS F. She had surgery on 02/03/2013 by Dr. Simeon Craft. Endoscopic sinus surgery with fusion and repair of cerebral spinal fluid leak. She was placed on Diamox 500 twice daily. She is having occasional headaches now. She denies side effects of the Diamox . She is trying to lose weight    REVIEW OF SYSTEMS: Full 14 system review of systems performed and notable only for those listed, all others are neg:  Constitutional: neg  Cardiovascular: neg Ear/Nose/Throat: neg  Skin: neg Eyes: Occasional redness Respiratory: neg Gastroitestinal: Nausea  Hematology/Lymphatic: neg  Endocrine: neg Musculoskeletal:neg Allergy/Immunology: neg Neurological: Headaches Psychiatric: neg Sleep : Obstructive sleep apnea with CPAP ALLERGIES: Allergies  Allergen Reactions  . Codeine Nausea And Vomiting  . Hydrocodone Itching    HOME MEDICATIONS: Outpatient Prescriptions Prior to Visit  Medication Sig Dispense Refill  . acetaminophen (TYLENOL) 500 MG tablet Take 500 mg by mouth 2 (two)  times daily as needed for pain. Tylenol with aspirin and caffeine.    Marland Kitchen acetaZOLAMIDE (DIAMOX) 500 MG capsule Take 1 capsule (500 mg total) by mouth 2 (two) times daily. 180 capsule 2  . benazepril-hydrochlorthiazide (LOTENSIN HCT) 20-25 MG per tablet Take 1 tablet by mouth daily.    . cetirizine (ZYRTEC) 10 MG tablet Take 10 mg  by mouth daily.     . famotidine (PEPCID) 20 MG tablet Take 20 mg by mouth daily.    . ondansetron (ZOFRAN) 4 MG tablet Take 4 mg by mouth every 8 (eight) hours as needed for nausea or vomiting.    . potassium chloride SA (K-DUR,KLOR-CON) 20 MEQ tablet Take 20 mEq by mouth 2 (two) times daily.    . sennosides-docusate sodium (SENOKOT-S) 8.6-50 MG tablet Take 1 tablet by mouth daily.    Marland Kitchen amoxicillin (AMOXIL) 500 MG capsule Take 1 capsule (500 mg total) by mouth 3 (three) times daily. (Patient not taking: Reported on 05/08/2015) 21 capsule 0   No facility-administered medications prior to visit.    PAST MEDICAL HISTORY: Past Medical History  Diagnosis Date  . Allergy     takes Zyrtec daily  . Unspecified vitamin D deficiency   . Lumbago   . Obesity, unspecified   . Pulsatile tinnitus   . Gastroesophageal reflux disease     takes Pepcid daily  . Hypertension     takes Lotensin daily  . Family history of anesthesia complication     mother got confused some after anesthesia  . Peripheral edema   . History of bronchitis     pt states a very long time ago  . Headache(784.0)     benign intercranial HTN d/t CSF leak  . Back pain     arthritis  . Eczema   . Chronic constipation     takes Stool Softener  . History of MRSA infection     > 58yrs ago    PAST SURGICAL HISTORY: Past Surgical History  Procedure Laterality Date  . Gallbladder surgery  81yrs ago  . Cesarean section  47yrs ago  . Sinus endo w/fusion Bilateral 02/03/2013    Procedure: ENDOSCOPIC SINUS SURGERY WITH FUSION NAVIGATION;  Surgeon: Ruby Cola, MD;  Location: [facility];  Service: ENT;  Laterality: Bilateral;  Repaired CSF Leak    FAMILY HISTORY: Family History  Problem Relation Age of Onset  . Cancer - Prostate Father   . Hypertension Father   . Hypertension Mother   . Diabetes Mother   . Heart attack Neg Hx   . Hyperlipidemia Neg Hx   . Sudden death Neg Hx     SOCIAL HISTORY: Social History   Social  History  . Marital Status: Married    Spouse Name: Nicole Kindred  . Number of Children: 12  . Years of Education: 12+   Occupational History  .  [employer]   Social History Main Topics  . Smoking status: Never Smoker   . Smokeless tobacco: Never Used  . Alcohol Use: No  . Drug Use: No  . Sexual Activity: Yes   Other Topics Concern  . Not on file   Social History Narrative   Patient lives at home with husband and son. Nicole Kindred)   Patient has 2 children.    Patient is currently working as an Therapist, sports.   Patient has a college education.    Patient is right handed.    Consumes drinks 1 16oz soda 3 times a week, rarely drinks coffee.  Works night shift     PHYSICAL EXAM  Filed Vitals:   05/08/15 1130  BP: 132/83  Pulse: 71  Height: 5\' 4"  (1.626 m)  Weight: 259 lb 9.6 oz (117.754 kg)   Body mass index is 44.54 kg/(m^2). Generalized: Well developed, morbidly obese female in no acute distress  Head: normocephalic and atraumatic,.  Neck: Supple, no carotid bruits ,  Cardiac: Regular rate rhythm, no murmur  Musculoskeletal: No deformity  Neurological examination  Mentation: Alert oriented to time, place, history taking. Follows all commands speech and language fluent. Cranial nerve II-XII: Fundoscopic exam reveals sharp disc margins.Pupils were equal round reactive to light extraocular movements were full, visual field were full on confrontational test. Facial sensation and strength were normal. hearing was intact to finger rubbing bilaterally. Uvula tongue midline. head turning and shoulder shrug were normal and symmetric.Tongue protrusion into cheek strength was normal.  Motor: normal bulk and tone, full strength in the BUE, BLE, No focal weakness  Coordination: finger-nose-finger, heel-to-shin bilaterally, no dysmetria  Reflexes: Brachioradialis 2/2, biceps 2/2, triceps 2/2, patellar 2/2, Achilles 2/2, plantar responses were flexor bilaterally.  Gait and Station: Rising up  from seated position without assistance, normal stance, moderate stride, good arm swing, smooth turning, able to perform tiptoe, and heel walking without difficulty. Tandem gait is steady    DIAGNOSTIC DATA (LABS, IMAGING, TESTING) -  ASSESSMENT AND PLAN  49 y.o. year old female  has a past medical history of Allergy;  Obesity, unspecified; Pulsatile tinnitus; Gastroesophageal reflux disease; Hypertension;  Headache(784.0); Back pain;  here to follow-up.  For headaches try Topamax 25 mg at night for one week then increase to 2 tabs at night Call me if this is not effective Continue Diamox 500 mg twice daily will refill Continue CPAP at 7 cm for obstructive sleep apnea Follow-up in 3-4 months Dennie Bible, [facility], [facility], [facility] [address] [place], [place] [address] [phone]

## 2015-05-16 ENCOUNTER — Other Ambulatory Visit: Payer: Self-pay | Admitting: Obstetrics and Gynecology

## 2015-05-16 DIAGNOSIS — R928 Other abnormal and inconclusive findings on diagnostic imaging of breast: Secondary | ICD-10-CM

## 2015-05-22 ENCOUNTER — Ambulatory Visit
Admission: RE | Admit: 2015-05-22 | Discharge: 2015-05-22 | Disposition: A | Discharge status: Home / Self Care | Payer: 59 | Source: Ambulatory Visit | Attending: Obstetrics and Gynecology | Admitting: Obstetrics and Gynecology

## 2015-05-22 DIAGNOSIS — R928 Other abnormal and inconclusive findings on diagnostic imaging of breast: Secondary | ICD-10-CM

## 2015-06-12 ENCOUNTER — Ambulatory Visit: Payer: 59 | Admitting: Neurology

## 2015-06-12 ENCOUNTER — Telehealth: Payer: Self-pay | Admitting: *Deleted

## 2015-06-12 NOTE — Telephone Encounter (Signed)
Form,[employer],received from Albert Lea sent to [place] and Hoyle Sauer 06/12/15.

## 2015-06-13 NOTE — Telephone Encounter (Signed)
Form completed.  Sent ot CM/NP for review and signature.

## 2015-06-13 NOTE — Telephone Encounter (Signed)
Form,[employer] received,completed by Hoyle Sauer and Sandy faxed,mailed copy to patient 06/13/15.

## 2015-06-13 NOTE — Telephone Encounter (Signed)
Reviewed and signed

## 2015-06-13 NOTE — Telephone Encounter (Signed)
To MR. 

## 2015-06-14 DIAGNOSIS — Z0289 Encounter for other administrative examinations: Secondary | ICD-10-CM

## 2015-06-24 ENCOUNTER — Encounter: Payer: Self-pay | Admitting: Neurology

## 2015-06-24 ENCOUNTER — Ambulatory Visit (INDEPENDENT_AMBULATORY_CARE_PROVIDER_SITE_OTHER): Payer: 59 | Admitting: Neurology

## 2015-06-24 VITALS — BP 114/70 | HR 78 | Resp 16 | Ht 64.0 in | Wt 254.0 lb

## 2015-06-24 DIAGNOSIS — G4733 Obstructive sleep apnea (adult) (pediatric): Secondary | ICD-10-CM | POA: Diagnosis not present

## 2015-06-24 DIAGNOSIS — E669 Obesity, unspecified: Secondary | ICD-10-CM

## 2015-06-24 DIAGNOSIS — Z9989 Dependence on other enabling machines and devices: Principal | ICD-10-CM

## 2015-06-24 NOTE — Progress Notes (Signed)
Subjective:    Patient ID: [name] is a 49 y.o. female.  HPI     Interim history:  [name] is a 49 year old right-handed woman with an underlying medical history of allergies, morbid obesity, recurrent headaches, hypertension and reflux disease, who presents for follow-up consultation of her obstructive sleep apnea (overall mild, severe REM related), treated with CPAP. The patient is unaccompanied today. I first met her on 12/11/2014 at the request of Cecille Rubin, NP and Dr. Jannifer Franklin, at which time I talked to the patient about her sleep study results. Patient started CPAP therapy. She had generally been slightly suboptimal with her CPAP compliance. She was encouraged to continue with treatment.   Today, 06/24/2015: I reviewed her CPAP compliance data from 05/21/2015 through 06/19/2015 which is a total of 30 days during which time she used her CPAP every night with percent used days greater than 4 hours at 40% only, indicating suboptimal compliance with an average usage of 4 hours and 1 minutes, residual AHI low at 0.2 per hour, leak acceptable with the 95th percentile at 13.7 L/m on a pressure of 7 cm.  Today, 06/24/2015: She reports overall doing well. 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 She has sometimes trouble keeping the mask on. Nevertheless, she has been tolerating it well and has no complaints about CPAP therapy. She is trying to work on weight loss and has lost a little bit. She is going to have a hysterectomy and bladder sling operation next year.  Previously:  She had been referred for sleep study due to her report of recurrent headaches and difficulty maintaining sleep in light of shift work. The patient had a baseline sleep study followed by a CPAP titration study. I went over her test results with her in detail today. Her baseline sleep study from 07/08/2014 showed a sleep efficiency of 75.4% with a latency to sleep of 17.5 minutes and wake after sleep onset of  77.5 minutes with mild sleep fragmentation noted. She had an increased percentage of stage II sleep, a decreased percentage of slow-wave sleep, and mildly increased percentage of REM sleep with a normal REM latency. She had rare PVCs on EKG, otherwise normal EEG and no significant PLMS. She had mild to moderate snoring. Total AHI was 10.7 per hour, rising to 38.5 per hour during REM sleep. Average oxygen saturation was 95%, nadir was 81%. Based on a sleep study results she was invited back for a CPAP titration study. She had this on 09/19/2014. Sleep efficiency was 88.9%. Latency to sleep was 12 minutes and wake after sleep onset was 32 minutes with mild sleep fragmentation noted. She had a normal arousal index. She had normal percentages of stage I and stage II sleep, an increased percentage of slow-wave sleep and mildly reduced percentage of REM sleep with a normal REM latency. She had no significant PLMS or EKG or EEG changes. Snoring was eliminated. Average oxygen saturation was 96%, nadir was 91%. CPAP was titrated from 5-7 cm. AHI was 0 per hour on the final pressure with supine REM sleep achieved. Based on her test results I prescribed CPAP therapy for home use.  I reviewed her CPAP compliance data from 11/10/2014 through 12/09/2014 which is a total of 30 days during which time she used her machine 28 days with percent used days greater than 4 hours at 67%, indicating suboptimal compliance with an average usage for all days of 4 hours and 26 minutes. Residual AHI low at  0.1 per hour and leak low with the 95th percentile of leak at 11.3 L/m on a pressure of 7 cm. Today, 12/11/2014: She reports that she has adjusted fairly well to CPAP therapy. She works nights. She works usually 3-4 nights out of the week. She usually works from 7 PM to 7 AM. Usually she sleeps during the day. On her days off she tries to sleep at night. Sometimes this makes using CPAP difficult. Overall, she is pleased with how she is  doing. She reports improved headaches, slightly improved right-sided tinnitus, and more daytime energy or nighttime energy when she works. She does drink 1 soda usually per night when she works. She does not drink caffeine daily. She does not smoke or drink alcohol. She has no family history of obstructive sleep apnea. She does not endorse restless leg symptoms. She went to urgent care on 12/05/2014 with a sore throat. She was diagnosed with tonsillitis. She has a history of recurrent tonsillitis growing up. She has been working on weight loss by cutting back on portion sizes and watching what she eats. She tries to walk regularly. She has been able to lose over 20 pounds in the last 2 years or so.  Her Past Medical History Is Significant For: Past Medical History  Diagnosis Date  . Allergy     takes Zyrtec daily  . Unspecified vitamin D deficiency   . Lumbago   . Obesity, unspecified   . Pulsatile tinnitus   . Gastroesophageal reflux disease     takes Pepcid daily  . Hypertension     takes Lotensin daily  . Family history of anesthesia complication     mother got confused some after anesthesia  . Peripheral edema   . History of bronchitis     pt states a very long time ago  . Headache(784.0)     benign intercranial HTN d/t CSF leak  . Back pain     arthritis  . Eczema   . Chronic constipation     takes Stool Softener  . History of MRSA infection     > 41yr ago    Her Past Surgical History Is Significant For: Past Surgical History  Procedure Laterality Date  . Gallbladder surgery  89yr[age]  . Cesarean section  [id number]  . Sinus endo w/fusion Bilateral 02/03/2013    Procedure: ENDOSCOPIC SINUS SURGERY WITH FUSION NAVIGATION;  Surgeon: MitRuby ColaD;  [facility]  Laterality: Bilateral;  Repaired CSF Leak    Her Family History Is Significant For: Family History  Problem Relation Age of Onset  . Cancer - Prostate Father   . Hypertension Father   .  Hypertension Mother   . Diabetes Mother   . Heart attack Neg Hx   . Hyperlipidemia Neg Hx   . Sudden death Neg Hx     Her Social History Is Significant For: Social History   Social History  . Marital Status: Married    Spouse Name: TonNicole Kindred Number of Children: 12  . Years of Education: 12+   Occupational History  .  [employer] [place]   Social History Main Topics  . Smoking status: Never Smoker   . Smokeless tobacco: Never Used  . Alcohol Use: No  . Drug Use: No  . Sexual Activity: Yes   Other Topics Concern  . None   Social History Narrative   Patient lives at home with husband and son. (ToNicole Kindred Patient has  2 children.    Patient is currently working as an Therapist, sports.   Patient has a college education.    Patient is right handed.    Consumes drinks 1 16oz soda 3 times a week, rarely drinks coffee.    Works night shift    Her Allergies Are:  Allergies  Allergen Reactions  . Codeine Nausea And Vomiting  . Hydrocodone Itching  :   Her Current Medications Are:  Outpatient Encounter Prescriptions as of 06/24/2015  Medication Sig  . acetaminophen (TYLENOL) 500 MG tablet Take 500 mg by mouth 2 (two) times daily as needed for pain. Tylenol with aspirin and caffeine.  Marland Kitchen acetaZOLAMIDE (DIAMOX) 500 MG capsule Take 1 capsule (500 mg total) by mouth 2 (two) times daily.  . benazepril-hydrochlorthiazide (LOTENSIN HCT) 20-25 MG per tablet Take 1 tablet by mouth daily.  . cetirizine (ZYRTEC) 10 MG tablet Take 10 mg by mouth daily.   . Cholecalciferol (VITAMIN D3) 50000 UNITS CAPS TAKE ONE CAPSULE ONCE A WEEK  . famotidine (PEPCID) 20 MG tablet Take 20 mg by mouth daily.  . ondansetron (ZOFRAN) 4 MG tablet Take 4 mg by mouth every 8 (eight) hours as needed for nausea or vomiting.  . potassium chloride SA (K-DUR,KLOR-CON) 20 MEQ tablet Take 20 mEq by mouth 2 (two) times daily.  . sennosides-docusate sodium (SENOKOT-S) 8.6-50 MG tablet Take 1 tablet by mouth daily.  Marland Kitchen topiramate (TOPAMAX) 25  MG tablet Take 1 tablet (25 mg total) by mouth daily. 1 po hs for 7 days then 2 hs   No facility-administered encounter medications on file as of 06/24/2015.  :  Review of Systems:  Out of a complete 14 point review of systems, all are reviewed and negative with the exception of these symptoms as listed below:   Review of Systems  Neurological:       Patient is here for f/u. No new concerns. Patient states that she is trying to get a new mask.     Objective:  Neurologic Exam  Physical Exam Physical Examination:   Filed Vitals:   06/24/15 1410  BP: 114/70  Pulse: 78  Resp: 16   General Examination: The patient is a very pleasant 49 y.o. female in no acute distress. She appears well-developed and well-nourished and well groomed. She is in good spirits today.  HEENT: Normocephalic, atraumatic, pupils are equal, round and reactive to light and accommodation. Extraocular tracking is good without limitation to gaze excursion or nystagmus noted. Normal smooth pursuit is noted. Hearing is grossly intact. Face is symmetric with normal facial animation and normal facial sensation. Speech is clear with no dysarthria noted. There is no hypophonia. There is no lip, neck/head, jaw or voice tremor. Neck is supple with full range of passive and active motion. There are no carotid bruits on auscultation. Oropharynx exam reveals: mild mouth dryness, good dental hygiene and moderate airway crowding, due to narrow airway entry and tonsils are 1+ bilaterally. She has mild pharyngeal erythema. Mallampati is class II. Tongue protrudes centrally and palate elevates symmetrically. She has a mild overbite.    Chest: Clear to auscultation without wheezing, rhonchi or crackles noted.  Heart: S1+S2+0, regular and normal without murmurs, rubs or gallops noted.   Abdomen: Soft, non-tender and non-distended with normal bowel sounds appreciated on auscultation.  Extremities: There is trace pitting edema in the  distal lower extremities bilaterally. Pedal pulses are intact.  Skin: Warm and dry without trophic changes noted. There are no varicose veins.  Musculoskeletal: exam reveals no obvious joint deformities, tenderness or joint swelling or erythema.   Neurologically:  Mental status: The patient is awake, alert and oriented in all 4 spheres. Her immediate and remote memory, attention, language skills and fund of knowledge are appropriate. There is no evidence of aphasia, agnosia, apraxia or anomia. Speech is clear with normal prosody and enunciation. Thought process is linear. Mood is normal and affect is normal.  Cranial nerves II - XII are as described above under HEENT exam. In addition: shoulder shrug is normal with equal shoulder height noted. Motor exam: Normal bulk, strength and tone is noted. There is no drift, tremor or rebound. Romberg is negative. Reflexes are 2+ throughout. Fine motor skills and coordination: intact. Heel to shin is unremarkable bilaterally. There is no truncal or gait ataxia.  Sensory exam: intact to light touch in the upper and lower extremities.  Gait, station and balance: She stands easily. No veering to one side is noted. No leaning to one side is noted. Posture is age-appropriate and stance is narrow based. Gait shows normal stride length and normal pace. No problems turning are noted. Tandem walk is unremarkable.                Assessment and Plan:   In summary, [name] is a very pleasant 49 year old female with an underlying medical history of allergies, morbid obesity, recurrent headaches, hypertension and reflux disease, who returns for follow-up consultation of her obstructive sleep apnea. She is on treatment with CPAP at 7 cm. She has had good compliance in the past and suboptimal compliance currently. She uses CPAP every night but does not always achieve 4 hours. Her night shift work contributes to this issue. She is contemplating to switching to day shift  and a year from now or so. She has noticed that it is difficult for her to maintain a schedule with working nights. She has a 21 year old child. She is trying to lose weight. She is commended for her endeavors. She is overall feeling better with CPAP therapy and has adjusted to it well. She had a baseline sleep study in January 2016, and had a CPAP titration study in March 2016 and we reviewed findings briefly. Overall, she has mild obstructive sleep apnea with a baseline AHI of 10.7, which rose to 38.5 per hour during REM sleep. She had some desaturations particularly in rem sleep. She is advised to try to be compliant fully with CPAP therapy. She is furthermore advised to try to strive for weight loss. At this juncture, she can follow-up with me in one year, sooner if needed. I advised her, that from the sleep standpoint there is no contraindication for her upcoming hysterectomy and bladder sling surgery. She is encouraged to bring her CPAP machine for her overnight stay post surgery. Thankfully, she does not have any severe obstructive sleep disordered breathing and she is reassured in that regard. Her physical exam is stable.  I answered all her questions today and the patient was in agreement.  I spent 20inutes in total face-to-face time with the patient, more than 50% of which was spent in counseling and coordination of care, reviewing test results, reviewing medication and discussing or reviewing the diagnosis of OSA, its prognosis and treatment options.

## 2015-06-24 NOTE — Patient Instructions (Signed)

## 2015-07-10 DIAGNOSIS — N3941 Urge incontinence: Secondary | ICD-10-CM | POA: Diagnosis not present

## 2015-07-10 DIAGNOSIS — R351 Nocturia: Secondary | ICD-10-CM | POA: Diagnosis not present

## 2015-07-10 DIAGNOSIS — N8111 Cystocele, midline: Secondary | ICD-10-CM | POA: Diagnosis not present

## 2015-07-16 DIAGNOSIS — N816 Rectocele: Secondary | ICD-10-CM | POA: Diagnosis not present

## 2015-07-16 DIAGNOSIS — N8111 Cystocele, midline: Secondary | ICD-10-CM | POA: Diagnosis not present

## 2015-07-18 MED FILL — BENAZEPRIL-HCTZ 20-25 MG TA: 20-25 | 90 days supply | Qty: 135 | Fill #0 | Status: TO

## 2015-07-31 DIAGNOSIS — G43809 Other migraine, not intractable, without status migrainosus: Secondary | ICD-10-CM | POA: Diagnosis not present

## 2015-07-31 DIAGNOSIS — E785 Hyperlipidemia, unspecified: Secondary | ICD-10-CM | POA: Diagnosis not present

## 2015-07-31 DIAGNOSIS — I10 Essential (primary) hypertension: Secondary | ICD-10-CM | POA: Diagnosis not present

## 2015-07-31 DIAGNOSIS — G932 Benign intracranial hypertension: Secondary | ICD-10-CM | POA: Diagnosis not present

## 2015-08-08 ENCOUNTER — Ambulatory Visit: Payer: 59 | Admitting: Nurse Practitioner

## 2015-08-15 ENCOUNTER — Other Ambulatory Visit: Payer: Self-pay | Admitting: Urology

## 2015-08-15 MED FILL — PHENAZOPYRIDINE 200 MG TAB: 200 | 1 days supply | Qty: 1 | Fill #0

## 2015-08-27 DIAGNOSIS — G4733 Obstructive sleep apnea (adult) (pediatric): Secondary | ICD-10-CM | POA: Diagnosis not present

## 2015-09-23 DIAGNOSIS — N814 Uterovaginal prolapse, unspecified: Secondary | ICD-10-CM | POA: Diagnosis not present

## 2015-09-23 DIAGNOSIS — R1031 Right lower quadrant pain: Secondary | ICD-10-CM | POA: Diagnosis not present

## 2015-09-23 DIAGNOSIS — B259 Cytomegaloviral disease, unspecified: Secondary | ICD-10-CM | POA: Diagnosis not present

## 2015-09-23 DIAGNOSIS — N3941 Urge incontinence: Secondary | ICD-10-CM | POA: Diagnosis not present

## 2015-09-26 ENCOUNTER — Other Ambulatory Visit: Payer: Self-pay | Admitting: Obstetrics and Gynecology

## 2015-10-01 NOTE — Patient Instructions (Addendum)
Your procedure is scheduled on:  Thursday, October 10, 2015  Enter through the Main Entrance of [facility] at:  6:00 AM  Pick up the phone at the desk and dial [phone].  Call this number if you have problems the morning of surgery: [phone].  Remember: Do NOT eat food or drink after:  Midnight Wednesday  Take these medicines the morning of surgery with a SIP OF WATER:  Diamox, Benazepril, Pepcid, Potassium  Do NOT wear jewelry (body piercing), metal hair clips/bobby pins, make-up, or nail polish. Do NOT wear lotions, powders, or perfumes.  You may wear deodorant. Do NOT shave for 48 hours prior to surgery. Do NOT bring valuables to the hospital. Contacts, dentures, or bridgework may not be worn into surgery.  Leave suitcase in car.  After surgery it may be brought to your room.  For patients admitted to the hospital, checkout time is 11:00 AM the day of discharge.

## 2015-10-02 ENCOUNTER — Encounter (HOSPITAL_COMMUNITY)
Admission: RE | Admit: 2015-10-02 | Discharge: 2015-10-02 | Disposition: A | Discharge status: Home / Self Care | Payer: 59 | Source: Ambulatory Visit | Attending: Obstetrics and Gynecology | Admitting: Obstetrics and Gynecology

## 2015-10-02 ENCOUNTER — Encounter (HOSPITAL_COMMUNITY): Payer: Self-pay

## 2015-10-02 DIAGNOSIS — Z01812 Encounter for preprocedural laboratory examination: Secondary | ICD-10-CM | POA: Diagnosis not present

## 2015-10-02 HISTORY — DX: Benign intracranial hypertension: G93.2

## 2015-10-02 HISTORY — DX: Sleep apnea, unspecified: G47.30

## 2015-10-02 HISTORY — DX: Unspecified osteoarthritis, unspecified site: M19.90

## 2015-10-02 HISTORY — DX: Dizziness and giddiness: R42

## 2015-10-02 LAB — CBC
HCT: 39 % (ref 36.0–46.0)
Hemoglobin: 12.8 g/dL (ref 12.0–15.0)
MCH: 27 pg (ref 26.0–34.0)
MCHC: 32.8 g/dL (ref 30.0–36.0)
MCV: 82.3 fL (ref 78.0–100.0)
PLATELETS: 269 10*3/uL (ref 150–400)
RBC: 4.74 MIL/uL (ref 3.87–5.11)
RDW: 14.2 % (ref 11.5–15.5)
WBC: 4.4 10*3/uL (ref 4.0–10.5)

## 2015-10-02 LAB — BASIC METABOLIC PANEL
Anion gap: 9 (ref 5–15)
BUN: 18 mg/dL (ref 6–20)
CHLORIDE: 104 mmol/L (ref 101–111)
CO2: 25 mmol/L (ref 22–32)
CREATININE: 0.67 mg/dL (ref 0.44–1.00)
Calcium: 8.8 mg/dL — ABNORMAL LOW (ref 8.9–10.3)
GFR calc Af Amer: >60 mL/min (ref >60)
GFR calc non Af Amer: >60 mL/min (ref >60)
Glucose, Bld: 102 mg/dL — ABNORMAL HIGH (ref 65–99)
Potassium: 3.7 mmol/L (ref 3.5–5.1)
SODIUM: 138 mmol/L (ref 135–145)

## 2015-10-02 LAB — PROTIME-INR
INR: 1.03 (ref 0.00–1.49)
Prothrombin Time: 13.7 seconds (ref 11.6–15.2)

## 2015-10-02 LAB — TYPE AND SCREEN
ABO/RH(D): B POS
Antibody Screen: NEGATIVE

## 2015-10-02 LAB — APTT: aPTT: 27 seconds (ref 24–37)

## 2015-10-02 LAB — ABO/RH: ABO/RH(D): B POS

## 2015-10-06 NOTE — Anesthesia Preprocedure Evaluation (Addendum)
Anesthesia Evaluation  Patient identified by MRN, date of birth, ID band Patient awake    Reviewed: Allergy & Precautions, H&P , NPO status , Patient's Chart, lab work & pertinent test results  Airway Mallampati: I  TM Distance: >3 FB Neck ROM: Full    Dental  (+) Dental Advisory Given, Teeth Intact   Pulmonary sleep apnea and Continuous Positive Airway Pressure Ventilation ,    breath sounds clear to auscultation       Cardiovascular hypertension, Pt. on medications  Rhythm:Regular  STRESS 2014 negative   Neuro/Psych  Headaches,    GI/Hepatic GERD  Medicated and Controlled,  Endo/Other  Morbid obesity  Renal/GU      Musculoskeletal   Abdominal   Peds  Hematology   Anesthesia Other Findings   Reproductive/Obstetrics                           Anesthesia Physical Anesthesia Plan  ASA: III  Anesthesia Plan: General   Post-op Pain Management:    Induction: Intravenous  Airway Management Planned: Oral ETT  Additional Equipment:   Intra-op Plan:   Post-operative Plan: Extubation in OR  Informed Consent: I have reviewed the patients History and Physical, chart, labs and discussed the procedure including the risks, benefits and alternatives for the proposed anesthesia with the patient or authorized representative who has indicated his/her understanding and acceptance.     Plan Discussed with:   Anesthesia Plan Comments: (Multimodal pain RX, Glide available)        Anesthesia Quick Evaluation

## 2015-10-07 ENCOUNTER — Telehealth: Payer: Self-pay | Admitting: Nurse Practitioner

## 2015-10-07 MED ORDER — TOPIRAMATE 25 MG PO TABS: 50.0000 mg | ORAL_TABLET | Freq: Every day | ORAL | Status: DC

## 2015-10-07 MED ORDER — ACETAZOLAMIDE ER 500 MG PO CP12: 500.0000 mg | ORAL_CAPSULE | Freq: Two times a day (BID) | ORAL | Status: DC

## 2015-10-07 NOTE — Telephone Encounter (Signed)
R/F's sent to [facility] as requested/fim

## 2015-10-07 NOTE — H&P (Signed)
History of Present Illness   I was consulted by Dr Garwin Brothers regarding [name]'s pelvic organ prolapse. 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 She has uncommon urge incontinence if she holds it too long. She denies stress incontinence. She wears 1 mini pad a day which is usually dry.   She works nights and voids every 2 to 3 hours or longer. She gets up once or twice while she is sleeping. She has a reasonable flow.     On her last visit, she had prolapse symptoms. She had a moderate grade 3 cystocele that reached the introitus so she had a large central defect. The cervix descended as noted to approximately 3 to 4 cm from the introitus. She had a distal grade 2 rectocele. She had a negative cough test. She has discussed a hysterectomy with Dr Garwin Brothers. She did have a past history of stress incontinence as noted on my last dictation. I thought she would best benefit from a transvaginal hysterectomy with vault suspension, cystocele repair and graft, and rectocele repair. Urodynamics were ordered to assess the outlet.   On urodynamics, she did not void and was catheterized for 50 mL. The maximum capacity was 195 mL. She had some low pressure instability reaching a pressure of 2 cmH2O not associated with leakage. She was feeling urgency throughout. She did not leak with a Valsalva pressure of 122 cmH2O with or without the prolapse reduced. During voluntary voiding, she voided 172 mL with a maximum flow of 14 mL/s. The maximum voiding pressure was 54 cmH2O. She emptied efficiently. EMG activity increased during the voiding phase. Details of urodynamics are signed and dictated on the urodynamic sheet.    Past Medical History Problems  1. History of gastric ulcer (Z87.19) 2. History of sleep apnea (Z86.69)  Surgical History Problems  1. History of Cholecystectomy 2. History of Gallbladder Surgery  Current Meds 1. Diamox TABS;  Therapy: (Recorded:30Dec2016) to Recorded 2. Fluticasone  Propionate 50 MCG/ACT Nasal Suspension;  Therapy: (Recorded:28Jan2008) to Recorded 3. Pepcid 20 MG Oral Tablet;  Therapy: (Recorded:30Dec2016) to Recorded 4. Triamterene-HCTZ 37.5-25 MG Oral Tablet;  Therapy: 21Oct2007 to Recorded 5. Zyrtec 10 MG TABS;  Therapy: (Recorded:28Jan2008) to Recorded  Allergies Medication  1. No Known Drug Allergies  Family History Problems  1. Family history of Family Health Status Number Of Children   2 sons 2. Family history of prostate cancer (Z80.42) : Father  Social History Problems  1. Caffeine use (F15.90) 2. Father deceased 3. Marital History - Currently Married 4. Mother deceased 55. Never a smoker 6. No alcohol use 7. Occupation 8. Occupation:   Theatre manager Assessed  1. Cystocele, midline (N81.11) 2. Rectocele, female (N81.6)  Discussion/Summary   I drew Laura Rempfer a picture. Pessary versus watchful waiting versus a transvaginal hysterectomy with cystocele repair and graft, a vault suspension, and rectocele repair was reviewed.   I drew her a picture and we talked about prolapse surgery in detail. Pros, cons, general surgical and anesthetic risks, and other options including behavioral therapy, pessaries, and watchful waiting were discussed. She understands that prolapse repairs are successful in 80-85% of cases for prolapse symptoms and can recur anteriorly, posteriorly, and/or apically. She understands that in most cases I use a graft and general risks were discussed. Surgical risks were described but not limited to the discussion of injury to neighboring structures including the bowel (with possible life-threatening sepsis and colostomy), bladder, urethra, vagina (all resulting in further surgery), and ureter (  resulting in re-implantation). We talked about injury to nerves/soft tissue leading to debilitating and intractable pelvic, abdominal, and lower extremity pain syndromes and neuropathies. The risks of buttock pain,  intractable dyspareunia, and vaginal narrowing and shortening with sequelae were discussed. Bleeding risks, transfusion rates, and infection were discussed. The risk of persistent, de novo, or worsening bladder and/or bowel incontinence/dysfunction was discussed. The need for CIC was described as well the usual post-operative course. The patient understands that she might not reach her treatment goal and that she might be worse following surgery.  Mesh issues discussed.   Worsening incontinence discussed.   Laura Fugere is sexually active. I think she wants to proceed with surgery. She is going to call Dr Garwin Brothers and we will do this at [facility].

## 2015-10-07 NOTE — Telephone Encounter (Signed)
Pt called said she still has [facility] and is wanting to use 90 day supply at [facility] (f) [phone], (p) [phone]. Please refill topiramate (TOPAMAX) 25 MG tablet and acetaZOLAMIDE (DIAMOX) 500 MG capsule

## 2015-10-09 MED ORDER — DEXTROSE 5 % IV SOLN
3.0000 g | INTRAVENOUS | Status: DC
  Filled 2015-10-09: qty 3000

## 2015-10-09 MED ORDER — PHENAZOPYRIDINE HCL 200 MG PO TABS
200.0000 mg | ORAL_TABLET | Freq: Once | ORAL | Status: DC
  Filled 2015-10-09: qty 1

## 2015-10-09 MED ORDER — PHENAZOPYRIDINE HCL 200 MG PO TABS
200.0000 mg | ORAL_TABLET | ORAL | Status: DC
  Filled 2015-10-09: qty 1

## 2015-10-10 ENCOUNTER — Encounter (HOSPITAL_COMMUNITY): Payer: Self-pay

## 2015-10-10 ENCOUNTER — Ambulatory Visit (HOSPITAL_COMMUNITY): Payer: 59 | Admitting: Anesthesiology

## 2015-10-10 ENCOUNTER — Encounter (HOSPITAL_COMMUNITY)
Admission: RE | Disposition: A | Discharge status: Home / Self Care | Payer: Self-pay | Source: Ambulatory Visit | Attending: Obstetrics and Gynecology

## 2015-10-10 ENCOUNTER — Ambulatory Visit (HOSPITAL_COMMUNITY)
Admission: RE | Admit: 2015-10-10 | Discharge: 2015-10-12 | Disposition: A | Discharge status: Home / Self Care | Payer: 59 | Source: Ambulatory Visit | Attending: Obstetrics and Gynecology | Admitting: Obstetrics and Gynecology

## 2015-10-10 DIAGNOSIS — Z6841 Body Mass Index (BMI) 40.0 and over, adult: Secondary | ICD-10-CM | POA: Diagnosis not present

## 2015-10-10 DIAGNOSIS — N814 Uterovaginal prolapse, unspecified: Secondary | ICD-10-CM | POA: Insufficient documentation

## 2015-10-10 DIAGNOSIS — N888 Other specified noninflammatory disorders of cervix uteri: Secondary | ICD-10-CM | POA: Diagnosis not present

## 2015-10-10 DIAGNOSIS — Z885 Allergy status to narcotic agent status: Secondary | ICD-10-CM | POA: Diagnosis not present

## 2015-10-10 DIAGNOSIS — E669 Obesity, unspecified: Secondary | ICD-10-CM

## 2015-10-10 DIAGNOSIS — I10 Essential (primary) hypertension: Secondary | ICD-10-CM | POA: Diagnosis not present

## 2015-10-10 DIAGNOSIS — D259 Leiomyoma of uterus, unspecified: Secondary | ICD-10-CM | POA: Diagnosis not present

## 2015-10-10 DIAGNOSIS — D251 Intramural leiomyoma of uterus: Secondary | ICD-10-CM | POA: Diagnosis not present

## 2015-10-10 DIAGNOSIS — G4730 Sleep apnea, unspecified: Secondary | ICD-10-CM | POA: Diagnosis not present

## 2015-10-10 DIAGNOSIS — N8189 Other female genital prolapse: Secondary | ICD-10-CM | POA: Diagnosis not present

## 2015-10-10 DIAGNOSIS — Z8719 Personal history of other diseases of the digestive system: Secondary | ICD-10-CM | POA: Insufficient documentation

## 2015-10-10 DIAGNOSIS — IMO0002 Reserved for concepts with insufficient information to code with codable children: Secondary | ICD-10-CM | POA: Diagnosis present

## 2015-10-10 DIAGNOSIS — M1990 Unspecified osteoarthritis, unspecified site: Secondary | ICD-10-CM | POA: Diagnosis not present

## 2015-10-10 DIAGNOSIS — K219 Gastro-esophageal reflux disease without esophagitis: Secondary | ICD-10-CM | POA: Insufficient documentation

## 2015-10-10 DIAGNOSIS — N812 Incomplete uterovaginal prolapse: Secondary | ICD-10-CM | POA: Diagnosis present

## 2015-10-10 DIAGNOSIS — N3946 Mixed incontinence: Secondary | ICD-10-CM | POA: Diagnosis not present

## 2015-10-10 DIAGNOSIS — N816 Rectocele: Secondary | ICD-10-CM | POA: Diagnosis present

## 2015-10-10 DIAGNOSIS — Z90710 Acquired absence of both cervix and uterus: Secondary | ICD-10-CM | POA: Diagnosis present

## 2015-10-10 DIAGNOSIS — N393 Stress incontinence (female) (male): Secondary | ICD-10-CM | POA: Diagnosis not present

## 2015-10-10 DIAGNOSIS — N800 Endometriosis of uterus: Secondary | ICD-10-CM | POA: Diagnosis not present

## 2015-10-10 DIAGNOSIS — N8111 Cystocele, midline: Secondary | ICD-10-CM | POA: Diagnosis not present

## 2015-10-10 HISTORY — PX: VAGINAL HYSTERECTOMY: SHX2639

## 2015-10-10 HISTORY — PX: CYSTO: SHX6284

## 2015-10-10 HISTORY — PX: ANTERIOR AND POSTERIOR REPAIR: SHX5121

## 2015-10-10 HISTORY — PX: VAGINAL PROLAPSE REPAIR: SHX830

## 2015-10-10 SURGERY — HYSTERECTOMY, VAGINAL
Anesthesia: General | Site: Vagina

## 2015-10-10 MED ORDER — OXYCODONE-ACETAMINOPHEN 5-325 MG PO TABS
1.0000 | ORAL_TABLET | ORAL | Status: AC | PRN
  Administered 2015-10-10: 1 via ORAL
  Filled 2015-10-10: qty 1

## 2015-10-10 MED ORDER — SUGAMMADEX SODIUM 200 MG/2ML IV SOLN
INTRAVENOUS | Status: DC | PRN
  Administered 2015-10-10: 200 mg via INTRAVENOUS

## 2015-10-10 MED ORDER — ONDANSETRON HCL 4 MG/2ML IJ SOLN: 4.0000 mg | Freq: Four times a day (QID) | INTRAMUSCULAR | Status: DC | PRN

## 2015-10-10 MED ORDER — BENAZEPRIL HCL 10 MG PO TABS
10.0000 mg | ORAL_TABLET | Freq: Every day | ORAL | Status: DC
  Administered 2015-10-10 – 2015-10-11 (×2): 10 mg via ORAL
  Filled 2015-10-10 (×3): qty 1

## 2015-10-10 MED ORDER — SODIUM CHLORIDE 0.9 % IR SOLN
Status: DC | PRN
  Administered 2015-10-10: 500 mL

## 2015-10-10 MED ORDER — FENTANYL CITRATE (PF) 100 MCG/2ML IJ SOLN
INTRAMUSCULAR | Status: DC | PRN
  Administered 2015-10-10: 50 ug via INTRAVENOUS
  Administered 2015-10-10: 25 ug via INTRAVENOUS
  Administered 2015-10-10: 100 ug via INTRAVENOUS
  Administered 2015-10-10: 50 ug via INTRAVENOUS
  Administered 2015-10-10: 100 ug via INTRAVENOUS
  Administered 2015-10-10: 25 ug via INTRAVENOUS
  Administered 2015-10-10: 50 ug via INTRAVENOUS

## 2015-10-10 MED ORDER — MIDAZOLAM HCL 2 MG/2ML IJ SOLN
INTRAMUSCULAR | Status: DC | PRN
  Administered 2015-10-10: 2 mg via INTRAVENOUS

## 2015-10-10 MED ORDER — MIDAZOLAM HCL 2 MG/2ML IJ SOLN
INTRAMUSCULAR | Status: AC
  Filled 2015-10-10: qty 2

## 2015-10-10 MED ORDER — DEXAMETHASONE SODIUM PHOSPHATE 4 MG/ML IJ SOLN
INTRAMUSCULAR | Status: AC
  Filled 2015-10-10: qty 1

## 2015-10-10 MED ORDER — HYDROMORPHONE HCL 1 MG/ML IJ SOLN
0.2000 mg | INTRAMUSCULAR | Status: DC | PRN
  Administered 2015-10-10: 0.6 mg via INTRAVENOUS

## 2015-10-10 MED ORDER — SIMETHICONE 80 MG PO CHEW: 80.0000 mg | CHEWABLE_TABLET | Freq: Four times a day (QID) | ORAL | Status: DC | PRN

## 2015-10-10 MED ORDER — ROCURONIUM BROMIDE 100 MG/10ML IV SOLN
INTRAVENOUS | Status: AC
  Filled 2015-10-10: qty 1

## 2015-10-10 MED ORDER — SODIUM CHLORIDE 0.9 % IJ SOLN
INTRAMUSCULAR | Status: AC
  Filled 2015-10-10: qty 10

## 2015-10-10 MED ORDER — ACETAZOLAMIDE 250 MG PO TABS
500.0000 mg | ORAL_TABLET | Freq: Two times a day (BID) | ORAL | Status: DC
  Administered 2015-10-10 – 2015-10-12 (×4): 500 mg via ORAL
  Filled 2015-10-10 (×6): qty 2

## 2015-10-10 MED ORDER — LIDOCAINE HCL (CARDIAC) 20 MG/ML IV SOLN
INTRAVENOUS | Status: DC | PRN
  Administered 2015-10-10: 100 mg via INTRAVENOUS

## 2015-10-10 MED ORDER — HYDROCHLOROTHIAZIDE 12.5 MG PO CAPS
12.5000 mg | ORAL_CAPSULE | Freq: Every day | ORAL | Status: DC
  Administered 2015-10-10 – 2015-10-11 (×2): 12.5 mg via ORAL
  Filled 2015-10-10 (×3): qty 1

## 2015-10-10 MED ORDER — EPHEDRINE SULFATE 50 MG/ML IJ SOLN
INTRAMUSCULAR | Status: DC | PRN
  Administered 2015-10-10: 5 mg via INTRAVENOUS

## 2015-10-10 MED ORDER — ESTRADIOL 0.1 MG/GM VA CREA
TOPICAL_CREAM | VAGINAL | Status: AC
  Filled 2015-10-10: qty 42.5

## 2015-10-10 MED ORDER — MENTHOL 3 MG MT LOZG: 1.0000 | LOZENGE | OROMUCOSAL | Status: DC | PRN

## 2015-10-10 MED ORDER — DEXAMETHASONE SODIUM PHOSPHATE 10 MG/ML IJ SOLN
INTRAMUSCULAR | Status: AC
  Filled 2015-10-10: qty 1

## 2015-10-10 MED ORDER — FLEET ENEMA 7-19 GM/118ML RE ENEM: 1.0000 | ENEMA | Freq: Once | RECTAL | Status: DC

## 2015-10-10 MED ORDER — METHYLENE BLUE 1 % INJ SOLN
INTRAMUSCULAR | Status: AC
  Filled 2015-10-10: qty 1

## 2015-10-10 MED ORDER — ONDANSETRON HCL 4 MG PO TABS: 4.0000 mg | ORAL_TABLET | Freq: Four times a day (QID) | ORAL | Status: DC | PRN

## 2015-10-10 MED ORDER — PROPOFOL 10 MG/ML IV BOLUS
INTRAVENOUS | Status: AC
  Filled 2015-10-10: qty 20

## 2015-10-10 MED ORDER — FENTANYL CITRATE (PF) 100 MCG/2ML IJ SOLN
INTRAMUSCULAR | Status: AC
  Administered 2015-10-10: 50 ug via INTRAVENOUS
  Filled 2015-10-10: qty 2

## 2015-10-10 MED ORDER — EPHEDRINE 5 MG/ML INJ
INTRAVENOUS | Status: AC
  Filled 2015-10-10: qty 10

## 2015-10-10 MED ORDER — DEXAMETHASONE SODIUM PHOSPHATE 10 MG/ML IJ SOLN
INTRAMUSCULAR | Status: DC | PRN
  Administered 2015-10-10: 4 mg via INTRAVENOUS

## 2015-10-10 MED ORDER — FENTANYL CITRATE (PF) 250 MCG/5ML IJ SOLN
INTRAMUSCULAR | Status: AC
  Filled 2015-10-10: qty 5

## 2015-10-10 MED ORDER — PANTOPRAZOLE SODIUM 40 MG PO TBEC
40.0000 mg | DELAYED_RELEASE_TABLET | Freq: Every day | ORAL | Status: DC
  Administered 2015-10-11 – 2015-10-12 (×2): 40 mg via ORAL
  Filled 2015-10-10 (×2): qty 1

## 2015-10-10 MED ORDER — ONDANSETRON HCL 4 MG/2ML IJ SOLN
INTRAMUSCULAR | Status: AC
  Filled 2015-10-10: qty 2

## 2015-10-10 MED ORDER — SODIUM CHLORIDE 0.9 % IR SOLN
Freq: Once | Status: AC
  Administered 2015-10-10: 500 mL
  Filled 2015-10-10: qty 1

## 2015-10-10 MED ORDER — CEFAZOLIN SODIUM-DEXTROSE 2-4 GM/100ML-% IV SOLN
2.0000 g | Freq: Once | INTRAVENOUS | Status: AC
  Administered 2015-10-10: 2 g via INTRAVENOUS
  Filled 2015-10-10: qty 100

## 2015-10-10 MED ORDER — FENTANYL CITRATE (PF) 100 MCG/2ML IJ SOLN
25.0000 ug | INTRAMUSCULAR | Status: DC | PRN
  Administered 2015-10-10 (×3): 50 ug via INTRAVENOUS

## 2015-10-10 MED ORDER — LIDOCAINE-EPINEPHRINE (PF) 1 %-1:200000 IJ SOLN
INTRAMUSCULAR | Status: AC
  Filled 2015-10-10: qty 60

## 2015-10-10 MED ORDER — SCOPOLAMINE 1 MG/3DAYS TD PT72
1.0000 | MEDICATED_PATCH | Freq: Once | TRANSDERMAL | Status: DC
  Administered 2015-10-10: 1.5 mg via TRANSDERMAL

## 2015-10-10 MED ORDER — CEFAZOLIN SODIUM-DEXTROSE 2-3 GM-% IV SOLR
INTRAVENOUS | Status: AC
  Filled 2015-10-10: qty 50

## 2015-10-10 MED ORDER — HYDROCHLOROTHIAZIDE 25 MG PO TABS
25.0000 mg | ORAL_TABLET | Freq: Every day | ORAL | Status: DC
  Administered 2015-10-11 – 2015-10-12 (×2): 25 mg via ORAL
  Filled 2015-10-10 (×2): qty 1

## 2015-10-10 MED ORDER — ROCURONIUM BROMIDE 100 MG/10ML IV SOLN
INTRAVENOUS | Status: DC | PRN
  Administered 2015-10-10 (×2): 5 mg via INTRAVENOUS
  Administered 2015-10-10: 40 mg via INTRAVENOUS
  Administered 2015-10-10 (×2): 10 mg via INTRAVENOUS
  Administered 2015-10-10: 5 mg via INTRAVENOUS
  Administered 2015-10-10: 15 mg via INTRAVENOUS
  Administered 2015-10-10: 5 mg via INTRAVENOUS
  Administered 2015-10-10: 15 mg via INTRAVENOUS

## 2015-10-10 MED ORDER — SUGAMMADEX SODIUM 200 MG/2ML IV SOLN
INTRAVENOUS | Status: AC
  Filled 2015-10-10: qty 2

## 2015-10-10 MED ORDER — DEXTROSE IN LACTATED RINGERS 5 % IV SOLN
INTRAVENOUS | Status: DC
  Administered 2015-10-10: 21:00:00 via INTRAVENOUS

## 2015-10-10 MED ORDER — BENAZEPRIL HCL 20 MG PO TABS
20.0000 mg | ORAL_TABLET | Freq: Every day | ORAL | Status: DC
  Administered 2015-10-11 – 2015-10-12 (×2): 20 mg via ORAL
  Filled 2015-10-10 (×3): qty 1

## 2015-10-10 MED ORDER — SCOPOLAMINE 1 MG/3DAYS TD PT72
MEDICATED_PATCH | TRANSDERMAL | Status: AC
  Administered 2015-10-10: 1.5 mg via TRANSDERMAL
  Filled 2015-10-10: qty 1

## 2015-10-10 MED ORDER — ESTRADIOL 0.1 MG/GM VA CREA
TOPICAL_CREAM | VAGINAL | Status: DC | PRN
  Administered 2015-10-10: 2 via VAGINAL

## 2015-10-10 MED ORDER — BENAZEPRIL HCL 5 MG PO TABS
12.5000 mg | ORAL_TABLET | Freq: Every day | ORAL | Status: DC
  Filled 2015-10-10: qty 1

## 2015-10-10 MED ORDER — LIDOCAINE HCL (CARDIAC) 20 MG/ML IV SOLN
INTRAVENOUS | Status: AC
Start: 2015-10-10 — End: 2015-10-10
  Filled 2015-10-10: qty 5

## 2015-10-10 MED ORDER — PROPOFOL 10 MG/ML IV BOLUS
INTRAVENOUS | Status: DC | PRN
  Administered 2015-10-10: 200 mg via INTRAVENOUS

## 2015-10-10 MED ORDER — MEPERIDINE HCL 25 MG/ML IJ SOLN
6.2500 mg | INTRAMUSCULAR | Status: DC | PRN
  Administered 2015-10-10: 12.5 mg via INTRAVENOUS

## 2015-10-10 MED ORDER — OXYCODONE-ACETAMINOPHEN 5-325 MG PO TABS
1.0000 | ORAL_TABLET | ORAL | Status: DC | PRN
  Administered 2015-10-10 (×2): 1 via ORAL
  Administered 2015-10-11: 2 via ORAL
  Administered 2015-10-11: 1 via ORAL
  Administered 2015-10-11 (×2): 2 via ORAL
  Administered 2015-10-11: 1 via ORAL
  Administered 2015-10-12 (×3): 2 via ORAL
  Filled 2015-10-10 (×2): qty 1
  Filled 2015-10-10 (×2): qty 2
  Filled 2015-10-10: qty 1
  Filled 2015-10-10 (×2): qty 2
  Filled 2015-10-10: qty 1
  Filled 2015-10-10 (×3): qty 2

## 2015-10-10 MED ORDER — LIDOCAINE-EPINEPHRINE (PF) 1 %-1:200000 IJ SOLN
INTRAMUSCULAR | Status: AC
  Filled 2015-10-10: qty 30

## 2015-10-10 MED ORDER — LACTATED RINGERS IV SOLN
INTRAVENOUS | Status: DC
  Administered 2015-10-10 (×3): via INTRAVENOUS

## 2015-10-10 MED ORDER — IBUPROFEN 800 MG PO TABS
800.0000 mg | ORAL_TABLET | Freq: Three times a day (TID) | ORAL | Status: DC | PRN
  Administered 2015-10-12: 800 mg via ORAL
  Filled 2015-10-10: qty 1

## 2015-10-10 MED ORDER — HYDROMORPHONE HCL 1 MG/ML IJ SOLN
INTRAMUSCULAR | Status: AC
  Filled 2015-10-10: qty 1

## 2015-10-10 MED ORDER — STERILE WATER FOR IRRIGATION IR SOLN
Status: DC | PRN
  Administered 2015-10-10 (×2): 1000 mL

## 2015-10-10 MED ORDER — FENTANYL CITRATE (PF) 100 MCG/2ML IJ SOLN
INTRAMUSCULAR | Status: AC
  Filled 2015-10-10: qty 2

## 2015-10-10 MED ORDER — MEPERIDINE HCL 25 MG/ML IJ SOLN
INTRAMUSCULAR | Status: AC
  Filled 2015-10-10: qty 1

## 2015-10-10 MED ORDER — ONDANSETRON HCL 4 MG/2ML IJ SOLN
INTRAMUSCULAR | Status: DC | PRN
  Administered 2015-10-10: 4 mg via INTRAVENOUS

## 2015-10-10 MED ORDER — DEXTROSE 5 % IV SOLN
5.0000 mg/kg | INTRAVENOUS | Status: AC
  Administered 2015-10-10: 400 mg via INTRAVENOUS
  Filled 2015-10-10: qty 10

## 2015-10-10 MED ORDER — ONDANSETRON HCL 4 MG/2ML IJ SOLN
4.0000 mg | Freq: Four times a day (QID) | INTRAMUSCULAR | Status: DC
  Administered 2015-10-10: 4 mg via INTRAVENOUS

## 2015-10-10 MED ORDER — LIDOCAINE-EPINEPHRINE (PF) 1 %-1:200000 IJ SOLN
INTRAMUSCULAR | Status: DC | PRN
  Administered 2015-10-10: 18 mL
  Administered 2015-10-10 (×2): 20 mL

## 2015-10-10 MED ORDER — ACETAZOLAMIDE ER 500 MG PO CP12
500.0000 mg | ORAL_CAPSULE | Freq: Two times a day (BID) | ORAL | Status: DC
  Filled 2015-10-10 (×2): qty 1

## 2015-10-10 MED ORDER — BENAZEPRIL HCL 20 MG PO TABS
20.0000 mg | ORAL_TABLET | Freq: Every morning | ORAL | Status: DC
  Filled 2015-10-10: qty 1

## 2015-10-10 SURGICAL SUPPLY — 89 items
ALLOGRAFT TUTOPLAST AXIS 6X12 (Tissue) ×4 IMPLANT
APPLICATOR COTTON TIP 6IN STRL (MISCELLANEOUS) IMPLANT
BAG URINE DRAINAGE (UROLOGICAL SUPPLIES) ×6 IMPLANT
BLADE SURG 15 STRL LF C SS BP (BLADE) ×4 IMPLANT
BLADE SURG 15 STRL SS (BLADE) ×2
CABLE HIGH FREQUENCY MONO STRZ (ELECTRODE) IMPLANT
CANISTER SUCT 3000ML (MISCELLANEOUS) ×12 IMPLANT
CATH FOLEY 2WAY SLVR  5CC 16FR (CATHETERS) ×2
CATH FOLEY 2WAY SLVR 5CC 16FR (CATHETERS) ×4 IMPLANT
CATH ROBINSON RED A/P 16FR (CATHETERS) IMPLANT
CLOTH BEACON ORANGE TIMEOUT ST (SAFETY) ×6 IMPLANT
CONT PATH 16OZ SNAP LID 3702 (MISCELLANEOUS) ×6 IMPLANT
CONTAINER PREFILL 10% NBF 60ML (FORM) IMPLANT
COVER BACK TABLE 60X90IN (DRAPES) ×6 IMPLANT
DECANTER SPIKE VIAL GLASS SM (MISCELLANEOUS) ×12 IMPLANT
DEVICE CAPIO SLIM SINGLE (INSTRUMENTS) ×6 IMPLANT
DRAIN PENROSE 1/4X12 LTX (DRAIN) ×6 IMPLANT
DRAPE STERI URO 9X17 APER PCH (DRAPES) ×6 IMPLANT
DRAPE UNDERBUTTOCKS STRL (DRAPE) IMPLANT
DRSG COVADERM PLUS 2X2 (GAUZE/BANDAGES/DRESSINGS) IMPLANT
DRSG OPSITE POSTOP 3X4 (GAUZE/BANDAGES/DRESSINGS) IMPLANT
DURAPREP 26ML APPLICATOR (WOUND CARE) IMPLANT
ELECT LIGASURE LONG (ELECTRODE) ×6 IMPLANT
ELECT LIGASURE SHORT 9 REUSE (ELECTRODE) IMPLANT
ELECT REM PT RETURN 9FT ADLT (ELECTROSURGICAL) ×6
ELECTRODE REM PT RTRN 9FT ADLT (ELECTROSURGICAL) ×4 IMPLANT
FORCEPS CUTTING 33CM 5MM (CUTTING FORCEPS) IMPLANT
GAUZE PACKING 1 X5 YD ST (GAUZE/BANDAGES/DRESSINGS) IMPLANT
GAUZE PACKING 2X5 YD STRL (GAUZE/BANDAGES/DRESSINGS) ×18 IMPLANT
GAUZE SPONGE 4X4 16PLY XRAY LF (GAUZE/BANDAGES/DRESSINGS) ×30 IMPLANT
GLOVE BIO SURGEON STRL SZ7.5 (GLOVE) ×6 IMPLANT
GLOVE BIO SURGEON STRL SZ8 (GLOVE) ×12 IMPLANT
GLOVE BIOGEL PI IND STRL 6.5 (GLOVE) ×4 IMPLANT
GLOVE BIOGEL PI IND STRL 7.0 (GLOVE) ×20 IMPLANT
GLOVE BIOGEL PI INDICATOR 6.5 (GLOVE) ×2
GLOVE BIOGEL PI INDICATOR 7.0 (GLOVE) ×10
GLOVE ECLIPSE 6.5 STRL STRAW (GLOVE) ×6 IMPLANT
GOWN STRL REUS W/TWL LRG LVL3 (GOWN DISPOSABLE) ×48 IMPLANT
LEGGING LITHOTOMY PAIR STRL (DRAPES) ×6 IMPLANT
LIQUID BAND (GAUZE/BANDAGES/DRESSINGS) IMPLANT
NEEDLE HYPO 22GX1.5 SAFETY (NEEDLE) ×6 IMPLANT
NEEDLE INSUFFLATION 120MM (ENDOMECHANICALS) IMPLANT
NEEDLE MAYO 6 CRC TAPER PT (NEEDLE) ×6 IMPLANT
NEEDLE SPNL 22GX3.5 QUINCKE BK (NEEDLE) ×6 IMPLANT
NS IRRIG 1000ML POUR BTL (IV SOLUTION) ×12 IMPLANT
PACK LAVH (CUSTOM PROCEDURE TRAY) ×6 IMPLANT
PACK ROBOTIC GOWN (GOWN DISPOSABLE) ×6 IMPLANT
PACK VAGINAL WOMENS (CUSTOM PROCEDURE TRAY) IMPLANT
PAD OB MATERNITY 4.3X12.25 (PERSONAL CARE ITEMS) ×6 IMPLANT
PAD TRENDELENBURG POSITION (MISCELLANEOUS) ×6 IMPLANT
PENCIL BUTTON HOLSTER BLD 10FT (ELECTRODE) IMPLANT
PLUG CATH AND CAP STER (CATHETERS) ×6 IMPLANT
RETRACTOR STAY HOOK 5MM (MISCELLANEOUS) ×12 IMPLANT
SCISSORS LAP 5X35 DISP (ENDOMECHANICALS) IMPLANT
SET CYSTO W/LG BORE CLAMP LF (SET/KITS/TRAYS/PACK) ×6 IMPLANT
SET IRRIG TUBING LAPAROSCOPIC (IRRIGATION / IRRIGATOR) IMPLANT
SHEET LAVH (DRAPES) IMPLANT
SLEEVE XCEL OPT CAN 5 100 (ENDOMECHANICALS) ×6 IMPLANT
SOLUTION ELECTROLUBE (MISCELLANEOUS) IMPLANT
SUT CAPIO ETHIBPND (SUTURE) ×12 IMPLANT
SUT SILK 2 0 FS (SUTURE) IMPLANT
SUT VIC AB 0 CT1 18XCR BRD8 (SUTURE) ×8 IMPLANT
SUT VIC AB 0 CT1 27 (SUTURE) ×10
SUT VIC AB 0 CT1 27XBRD ANBCTR (SUTURE) ×8 IMPLANT
SUT VIC AB 0 CT1 27XCR 8 STRN (SUTURE) ×12 IMPLANT
SUT VIC AB 0 CT1 36 (SUTURE) ×18 IMPLANT
SUT VIC AB 0 CT1 8-18 (SUTURE) ×4
SUT VIC AB 0 CT2 27 (SUTURE) IMPLANT
SUT VIC AB 2-0 CT1 (SUTURE) ×12 IMPLANT
SUT VIC AB 2-0 SH 27 (SUTURE) ×10
SUT VIC AB 2-0 SH 27XBRD (SUTURE) ×20 IMPLANT
SUT VIC AB 3-0 CT1 27 (SUTURE) ×2
SUT VIC AB 3-0 CT1 TAPERPNT 27 (SUTURE) ×4 IMPLANT
SUT VIC AB 3-0 PS2 18 (SUTURE) ×2
SUT VIC AB 3-0 PS2 18XBRD (SUTURE) ×4 IMPLANT
SUT VIC AB 3-0 SH 27 (SUTURE) ×10
SUT VIC AB 3-0 SH 27X BRD (SUTURE) ×20 IMPLANT
SUT VICRYL 0 TIES 12 18 (SUTURE) ×6 IMPLANT
SUT VICRYL 0 UR6 27IN ABS (SUTURE) ×12 IMPLANT
SUT VICRYL 4-0 PS2 18IN ABS (SUTURE) IMPLANT
TOWEL OR 17X24 6PK STRL BLUE (TOWEL DISPOSABLE) ×24 IMPLANT
TRAY FOLEY CATH SILVER 14FR (SET/KITS/TRAYS/PACK) ×6 IMPLANT
TROCAR OPTI TIP 5M 100M (ENDOMECHANICALS) IMPLANT
TROCAR XCEL DIL TIP R 11M (ENDOMECHANICALS) IMPLANT
TUBING NON-CON 1/4 X 20 CONN (TUBING) ×5 IMPLANT
TUBING NON-CON 1/4 X 20' CONN (TUBING) ×1
TUTOPLAST AXIS 6X12 (Tissue) ×6 IMPLANT
WARMER LAPAROSCOPE (MISCELLANEOUS) IMPLANT
WATER STERILE IRR 1000ML POUR (IV SOLUTION) IMPLANT

## 2015-10-10 NOTE — Brief Op Note (Signed)
10/10/2015  1:41 PM  PATIENT:  [name]  50 y.o. female  PRE-OPERATIVE DIAGNOSIS:  Uterovaginal Prolapse, cystocele, rectocele,   POST-OPERATIVE DIAGNOSIS:   uterovaginal prolapse, cystocele, rectocele  PROCEDURE:  Total vaginal hysterectomy, mc call cul de plasty   SURGEON:  Surgeon(s) and Role: Panel 1:    * Servando Salina, MD - Primary  Panel 2:    * Bjorn Loser, MD - Primary  PHYSICIAN ASSISTANT:   ASSISTANTS: Dr Bjorn Loser   ANESTHESIA:   general Findings: nl left ovary, palp nl right ov, nl uterus, prior tubal separation, grade 3 cystocele, large rectocele EBL:  Total I/O In: 2000 [I.V.:2000] Out: 790 [Urine:460; Blood:330]  BLOOD ADMINISTERED:none  DRAINS: none   LOCAL MEDICATIONS USED:  LIDOCAINE   SPECIMEN:  Source of Specimen:  uterus with cervix  DISPOSITION OF SPECIMEN:  PATHOLOGY  COUNTS:  YES  TOURNIQUET:  * No tourniquets in log *  DICTATION: .Other Dictation: Dictation Number [id number]  PLAN OF CARE: Admit for overnight observation  PATIENT DISPOSITION:  PACU - hemodynamically stable.   Delay start of Pharmacological VTE agent (>24hrs) due to surgical blood loss or risk of bleeding: no

## 2015-10-10 NOTE — Progress Notes (Signed)
Consulted with patient about home CPAP use for tonight. Patient has own personal CPAP unit with her. I asked patient if she had any questions or concerns about using her personal unit here and she replied that she was very comfortable with using it. Patient also said that her husband would assist in placing it on. Told patient to alert RN if she needed help and I would respond accordingly.

## 2015-10-10 NOTE — Anesthesia Postprocedure Evaluation (Signed)
Anesthesia Post Note  Patient: [name]  Procedure(s) Performed: Procedure(s) (LRB): HYSTERECTOMY VAGINAL (N/A) ANTERIOR (CYSTOCELE) AND POSTERIOR REPAIR (RECTOCELE) (N/A) VAULT PROLAPSE AND GRAFT (N/A) CYSTO (N/A)  Patient location during evaluation: PACU Anesthesia Type: General Level of consciousness: awake and alert Pain management: pain level controlled Vital Signs Assessment: post-procedure vital signs reviewed and stable Respiratory status: spontaneous breathing, nonlabored ventilation, respiratory function stable and patient connected to nasal cannula oxygen Cardiovascular status: blood pressure returned to baseline and stable Postop Assessment: no signs of nausea or vomiting Anesthetic complications: no    Last Vitals:  Filed Vitals:   10/10/15 1247 10/10/15 1300  BP: 128/70 143/76  Pulse: 91 87  Temp: 37.2 C   Resp: 30 14    Last Pain:  Filed Vitals:   10/10/15 1304  PainSc: Asleep                 Alexis Frock

## 2015-10-10 NOTE — Anesthesia Procedure Notes (Signed)
Procedure Name: Intubation Date/Time: 10/10/2015 7:32 AM Performed by: Casimer Lanius A Pre-anesthesia Checklist: Patient identified, Emergency Drugs available, Suction available and Patient being monitored Patient Re-evaluated:Patient Re-evaluated prior to inductionOxygen Delivery Method: Circle system utilized and Simple face mask Preoxygenation: Pre-oxygenation with 100% oxygen Intubation Type: IV induction and Inhalational induction Ventilation: Mask ventilation without difficulty Laryngoscope Size: Mac and 3 Grade View: Grade II Tube type: Oral Tube size: 7.0 mm Number of attempts: 1 (by Manny MD) Intubation method: in snifffing position. Placement Confirmation: ETT inserted through vocal cords under direct vision,  positive ETCO2 and breath sounds checked- equal and bilateral Secured at: 20 (right lip) cm Tube secured with: Tape Dental Injury: Teeth and Oropharynx as per pre-operative assessment

## 2015-10-10 NOTE — Transfer of Care (Signed)
Immediate Anesthesia Transfer of Care Note  Patient: [name]  Procedure(s) Performed: Procedure(s): HYSTERECTOMY VAGINAL (N/A) ANTERIOR (CYSTOCELE) AND POSTERIOR REPAIR (RECTOCELE) (N/A) VAULT PROLAPSE AND GRAFT (N/A) CYSTO (N/A)  Patient Location: PACU  Anesthesia Type:General  Level of Consciousness: awake  Airway & Oxygen Therapy: Patient Spontanous Breathing  Post-op Assessment: Report given to PACU RN  Post vital signs: stable  Filed Vitals:   10/10/15 0633  BP: 120/68  Pulse: 85  Temp: 36.6 C  Resp: 20    Complications: No apparent anesthesia complications

## 2015-10-10 NOTE — Op Note (Signed)
Preoperative diagnosis: Cystocele, vault prolapse, rectocele Postoperative diagnosis: Cystocele, vault prolapse, rectocele Surgery: Cystocele repair and graft and vault suspension and rectocele repair and cystoscopy Surgeon: Dr. Nicki Reaper Desmund Elman Assistant: Dr. Catalina Gravel cousins  The patient has the above diagnoses and consented to the above procedure. Dr. cousins did a transvaginal hysterectomy and ran the cuff posteriorly. The ureteral sacral ligaments were very reasonable and tagged.  She had a short anterior vaginal wall and a significant trapdoor deformity anteriorly. There was not a lot of central defect. The dome of her bladder was trying to exit the distal third of the vagina or middle of the vagina.  After the hysterectomy she had cystoscopy and there is no bladder injury with excellent efflux bilaterally  With my Allis technique I made an anterior vaginal wall incision and sharply dissected the overlying vaginal wall mucosa from the underlying pubocervical fascia to the white line bilaterally. I was happy with my apical mobilization. Again she did not have a lot of length and the tissue was very weak. I did an anterior repair with running 2-0 Vicryl 2 layers not imbricating the bladder neck and was very pleased with the repair.  With appropriate landmarks I finger dissected to the ischial spine bilaterally mobilizing all the tissue medially. With a Capio device I placed a 0 Ethibond 1 full finger breath medial to the spine in a straight line between the spines. I did a rectal examination and there is no injury. I triple checked the position and I was very pleased with them. Following the anterior repair repeat cystoscopy was normal  I appropriate dissected at the level urethrovesical angle and placed a 0 Vicryl and the pelvic sidewall.  I cut a 12 x 6 cm fascia lata graft in the shape of a trapezoid 10 x 6. It was sewn in place between the 4 sutures not under tension. I trimmed an  appropriate amount of anterior vaginal wall and close anterior vaginal wall with running 2-0 Vicryl and CT1 needle. Dr. cousins performed the apical closure  She had a very capacious long and wide vagina with a diffuse grade 2 rectocele. She had deficiency of the posterior fourchette. Hymenal ring was difficult to identify. Allis clamps were applied to hymenal ring and a removed a small triangle perineal skin. I instilled 20 mL of a lidocaine epinephrine mixture. I made my usual posterior incision full length. I sharply dissected the overlying vaginal wall mucosa from the underlying rectovaginal fascia.  I did a rectal examination and she diffuse weakness. I did a posterior repair with running 2-0 Vicryl on an SH needle. I trimmed an appropriate amount of posterior vaginal wall and closed it with running 2-0 Vicryl on a CT1 needle. Prior this rectal examination was excellent.  I did a gentle perineal repair with interrupted 0 Vicryl 2. I closed the perineal skin with subcuticular suture.  She had excellent length. There is no narrowing. There was excellent support anteriorly and posteriorly. Blood loss was likely around 200 mL. A double Estrace cream pack was utilized. Leg position was good. Urine output was excellent.  Based upon her weak tissues and anatomy any recurrence may be better served with an abdominal sacral colpopexy  Hopefully the patient will reach her treatment goal

## 2015-10-11 ENCOUNTER — Encounter (HOSPITAL_COMMUNITY): Payer: Self-pay | Admitting: Obstetrics and Gynecology

## 2015-10-11 DIAGNOSIS — D251 Intramural leiomyoma of uterus: Secondary | ICD-10-CM | POA: Diagnosis not present

## 2015-10-11 DIAGNOSIS — N3946 Mixed incontinence: Secondary | ICD-10-CM | POA: Diagnosis not present

## 2015-10-11 DIAGNOSIS — Z6841 Body Mass Index (BMI) 40.0 and over, adult: Secondary | ICD-10-CM | POA: Diagnosis not present

## 2015-10-11 DIAGNOSIS — N814 Uterovaginal prolapse, unspecified: Secondary | ICD-10-CM | POA: Diagnosis not present

## 2015-10-11 DIAGNOSIS — N800 Endometriosis of uterus: Secondary | ICD-10-CM | POA: Diagnosis not present

## 2015-10-11 DIAGNOSIS — N8189 Other female genital prolapse: Secondary | ICD-10-CM | POA: Diagnosis not present

## 2015-10-11 DIAGNOSIS — Z8719 Personal history of other diseases of the digestive system: Secondary | ICD-10-CM | POA: Diagnosis not present

## 2015-10-11 DIAGNOSIS — N888 Other specified noninflammatory disorders of cervix uteri: Secondary | ICD-10-CM | POA: Diagnosis not present

## 2015-10-11 DIAGNOSIS — N816 Rectocele: Secondary | ICD-10-CM | POA: Diagnosis not present

## 2015-10-11 LAB — CBC
HCT: 37.8 % (ref 36.0–46.0)
Hemoglobin: 12.3 g/dL (ref 12.0–15.0)
MCH: 26.6 pg (ref 26.0–34.0)
MCHC: 32.5 g/dL (ref 30.0–36.0)
MCV: 81.8 fL (ref 78.0–100.0)
PLATELETS: 369 10*3/uL (ref 150–400)
RBC: 4.62 MIL/uL (ref 3.87–5.11)
RDW: 14.3 % (ref 11.5–15.5)
WBC: 10.4 10*3/uL (ref 4.0–10.5)

## 2015-10-11 LAB — BASIC METABOLIC PANEL
Anion gap: 8 (ref 5–15)
BUN: 13 mg/dL (ref 6–20)
CALCIUM: 8.5 mg/dL — AB (ref 8.9–10.3)
CO2: 23 mmol/L (ref 22–32)
CREATININE: 0.77 mg/dL (ref 0.44–1.00)
Chloride: 103 mmol/L (ref 101–111)
GFR calc Af Amer: >60 mL/min (ref >60)
Glucose, Bld: 107 mg/dL — ABNORMAL HIGH (ref 65–99)
POTASSIUM: 3.3 mmol/L — AB (ref 3.5–5.1)
SODIUM: 134 mmol/L — AB (ref 135–145)

## 2015-10-11 MED ORDER — POTASSIUM CHLORIDE CRYS ER 20 MEQ PO TBCR
40.0000 meq | EXTENDED_RELEASE_TABLET | Freq: Two times a day (BID) | ORAL | Status: DC
  Administered 2015-10-11 – 2015-10-12 (×3): 40 meq via ORAL
  Filled 2015-10-11 (×5): qty 2

## 2015-10-11 NOTE — Addendum Note (Signed)
Addendum  created 10/11/15 [id number] by Raenette Rover, CRNA   Modules edited: Clinical Notes   Clinical Notes:  File: [id number]

## 2015-10-11 NOTE — Progress Notes (Signed)
Vaginal packing removed with small amount dark drainage noted on packing, patient tolerated fair.  Foley cath removed as ordered, tolerated well.

## 2015-10-11 NOTE — Op Note (Signed)
[name], [name]              ACCOUNT NO.:  [id number]  MEDICAL RECORD NO.:  [id number]  LOCATION:  9302                          FACILITY:  [facility]  PHYSICIAN:  Servando Salina, M.D.DATE OF BIRTH:  Mar 31, 1966  DATE OF PROCEDURE:  10/10/2015 DATE OF DISCHARGE:                              OPERATIVE REPORT   PREOPERATIVE DIAGNOSES: 1. Stress urinary incontinence. 2. Uterovaginal prolapse. 3. Vault prolapse. 4. Cystocele. 5. Rectocele.  PROCEDURES: 1. Total vaginal hysterectomy. 2. Gaspar Garbe culdoplasty.  POSTOPERATIVE DIAGNOSES: 1. Cystocele. 2. Rectocele. 3. Vault prolapse. 4. Uterovaginal prolapse. 5. Stress urinary incontinence.  SURGEON:  Servando Salina, M.D.  ANESTHESIA:  General.  ASSISTANT:  Reece Packer, M.D.  DESCRIPTION OF PROCEDURE:  Under adequate general anesthesia, the patient was placed in dorsal lithotomy position.  Her legs were positioned prior to prepping and draping the patient.  Examination under anesthesia revealed a retroverted uterus.  No adnexal masses could be appreciated.  The patient's exam was limited due to the patient's body habitus.  The patient has had a prior cesarean section and tubal ligation.  The procedure was started with catheter placement in the bladder.  The patient had Pyridium prior to anesthesia induction.  A weighted speculum was placed in the vagina.  Retractor was placed anteriorly.  The cervix was grasped with a tenaculum and a Jacobson clamp.  The cervicovaginal junction was identified.  The patient has a large cystocele.  At the cervicovaginal junction, a dilute solution of lidocaine with epinephrine was injected circumferentially.  A circumferential incision was then made.  The posterior cul-de-sac was then opened and extended transversely.  The vaginal cuff was oversewn with 0 Vicryl running lock stitch.  The uterosacrals were then bilaterally clamped, cut, and suture ligated with 0 Vicryl.   Anteriorly, careful dissection was performed.  It was noted from the prior cesarean section, the bladder was scarred down.  Dissection was continued until the anterior cul-de-sac was opened.  The cardinal ligaments, uterine vessels were then serially clamped, cauterized, and cut using LigaSure. When the utero-ovarian angle was reached, the serial clamping, cauterize, and cutting of that area was performed and particularly on the left was the first area of the uterus being severed from its attachment.  The ovary was noted to be normal on the left side.  On the opposite side, the pedicles were serially clamped, cauterized, and then cut until the utero-ovarian junction was reached which was also cauterized, clamped before cauterization, and then severed from its attachment.  At that point, the uterus and cervix were removed.  A small portion of the right fallopian tube could be identified, however, copious amount of bile despite Trendelenburg was not able to obtain at the distal end of the tube which had been already severed by tubal ligation.  Multiple attempts from both sides to retrieve the tube for removal were unsuccessful and therefore that portion of the procedure was aborted.  The vaginal cuff had some bleeding and reinforced with 0 Vicryl figure-of-eight sutures.  At that point, the procedure was then turned over to Dr. Matilde Sprang who performed a cystocele repair, graft placement for the vault suspension.  When that was completed, a McCall culdoplasty was then  performed using 0 Vicryl suture and approximating both uterosacral ligaments into the midline.  The vaginal cuff was then closed with 0 Vicryl interrupted figure-of-eight sutures and uterosacral ligaments tied in the midline.  The remaining procedure included cystoscopy which was performed immediately after the hysterectomy and this was subsequently was done by Dr. Matilde Sprang as was the rectocele repair and the perineal  repair.  SPECIMEN:  Uterus with cervix sent to Pathology.  ESTIMATED BLOOD LOSS:  330.  URINE OUTPUT:  460.  INTRAOPERATIVE FLUID:  2 L.  COUNTS:  Sponge and instrument counts x2 correct.  COMPLICATIONS:  None.  The patient tolerated the procedure well, was transferred to recovery in stable condition.     Servando Salina, M.D.     [place]/MEDQ  D:  10/11/2015  T:  10/11/2015  Job:  [id number]

## 2015-10-11 NOTE — Anesthesia Postprocedure Evaluation (Signed)
Anesthesia Post Note  Patient: [name]  Procedure(s) Performed: Procedure(s) (LRB): HYSTERECTOMY VAGINAL (N/A) ANTERIOR (CYSTOCELE) AND POSTERIOR REPAIR (RECTOCELE) (N/A) VAULT PROLAPSE AND GRAFT (N/A) CYSTO (N/A)  Patient location during evaluation: [facility] Anesthesia Type: General Level of consciousness: awake, awake and alert, oriented and patient cooperative Pain management: pain level controlled Vital Signs Assessment: post-procedure vital signs reviewed and stable Respiratory status: spontaneous breathing, nonlabored ventilation and respiratory function stable Cardiovascular status: stable Postop Assessment: no headache, no backache, patient able to bend at knees and no signs of nausea or vomiting Anesthetic complications: no    Last Vitals:  Filed Vitals:   10/11/15 0145 10/11/15 0508  BP: 104/61 109/65  Pulse: 82 72  Temp: 37.1 C 37.2 C  Resp: 18 20    Last Pain:  Filed Vitals:   10/11/15 0653  PainSc: Asleep                 [name]

## 2015-10-11 NOTE — Progress Notes (Signed)
Subjective: Patient reports tolerating PO and no problems voiding.    Objective: I have reviewed patient's vital signs.  vital signs, intake and output and labs. Filed Vitals:   10/11/15 0145 10/11/15 0508  BP: 104/61 109/65  Pulse: 82 72  Temp: 98.7 F (37.1 C) 99 F (37.2 C)  Resp: 18 20   I/O last 3 completed shifts: In: 4649.8 [P.O.:1258; I.V.:3391.8] Out: 2840 [Urine:2510; Blood:330] Total I/O In: -  Out: 600 [Urine:600]  Lab Results  Component Value Date   WBC 10.4 10/11/2015   HGB 12.3 10/11/2015   HCT 37.8 10/11/2015   MCV 81.8 10/11/2015   PLT 369 10/11/2015   Lab Results  Component Value Date   CREATININE 0.77 10/11/2015    EXAM General: alert, cooperative and no distress Resp: clear to auscultation bilaterally Cardio: regular rate and rhythm, S1, S2 normal, no murmur, click, rub or gallop GI: soft, non-tender; bowel sounds normal; no masses,  no organomegaly Extremities: edema tr Vaginal Bleeding: minimal Packing out  Assessment: s/p Procedure(s): HYSTERECTOMY VAGINAL ANTERIOR (CYSTOCELE) AND POSTERIOR REPAIR (RECTOCELE) VAULT PROLAPSE AND GRAFT CYSTO: stable, progressing well and tolerating diet  Plan: Advance diet Encourage ambulation Advance to PO medication Discontinue IV fluids await flatus     [name] A, MD 10/11/2015 2:39 PM    10/11/2015, 2:39 PM

## 2015-10-11 NOTE — Progress Notes (Signed)
Looked good last night but sore Today Vitals OK Labs OK Minimal loa abd pain No leg pain Detailed post op instructions  Trial of voiding Will call monday

## 2015-10-12 DIAGNOSIS — N8189 Other female genital prolapse: Secondary | ICD-10-CM | POA: Diagnosis not present

## 2015-10-12 DIAGNOSIS — N814 Uterovaginal prolapse, unspecified: Secondary | ICD-10-CM | POA: Diagnosis not present

## 2015-10-12 DIAGNOSIS — N3946 Mixed incontinence: Secondary | ICD-10-CM | POA: Diagnosis not present

## 2015-10-12 DIAGNOSIS — N888 Other specified noninflammatory disorders of cervix uteri: Secondary | ICD-10-CM | POA: Diagnosis not present

## 2015-10-12 DIAGNOSIS — Z8719 Personal history of other diseases of the digestive system: Secondary | ICD-10-CM | POA: Diagnosis not present

## 2015-10-12 DIAGNOSIS — D251 Intramural leiomyoma of uterus: Secondary | ICD-10-CM | POA: Diagnosis not present

## 2015-10-12 DIAGNOSIS — N816 Rectocele: Secondary | ICD-10-CM | POA: Diagnosis not present

## 2015-10-12 DIAGNOSIS — Z6841 Body Mass Index (BMI) 40.0 and over, adult: Secondary | ICD-10-CM | POA: Diagnosis not present

## 2015-10-12 DIAGNOSIS — N800 Endometriosis of uterus: Secondary | ICD-10-CM | POA: Diagnosis not present

## 2015-10-12 MED ORDER — ONDANSETRON HCL 4 MG PO TABS: 4.0000 mg | ORAL_TABLET | Freq: Three times a day (TID) | ORAL | Status: DC | PRN

## 2015-10-12 MED ORDER — IBUPROFEN 800 MG PO TABS: 800.0000 mg | ORAL_TABLET | Freq: Three times a day (TID) | ORAL | Status: DC | PRN

## 2015-10-12 MED ORDER — OXYCODONE-ACETAMINOPHEN 5-325 MG PO TABS: 1.0000 | ORAL_TABLET | ORAL | Status: DC | PRN

## 2015-10-12 NOTE — Discharge Instructions (Signed)
Call if temperature greater than equal to 100.4, nothing per vagina for 4-6 weeks or severe nausea vomiting, increased incisional pain , drainage or redness in the incision site, no straining with bowel movements, showers no bath. Lifting restrictions per Dr Matilde Sprang

## 2015-10-12 NOTE — Discharge Summary (Signed)
Physician Discharge Summary  Patient ID: [name] MRN: [id number] DOB/AGE: 09-May-1966 50 y.o.  Admit date: 10/10/2015 Discharge date: 10/12/2015  Admission Diagnoses: uterovaginal prolapse, cystocele, rectocele, vault prolapse Previous cesarean section  Discharge Diagnoses:  same Principal Problem:   Uterovaginal prolapse, incomplete Active Problems:   Rectocele   Cystocele   S/P hysterectomy   Discharged Condition: stable  Hospital Course: pt underwent  TVH, vault suspension with graft, cystocele and rectocele repair, mccall culdoplasty, cystoscopy. Uncomplicated postoperative course  Consults: None  Significant Diagnostic Studies: labs:  CBC    Component Value Date/Time   WBC 10.4 10/11/2015 0530   WBC 5.5 12/10/2011   RBC 4.62 10/11/2015 0530   HGB 12.3 10/11/2015 0530   HCT 37.8 10/11/2015 0530   PLT 369 10/11/2015 0530   MCV 81.8 10/11/2015 0530   MCH 26.6 10/11/2015 0530   MCHC 32.5 10/11/2015 0530   RDW 14.3 10/11/2015 0530   LYMPHSABS 2.2 08/22/2012 0945   MONOABS 0.3 08/22/2012 0945   EOSABS 0.2 08/22/2012 0945   BASOSABS 0.0 08/22/2012 0945   BMET    Component Value Date/Time   NA 134* 10/11/2015 0530   NA 140 12/10/2011   K 3.3* 10/11/2015 0530   CL 103 10/11/2015 0530   CO2 23 10/11/2015 0530   GLUCOSE 107* 10/11/2015 0530   BUN 13 10/11/2015 0530   BUN 11 12/10/2011   CREATININE 0.77 10/11/2015 0530   CREATININE 0.7 12/10/2011   CALCIUM 8.5* 10/11/2015 0530   GFRNONAA >60 10/11/2015 0530   GFRAA >60 10/11/2015 0530      Treatments: surgery:  TVH, cystocele repair, rectocele repair, cystoscopy, vault suspension with graft  Discharge Exam: Blood pressure 121/65, pulse 85, temperature 98.6 F (37 C), temperature source Oral, resp. rate 20, height 5\' 5"  (1.651 m), weight 115.214 kg (254 lb), SpO2 100 %. General appearance: alert, cooperative and no distress Back: no tenderness to percussion or palpation Resp: clear to auscultation  bilaterally Cardio: regular rate and rhythm, S1, S2 normal, no murmur, click, rub or gallop GI: soft, non-tender; bowel sounds normal; no masses,  no organomegaly Pelvic: deferred Extremities: no edema, redness or tenderness in the calves or thighs Skin: Skin color, texture, turgor normal. No rashes or lesions Pad just changed  Disposition: 01-Home or Self Care  Discharge Instructions    Diet - low sodium heart healthy    Complete by:  As directed      Discharge instructions    Complete by:  As directed   Call if temperature greater than equal to 100.4, nothing per vagina for 4-6 weeks or severe nausea vomiting, increased incisional pain , drainage or redness in the incision site, no straining with bowel movements, showers no bath. Lifting restrictions per Dr Matilde Sprang instruction     Discharge patient    Complete by:  As directed      Increase activity slowly    Complete by:  As directed      May walk up steps    Complete by:  As directed      No wound care    Complete by:  As directed             Medication List    STOP taking these medications        phenazopyridine 200 MG tablet  Commonly known as:  PYRIDIUM      TAKE these medications        acetaZOLAMIDE 500 MG capsule  Commonly known as:  DIAMOX  Take 1 capsule (500 mg total) by mouth 2 (two) times daily.     aspirin-acetaminophen-caffeine 250-250-65 MG tablet  Commonly known as:  EXCEDRIN MIGRAINE  Take 1-2 tablets by mouth daily as needed for headache.     benazepril-hydrochlorthiazide 20-25 MG tablet  Commonly known as:  LOTENSIN HCT  Take 0.5-1 tablets by mouth 2 (two) times daily. Patient takes 1 tablet in the morning and 0.5 in the evening.     cetirizine 10 MG tablet  Commonly known as:  ZYRTEC  Take 10 mg by mouth daily.     cyclobenzaprine 10 MG tablet  Commonly known as:  FLEXERIL  Take 10 mg by mouth daily as needed for muscle spasms.     doxycycline 100 MG tablet  Commonly known as:   VIBRA-TABS  Take 100 mg by mouth 2 (two) times daily.     famotidine 20 MG tablet  Commonly known as:  PEPCID  Take 20 mg by mouth 2 (two) times daily.     guaiFENesin 100 MG/5ML Soln  Commonly known as:  ROBITUSSIN  Take 5 mLs by mouth every 4 (four) hours as needed for cough or to loosen phlegm.     ibuprofen 800 MG tablet  Commonly known as:  ADVIL,MOTRIN  Take 1 tablet (800 mg total) by mouth every 8 (eight) hours as needed for moderate pain (mild pain).     nystatin cream  Commonly known as:  MYCOSTATIN  Apply 1 application topically 2 (two) times daily.     ondansetron 4 MG tablet  Commonly known as:  ZOFRAN  Take 1 tablet (4 mg total) by mouth every 8 (eight) hours as needed for nausea.     oxyCODONE-acetaminophen 5-325 MG tablet  Commonly known as:  PERCOCET/ROXICET  Take 1-2 tablets by mouth every 4 (four) hours as needed for severe pain (moderate to severe pain (when tolerating fluids)).     potassium chloride SA 20 MEQ tablet  Commonly known as:  K-DUR,KLOR-CON  Take 20 mEq by mouth 2 (two) times daily.     sennosides-docusate sodium 8.6-50 MG tablet  Commonly known as:  SENOKOT-S  Take 2 tablets by mouth daily.     topiramate 25 MG tablet  Commonly known as:  TOPAMAX  Take 2 tablets (50 mg total) by mouth daily.     Vitamin D (Ergocalciferol) 50000 units Caps capsule  Commonly known as:  DRISDOL  Take 50,000 Units by mouth every 7 (seven) days. Every Sunday.           Follow-up Information    Follow up with Letecia Arps A, MD In 6 weeks.   Specialty:  Obstetrics and Gynecology   Contact information:   [address] [place]  [address] [phone]     f/u Dr Matilde Sprang  On monday  Signed: Lina Hitch A 10/12/2015, 10:41 AM

## 2015-10-12 NOTE — Progress Notes (Signed)
Discharge teaching complete. Pt  Understood all instructions and did not have any questions. Pt pushed via wheelchair and discharged home to family.

## 2015-10-12 NOTE — Progress Notes (Signed)
Subjective: Patient reports tolerating PO, + flatus and no problems voiding.    Objective: I have reviewed patient's vital signs.  vital signs, intake and output and labs. Filed Vitals:   10/12/15 0500 10/12/15 1014  BP: 103/64 121/65  Pulse: 66 85  Temp: 98.6 F (37 C)   Resp: 20    I/O last 3 completed shifts: In: 3209.8 [P.O.:1818; I.V.:1391.8] Out: 3350 [Urine:3350]    Lab Results  Component Value Date   WBC 10.4 10/11/2015   HGB 12.3 10/11/2015   HCT 37.8 10/11/2015   MCV 81.8 10/11/2015   PLT 369 10/11/2015   Lab Results  Component Value Date   CREATININE 0.77 10/11/2015    EXAM General: alert, cooperative and no distress Resp: clear to auscultation bilaterally Cardio: regular rate and rhythm, S1, S2 normal, no murmur, click, rub or gallop GI: soft, non-tender; bowel sounds normal; no masses,  no organomegaly Extremities: no edema, redness or tenderness in the calves or thighs Vaginal Bleeding: minimal  Assessment: s/p Procedure(s): HYSTERECTOMY VAGINAL ANTERIOR (CYSTOCELE) AND POSTERIOR REPAIR (RECTOCELE) VAULT PROLAPSE AND GRAFT CYSTO: stable, progressing well and tolerating diet  Plan: Encourage ambulation Discontinue IV fluids Discharge home  D/c instructions reviewed. F/u with myself and Dr Dossie Der, MD 10/12/2015 10:41 AM    10/12/2015, 10:41 AM

## 2015-10-17 DIAGNOSIS — Z0000 Encounter for general adult medical examination without abnormal findings: Secondary | ICD-10-CM | POA: Diagnosis not present

## 2015-10-17 DIAGNOSIS — N390 Urinary tract infection, site not specified: Secondary | ICD-10-CM | POA: Diagnosis not present

## 2015-10-23 ENCOUNTER — Ambulatory Visit: Payer: 59 | Admitting: Nurse Practitioner

## 2015-11-26 DIAGNOSIS — G4733 Obstructive sleep apnea (adult) (pediatric): Secondary | ICD-10-CM | POA: Diagnosis not present

## 2015-11-27 DIAGNOSIS — G43909 Migraine, unspecified, not intractable, without status migrainosus: Secondary | ICD-10-CM | POA: Diagnosis not present

## 2015-11-27 DIAGNOSIS — E785 Hyperlipidemia, unspecified: Secondary | ICD-10-CM | POA: Diagnosis not present

## 2015-11-27 DIAGNOSIS — G932 Benign intracranial hypertension: Secondary | ICD-10-CM | POA: Diagnosis not present

## 2015-11-27 DIAGNOSIS — I10 Essential (primary) hypertension: Secondary | ICD-10-CM | POA: Diagnosis not present

## 2015-11-29 DIAGNOSIS — I10 Essential (primary) hypertension: Secondary | ICD-10-CM | POA: Diagnosis not present

## 2015-11-29 DIAGNOSIS — Z90710 Acquired absence of both cervix and uterus: Secondary | ICD-10-CM | POA: Diagnosis not present

## 2015-11-29 DIAGNOSIS — G932 Benign intracranial hypertension: Secondary | ICD-10-CM | POA: Diagnosis not present

## 2015-11-29 DIAGNOSIS — G4730 Sleep apnea, unspecified: Secondary | ICD-10-CM | POA: Diagnosis not present

## 2015-11-29 DIAGNOSIS — E8881 Metabolic syndrome: Secondary | ICD-10-CM | POA: Diagnosis not present

## 2015-12-03 ENCOUNTER — Other Ambulatory Visit: Payer: Self-pay | Admitting: Obstetrics and Gynecology

## 2015-12-03 DIAGNOSIS — R921 Mammographic calcification found on diagnostic imaging of breast: Secondary | ICD-10-CM

## 2015-12-03 DIAGNOSIS — Z09 Encounter for follow-up examination after completed treatment for conditions other than malignant neoplasm: Secondary | ICD-10-CM | POA: Diagnosis not present

## 2015-12-10 DIAGNOSIS — N8112 Cystocele, lateral: Secondary | ICD-10-CM | POA: Diagnosis not present

## 2015-12-11 ENCOUNTER — Ambulatory Visit
Admission: RE | Admit: 2015-12-11 | Discharge: 2015-12-11 | Disposition: A | Discharge status: Home / Self Care | Payer: 59 | Source: Ambulatory Visit | Attending: Obstetrics and Gynecology | Admitting: Obstetrics and Gynecology

## 2015-12-11 DIAGNOSIS — R921 Mammographic calcification found on diagnostic imaging of breast: Secondary | ICD-10-CM

## 2015-12-11 DIAGNOSIS — R928 Other abnormal and inconclusive findings on diagnostic imaging of breast: Secondary | ICD-10-CM | POA: Diagnosis not present

## 2015-12-16 ENCOUNTER — Ambulatory Visit: Payer: 59 | Admitting: Nurse Practitioner

## 2016-03-25 DIAGNOSIS — I10 Essential (primary) hypertension: Secondary | ICD-10-CM | POA: Diagnosis not present

## 2016-03-25 DIAGNOSIS — R0789 Other chest pain: Secondary | ICD-10-CM | POA: Diagnosis not present

## 2016-03-25 DIAGNOSIS — E785 Hyperlipidemia, unspecified: Secondary | ICD-10-CM | POA: Diagnosis not present

## 2016-03-25 DIAGNOSIS — G932 Benign intracranial hypertension: Secondary | ICD-10-CM | POA: Diagnosis not present

## 2016-04-17 ENCOUNTER — Other Ambulatory Visit: Payer: Self-pay | Admitting: Obstetrics and Gynecology

## 2016-04-17 DIAGNOSIS — Z1231 Encounter for screening mammogram for malignant neoplasm of breast: Secondary | ICD-10-CM

## 2016-05-25 DIAGNOSIS — E8881 Metabolic syndrome: Secondary | ICD-10-CM | POA: Diagnosis not present

## 2016-05-25 DIAGNOSIS — I10 Essential (primary) hypertension: Secondary | ICD-10-CM | POA: Diagnosis not present

## 2016-05-25 DIAGNOSIS — Z0000 Encounter for general adult medical examination without abnormal findings: Secondary | ICD-10-CM | POA: Diagnosis not present

## 2016-05-25 DIAGNOSIS — M779 Enthesopathy, unspecified: Secondary | ICD-10-CM | POA: Diagnosis not present

## 2016-05-25 DIAGNOSIS — E669 Obesity, unspecified: Secondary | ICD-10-CM | POA: Diagnosis not present

## 2016-05-26 ENCOUNTER — Ambulatory Visit
Admission: RE | Admit: 2016-05-26 | Discharge: 2016-05-26 | Disposition: A | Discharge status: Home / Self Care | Payer: 59 | Source: Ambulatory Visit | Attending: Obstetrics and Gynecology | Admitting: Obstetrics and Gynecology

## 2016-05-26 DIAGNOSIS — Z1231 Encounter for screening mammogram for malignant neoplasm of breast: Secondary | ICD-10-CM | POA: Diagnosis not present

## 2016-06-01 DIAGNOSIS — M25561 Pain in right knee: Secondary | ICD-10-CM | POA: Diagnosis not present

## 2016-06-01 DIAGNOSIS — M25521 Pain in right elbow: Secondary | ICD-10-CM | POA: Diagnosis not present

## 2016-06-18 ENCOUNTER — Ambulatory Visit: Payer: 59 | Admitting: Neurology

## 2016-06-20 ENCOUNTER — Other Ambulatory Visit: Payer: Self-pay | Admitting: Neurology

## 2016-07-13 DIAGNOSIS — M25561 Pain in right knee: Secondary | ICD-10-CM | POA: Diagnosis not present

## 2016-07-13 DIAGNOSIS — M25521 Pain in right elbow: Secondary | ICD-10-CM | POA: Diagnosis not present

## 2016-07-30 DIAGNOSIS — E785 Hyperlipidemia, unspecified: Secondary | ICD-10-CM | POA: Diagnosis not present

## 2016-07-30 DIAGNOSIS — G932 Benign intracranial hypertension: Secondary | ICD-10-CM | POA: Diagnosis not present

## 2016-07-30 DIAGNOSIS — I10 Essential (primary) hypertension: Secondary | ICD-10-CM | POA: Diagnosis not present

## 2016-07-30 DIAGNOSIS — G43909 Migraine, unspecified, not intractable, without status migrainosus: Secondary | ICD-10-CM | POA: Diagnosis not present

## 2016-08-18 DIAGNOSIS — E559 Vitamin D deficiency, unspecified: Secondary | ICD-10-CM | POA: Diagnosis not present

## 2016-08-18 DIAGNOSIS — E8881 Metabolic syndrome: Secondary | ICD-10-CM | POA: Diagnosis not present

## 2016-08-18 DIAGNOSIS — E669 Obesity, unspecified: Secondary | ICD-10-CM | POA: Diagnosis not present

## 2016-08-18 DIAGNOSIS — I10 Essential (primary) hypertension: Secondary | ICD-10-CM | POA: Diagnosis not present

## 2016-08-19 DIAGNOSIS — G4733 Obstructive sleep apnea (adult) (pediatric): Secondary | ICD-10-CM | POA: Diagnosis not present

## 2016-11-02 ENCOUNTER — Ambulatory Visit: Payer: 59 | Admitting: Neurology

## 2016-11-19 ENCOUNTER — Other Ambulatory Visit: Payer: Self-pay | Admitting: Cardiology

## 2016-11-19 DIAGNOSIS — I209 Angina pectoris, unspecified: Secondary | ICD-10-CM | POA: Diagnosis not present

## 2016-11-19 DIAGNOSIS — M62838 Other muscle spasm: Secondary | ICD-10-CM | POA: Diagnosis not present

## 2016-11-19 DIAGNOSIS — K219 Gastro-esophageal reflux disease without esophagitis: Secondary | ICD-10-CM | POA: Diagnosis not present

## 2016-11-19 DIAGNOSIS — G932 Benign intracranial hypertension: Secondary | ICD-10-CM | POA: Diagnosis not present

## 2016-11-19 DIAGNOSIS — R079 Chest pain, unspecified: Secondary | ICD-10-CM

## 2016-11-19 DIAGNOSIS — E785 Hyperlipidemia, unspecified: Secondary | ICD-10-CM | POA: Diagnosis not present

## 2016-11-19 DIAGNOSIS — I10 Essential (primary) hypertension: Secondary | ICD-10-CM | POA: Diagnosis not present

## 2016-11-19 DIAGNOSIS — R7303 Prediabetes: Secondary | ICD-10-CM | POA: Diagnosis not present

## 2016-11-19 DIAGNOSIS — E559 Vitamin D deficiency, unspecified: Secondary | ICD-10-CM | POA: Diagnosis not present

## 2016-11-19 DIAGNOSIS — M791 Myalgia: Secondary | ICD-10-CM | POA: Diagnosis not present

## 2016-11-23 DIAGNOSIS — H5213 Myopia, bilateral: Secondary | ICD-10-CM | POA: Diagnosis not present

## 2016-11-23 DIAGNOSIS — H524 Presbyopia: Secondary | ICD-10-CM | POA: Diagnosis not present

## 2016-11-23 DIAGNOSIS — H52223 Regular astigmatism, bilateral: Secondary | ICD-10-CM | POA: Diagnosis not present

## 2016-11-23 MED FILL — CYCLOBENZAPRINE 10 MG TAB: 10 | 60 days supply | Qty: 120 | Fill #0

## 2016-11-23 MED FILL — ONDANSETRON HCL 4 MG TABLET: 4 | 20 days supply | Qty: 60 | Fill #0

## 2016-11-23 MED FILL — MAGNESIUM OXIDE 400 MG TAB: 400 (240 MG | 90 days supply | Qty: 180 | Fill #0

## 2016-11-23 MED FILL — VITAMIN D 2,000 UNIT SOFTGE: 50 MCG | 90 days supply | Qty: 90 | Fill #0

## 2016-11-23 MED FILL — FAMOTIDINE 20 MG TABLET: 20 | 90 days supply | Qty: 90 | Fill #0

## 2016-12-03 MED FILL — VITAMIN D3 50000 UNIT CAPS: 1.25 MG | 84 days supply | Qty: 12 | Fill #0

## 2016-12-03 MED FILL — BENAZEPRIL-HCTZ 20-25 MG TA: 20-25 | 90 days supply | Qty: 135 | Fill #0

## 2016-12-03 MED FILL — POTASSIUM CL ER 20 MEQ TAB: 20 | 90 days supply | Qty: 90 | Fill #0

## 2016-12-11 MED FILL — acetaZOLAMIDE ER 500 MG CP1: 500 | 30 days supply | Qty: 60 | Fill #0

## 2016-12-16 ENCOUNTER — Encounter (HOSPITAL_COMMUNITY): Payer: 59

## 2016-12-18 ENCOUNTER — Ambulatory Visit (HOSPITAL_COMMUNITY): Payer: 59

## 2017-01-01 ENCOUNTER — Encounter (HOSPITAL_COMMUNITY)
Admission: RE | Admit: 2017-01-01 | Discharge: 2017-01-01 | Disposition: A | Discharge status: Home / Self Care | Payer: 59 | Source: Ambulatory Visit | Attending: Cardiology | Admitting: Cardiology

## 2017-01-01 ENCOUNTER — Encounter (HOSPITAL_COMMUNITY): Payer: Self-pay

## 2017-01-01 DIAGNOSIS — R079 Chest pain, unspecified: Secondary | ICD-10-CM | POA: Insufficient documentation

## 2017-01-01 DIAGNOSIS — R9431 Abnormal electrocardiogram [ECG] [EKG]: Secondary | ICD-10-CM | POA: Diagnosis not present

## 2017-01-01 DIAGNOSIS — I10 Essential (primary) hypertension: Secondary | ICD-10-CM | POA: Diagnosis not present

## 2017-01-01 DIAGNOSIS — E785 Hyperlipidemia, unspecified: Secondary | ICD-10-CM | POA: Diagnosis not present

## 2017-01-01 DIAGNOSIS — E669 Obesity, unspecified: Secondary | ICD-10-CM | POA: Diagnosis not present

## 2017-01-01 DIAGNOSIS — M791 Myalgia: Secondary | ICD-10-CM | POA: Diagnosis not present

## 2017-01-01 DIAGNOSIS — I209 Angina pectoris, unspecified: Secondary | ICD-10-CM | POA: Diagnosis not present

## 2017-01-01 MED ORDER — REGADENOSON 0.4 MG/5ML IV SOLN
0.4000 mg | Freq: Once | INTRAVENOUS | Status: AC
  Administered 2017-01-01: 0.4 mg via INTRAVENOUS

## 2017-01-01 MED ORDER — TECHNETIUM TC 99M TETROFOSMIN IV KIT
30.0000 | PACK | Freq: Once | INTRAVENOUS | Status: AC | PRN
  Administered 2017-01-01: 30 via INTRAVENOUS

## 2017-01-01 MED ORDER — TECHNETIUM TC 99M TETROFOSMIN IV KIT
10.0000 | PACK | Freq: Once | INTRAVENOUS | Status: AC | PRN
  Administered 2017-01-01: 10 via INTRAVENOUS

## 2017-01-01 MED ORDER — REGADENOSON 0.4 MG/5ML IV SOLN
INTRAVENOUS | Status: AC
  Administered 2017-01-01: 0.4 mg via INTRAVENOUS
  Filled 2017-01-01: qty 5

## 2017-01-07 ENCOUNTER — Ambulatory Visit (INDEPENDENT_AMBULATORY_CARE_PROVIDER_SITE_OTHER): Payer: 59 | Admitting: Neurology

## 2017-01-07 ENCOUNTER — Encounter: Payer: Self-pay | Admitting: Neurology

## 2017-01-07 VITALS — BP 148/81 | HR 78 | Ht 64.0 in | Wt 256.0 lb

## 2017-01-07 DIAGNOSIS — G4733 Obstructive sleep apnea (adult) (pediatric): Secondary | ICD-10-CM

## 2017-01-07 DIAGNOSIS — Z9989 Dependence on other enabling machines and devices: Secondary | ICD-10-CM | POA: Diagnosis not present

## 2017-01-07 NOTE — Progress Notes (Signed)
Subjective:    Patient ID: [name] is a 51 y.o. female.  HPI     Interim history:   [name] is a 51 year old right-handed woman with an underlying medical history of allergies, morbid obesity, recurrent headaches, hypertension and reflux disease, who presents for follow-up consultation of [name] obstructive sleep apnea (overall mild, severe REM related), on treatment with CPAP. The patient is unaccompanied today. I last saw [name] on 06/24/2015, at which time she was suboptimal with [name] CPAP compliance. She was working night shift. Overall, she was doing okay, was supposed to have a hysterectomy and bladder sling operation. She was encouraged to continue to try to lose weight and try to adjust to CPAP therapy on a regular basis and make enough time for sleep. She was considering trying to switch to day shift work.   Today, 01/07/2017 (all dictated new, as well as above notes, some dictation done in note pad or Word, outside of chart, may appear as copied):   I reviewed [name] CPAP compliance data from 12/07/2016 through 01/05/2017, which is a total of 30 days, during which time she used [name] CPAP 27 days with percent used days greater than 4 hours at only 50%, average usage of 4 hours and 4 minutes, residual AHI low at 0.1 per hour, leak in the acceptable range for the 95th percentile at 16.7 L/m on a pressure of 7 cm. She reports sleeping better with CPAP. 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 No evidence of papilledema, had eye exam in 4/18, appt with Dr. Jannifer Franklin next too for HAs. Takes excedrin prn. Has had some nausea with the topamax. She also had concerns about cognitive SEs. Has had some palpitations and chest pressure, had recent myoview test under Dr. Terrence Dupont, neg.   The patient's allergies, current medications, family history, past medical history, past social history, past surgical history and problem list were reviewed and updated as appropriate.    Previously (copied from previous notes for reference):   I first met [name] on 12/11/2014 at the request of Cecille Rubin, NP and Dr. Jannifer Franklin, at which time I talked to the patient about [name] sleep study results. Patient started CPAP therapy. She had generally been slightly suboptimal with [name] CPAP compliance. She was encouraged to continue with treatment.    I reviewed [name] CPAP compliance data from 05/21/2015 through 06/19/2015 which is a total of 30 days during which time she used [name] CPAP every night with percent used days greater than 4 hours at 40% only, indicating suboptimal compliance with an average usage of 4 hours and 1 minutes, residual AHI low at 0.2 per hour, leak acceptable with the 95th percentile at 13.7 L/m on a pressure of 7 cm.   She had been referred for sleep study due to [name] report of recurrent headaches and difficulty maintaining sleep in light of shift work. The patient had a baseline sleep study followed by a CPAP titration study. I went over [name] test results with [name] in detail today. [name] baseline sleep study from 07/08/2014 showed a sleep efficiency of 75.4% with a latency to sleep of 17.5 minutes and wake after sleep onset of 77.5 minutes with mild sleep fragmentation noted. She had an increased percentage of stage II sleep, a decreased percentage of slow-wave sleep, and mildly increased percentage of REM sleep with a normal REM latency. She had rare PVCs on EKG, otherwise normal EEG and no significant PLMS. She  had mild to moderate snoring. Total AHI was 10.7 per hour, rising to 38.5 per hour during REM sleep. Average oxygen saturation was 95%, nadir was 81%. Based on a sleep study results she was invited back for a CPAP titration study. She had this on 09/19/2014. Sleep efficiency was 88.9%. Latency to sleep was 12 minutes and wake after sleep onset was 32 minutes with mild sleep fragmentation noted. She had a normal arousal index. She had normal percentages of stage I and stage II  sleep, an increased percentage of slow-wave sleep and mildly reduced percentage of REM sleep with a normal REM latency. She had no significant PLMS or EKG or EEG changes. Snoring was eliminated. Average oxygen saturation was 96%, nadir was 91%. CPAP was titrated from 5-7 cm. AHI was 0 per hour on the final pressure with supine REM sleep achieved. Based on [name] test results I prescribed CPAP therapy for home use.   I reviewed [name] CPAP compliance data from 11/10/2014 through 12/09/2014 which is a total of 30 days during which time she used [name] machine 28 days with percent used days greater than 4 hours at 67%, indicating suboptimal compliance with an average usage for all days of 4 hours and 26 minutes. Residual AHI low at 0.1 per hour and leak low with the 95th percentile of leak at 11.3 L/m on a pressure of 7 cm.  12/11/2014: She reports that she has adjusted fairly well to CPAP therapy. She works nights. She works usually 3-4 nights out of the week. She usually works from 7 PM to 7 AM. Usually she sleeps during the day. On [name] days off she tries to sleep at night. Sometimes this makes using CPAP difficult. Overall, she is pleased with how she is doing. She reports improved headaches, slightly improved right-sided tinnitus, and more daytime energy or nighttime energy when she works. She does drink 1 soda usually per night when she works. She does not drink caffeine daily. She does not smoke or drink alcohol. She has no family history of obstructive sleep apnea. She does not endorse restless leg symptoms. She went to urgent care on 12/05/2014 with a sore throat. She was diagnosed with tonsillitis. She has a history of recurrent tonsillitis growing up. She has been working on weight loss by cutting back on portion sizes and watching what she eats. She tries to walk regularly. She has been able to lose over 20 pounds in the last 2 years or so.  [name] Past Medical History Is Significant For: Past Medical History:   Diagnosis Date  . Allergy    takes Zyrtec daily  . Arthritis   . Back pain    arthritis  . Benign intracranial hypertension   . Chronic constipation    takes Stool Softener  . Dizziness   . Eczema   . Family history of anesthesia complication    mother got confused some after anesthesia  . Gastroesophageal reflux disease    takes Pepcid daily  . Headache(784.0)    benign intercranial HTN d/t CSF leak, migraines  . History of bronchitis    pt states a very long time ago  . History of MRSA infection    > 75yr ago  . Hypertension    takes Lotensin daily  . Lumbago   . Obesity, unspecified   . Peripheral edema   . Pulsatile tinnitus   . Sleep apnea   . Unspecified vitamin D deficiency     [name] Past Surgical History Is  Significant For: Past Surgical History:  Procedure Laterality Date  . ANTERIOR AND POSTERIOR REPAIR N/A 10/10/2015   Procedure: ANTERIOR (CYSTOCELE) AND POSTERIOR REPAIR (RECTOCELE);  Surgeon: Bjorn Loser, MD;  Location: [facility];  Service: Urology;  Laterality: N/A;  . CARDIOVASCULAR STRESS TEST    . CESAREAN SECTION  31yr ago  . CHOLECYSTECTOMY    . CYSTO N/A 10/10/2015   Procedure: CYSTO;  Surgeon: SBjorn Loser MD;  [facility];  Service: Urology;  Laterality: N/A;  . GALLBLADDER SURGERY  89yrago  . SINUS ENDO W/FUSION Bilateral 02/03/2013   Procedure: ENDOSCOPIC SINUS SURGERY WITH FUSION NAVIGATION;  Surgeon: MiRuby ColaMD;  [facility] Service: [facility];  Laterality: Bilateral;  Repaired CSF Leak  . [place]OCARDIOGRAPHY    . VAGINAL HYSTERECTOMY N/A 10/10/2015   Procedure: HYSTERECTOMY VAGINAL;  Surgeon: ShServando SalinaMD;  [facility];  Service: Gynecology;  Laterality: N/A;  . VAGINAL PROLAPSE REPAIR N/A 10/10/2015   Procedure: VAULT PROLAPSE AND GRAFT;  Surgeon: ScBjorn LoserMD;  [facility];  Service: Urology;  Laterality: N/A;    [name] Family History Is Significant For: Family History  Problem Relation Age of Onset   . Cancer - Prostate Father   . Hypertension Father   . Hypertension Mother   . Diabetes Mother   . Heart attack Neg Hx   . Hyperlipidemia Neg Hx   . Sudden death Neg Hx     [name] Social History Is Significant For: Social History   Social History  . Marital status: Married    Spouse name: ToNicole Kindred. Number of children: 12  . Years of education: 12+   Occupational History  .  [employer]   Social History Main Topics  . Smoking status: Never Smoker  . Smokeless tobacco: Never Used  . Alcohol use No  . Drug use: No  . Sexual activity: Yes   Other Topics Concern  . None   Social History Narrative   Patient lives at home with husband and son. (TNicole Kindred  Patient has 2 children.    Patient is currently working as an RNTherapist, sports  Patient has a college education.    Patient is right handed.    Consumes drinks 1 16oz soda 3 times a week, rarely drinks coffee.    Works night shift    [name] Allergies Are:  Allergies  Allergen Reactions  . Codeine Nausea And Vomiting  . Hydrocodone Itching  :   [name] Current Medications Are:  Outpatient Encounter Prescriptions as of 01/07/2017  Medication Sig  . acetaZOLAMIDE (DIAMOX) 500 MG capsule Take 1 capsule (500 mg total) by mouth 2 (two) times daily.  . Marland Kitchenspirin-acetaminophen-caffeine (EXCEDRIN MIGRAINE) 250-250-65 MG tablet Take 1-2 tablets by mouth daily as needed for headache.  . benazepril-hydrochlorthiazide (LOTENSIN HCT) 20-25 MG per tablet Take 0.5-1 tablets by mouth 2 (two) times daily. Patient takes 1 tablet in the morning and 0.5 in the evening.  . cetirizine (ZYRTEC) 10 MG tablet Take 10 mg by mouth daily.   . cyclobenzaprine (FLEXERIL) 10 MG tablet Take 10 mg by mouth daily as needed for muscle spasms.  . famotidine (PEPCID) 20 MG tablet Take 20 mg by mouth 2 (two) times daily.   . Magnesium Oxide 400 (240 Mg) MG TABS Take 400 mg by mouth 2 (two) times daily.  . Marland Kitchenystatin cream (MYCOSTATIN) Apply 1 application topically 2 (two) times  daily.  . ondansetron (ZOFRAN) 4 MG tablet Take 1 tablet (4  mg total) by mouth every 8 (eight) hours as needed for nausea.  . potassium chloride SA (K-DUR,KLOR-CON) 20 MEQ tablet Take 20 mEq by mouth 2 (two) times daily.  . sennosides-docusate sodium (SENOKOT-S) 8.6-50 MG tablet Take 2 tablets by mouth daily.   Marland Kitchen topiramate (TOPAMAX) 25 MG tablet Take 2 tablets (50 mg total) by mouth daily.  . Vitamin D, Ergocalciferol, (DRISDOL) 50000 units CAPS capsule Take 50,000 Units by mouth every 7 (seven) days. Every Sunday.  . [DISCONTINUED] doxycycline (VIBRA-TABS) 100 MG tablet Take 100 mg by mouth 2 (two) times daily.  . [DISCONTINUED] guaiFENesin (ROBITUSSIN) 100 MG/5ML SOLN Take 5 mLs by mouth every 4 (four) hours as needed for cough or to loosen phlegm.  . [DISCONTINUED] ibuprofen (ADVIL,MOTRIN) 800 MG tablet Take 1 tablet (800 mg total) by mouth every 8 (eight) hours as needed for moderate pain (mild pain).  . [DISCONTINUED] oxyCODONE-acetaminophen (PERCOCET/ROXICET) 5-325 MG tablet Take 1-2 tablets by mouth every 4 (four) hours as needed for severe pain (moderate to severe pain (when tolerating fluids)).   No facility-administered encounter medications on file as of 01/07/2017.   :  Review of Systems:  Out of a complete 14 point review of systems, all are reviewed and negative with the exception of these symptoms as listed below:  Review of Systems  Neurological:       Pt presents today to follow up on [name] cpap. Pt states that she feels better after using [name] cpap.    Objective:  Neurological Exam  Physical Exam Physical Examination:   Vitals:   01/07/17 1314  BP: (!) 148/81  Pulse: 78   General Examination: The patient is a very pleasant 51 y.o. female in no acute distress. She appears well-developed and well-nourished and well groomed. Good spirits.   HEENT: Normocephalic, atraumatic, pupils are equal, round and reactive to light and accommodation. Extraocular tracking is good  without limitation to gaze excursion or nystagmus noted. Normal smooth pursuit is noted. Hearing is grossly intact. Face is symmetric with normal facial animation and normal facial sensation. Speech is clear with no dysarthria noted. There is no hypophonia. There is no lip, neck/head, jaw or voice tremor. There are no carotid bruits on auscultation. Oropharynx exam reveals: mild mouth dryness, good dental hygiene and moderate airway crowding. Mallampati is class II. Tongue protrudes centrally and palate elevates symmetrically. She has a mild overbite.    Chest: Clear to auscultation without wheezing, rhonchi or crackles noted.  Heart: S1+S2+0, regular and normal without murmurs, rubs or gallops noted.   Abdomen: Soft, non-tender and non-distended with normal bowel sounds appreciated on auscultation.  Extremities: There is no pitting edema in the distal lower extremities bilaterally. Puffy ankles.   Skin: Warm and dry without trophic changes noted. There are no varicose veins.  Musculoskeletal: exam reveals no obvious joint deformities, tenderness or joint swelling or erythema.   Neurologically:  Mental status: The patient is awake, alert and oriented in all 4 spheres. [name] immediate and remote memory, attention, language skills and fund of knowledge are appropriate. There is no evidence of aphasia, agnosia, apraxia or anomia. Speech is clear with normal prosody and enunciation. Thought process is linear. Mood is normal and affect is normal.  Cranial nerves II - XII are as described above under HEENT exam. In addition: shoulder shrug is normal with equal shoulder height noted. Motor exam: Normal bulk, strength and tone is noted. There is no drift, tremor or rebound. Romberg is negative, slight sway noted. Reflexes  are 1+ throughout. Fine motor skills and coordination: grossly intact. Heel to shin is unremarkable bilaterally.   Sensory exam: intact to light touch in the upper and lower  extremities.  Gait, station and balance: She stands easily. No veering to one side is noted. No leaning to one side is noted. Posture is age-appropriate and stance is narrow based. Gait shows normal stride length and normal pace. No problems turning are noted. Tandem walk is unremarkable.                Assessment and Plan:   In summary, [name] is a very pleasant 51 year old female with an underlying medical history of allergies, morbid obesity, recurrent headaches, hypertension and reflux disease, who returns for follow-up consultation of [name] obstructive sleep apnea, on treatment with CPAP at 7 cm. She has had good compliance in the past but has had instances of falling asleep without the CPAP on. [name] husband tries to remind [name] to sleep with it consistently. 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 She has done better with [name] sleep schedule after shifting to daytime schedule. She is advised to stay consistent with [name] CPAP usage especially since she has benefited from it. She reports improved headaches and better sleep consolidation and better sleep quality when she sleeps with it. She needs a new mask. She has been up-to-date with [name] supplies in general. I suggested she try an F20 or airtouch full facemask, current mask is a full face F10. I placed an order for supplies, [name] DME is [facility]. Physical exam is stable, blood pressure little borderline today but generally is good. She also checks it at home. She is compliant with [name] medications with the exception that she discontinued [name] topiramate. She is encouraged to discuss this with Dr. Jannifer Franklin next week during [name] follow-up appointment with him.  Of note, she had a baseline sleep study in January 2016, and a CPAP titration study in March 2016 and we have previously discussed findings in detail. She had mild obstructive sleep apnea with a baseline AHI of 10.7/h,  which rose to 38.5/h during REM sleep. She had some desaturations particularly in REM sleep.  From my end of things I suggested a one-year checkup. I answered all [name] questions today and the patient was in agreement. I spent 20 minutes in total face-to-face time with the patient, more than 50% of which was spent in counseling and coordination of care, reviewing test results, reviewing medication and discussing or reviewing the diagnosis of OSA, its prognosis and treatment options. Pertinent laboratory and imaging test results that were available during this visit with the patient were reviewed by me and considered in my medical decision making (see chart for details).

## 2017-01-13 ENCOUNTER — Ambulatory Visit (INDEPENDENT_AMBULATORY_CARE_PROVIDER_SITE_OTHER): Payer: 59 | Admitting: Neurology

## 2017-01-13 ENCOUNTER — Encounter: Payer: Self-pay | Admitting: Neurology

## 2017-01-13 VITALS — BP 117/77 | HR 75 | Ht 64.0 in | Wt 261.0 lb

## 2017-01-13 DIAGNOSIS — M542 Cervicalgia: Secondary | ICD-10-CM | POA: Diagnosis not present

## 2017-01-13 DIAGNOSIS — R51 Headache: Secondary | ICD-10-CM | POA: Diagnosis not present

## 2017-01-13 DIAGNOSIS — R519 Headache, unspecified: Secondary | ICD-10-CM

## 2017-01-13 MED ORDER — NORTRIPTYLINE HCL 10 MG PO CAPS: ORAL_CAPSULE | ORAL | 3 refills | Status: DC

## 2017-01-13 MED FILL — NORTRIPTYLINE HCL 10 MG CAP: 10 | 30 days supply | Qty: 90 | Fill #0

## 2017-01-13 NOTE — Patient Instructions (Signed)
   We will check MRI of the neck and start nortriptyline at night for the headache.  Pamelor (nortriptyline) is an antidepressant medication that has many uses that may include headache, whiplash injuries, or for peripheral neuropathy pain. Side effects may include drowsiness, dry mouth, blurred vision, or constipation. As with any antidepressant medication, worsening depression may occur. If you had any significant side effects, please call our office. The full effects of this medication may take 7-10 days after starting the drug, or going up on the dose.

## 2017-01-13 NOTE — Progress Notes (Signed)
Reason for visit: Headache  [name] is an 51 y.o. female  History of present illness:  [name] is a 51 year old right-handed black female with a history of headaches that are primarily in the right frontotemporal area. The patient does report pulsatile tinnitus that may come and go. She has been seen by Dr. Donato Heinz in the past, the most recent evaluation was several weeks ago, no papilledema was noted. The patient also notes some right-sided neck discomfort and occasional pain down the right arm. The patient has been on Diamox 500 mg twice daily, she tolerates this fairly well. The patient has not been able to tolerate Topamax secondary to cognitive dysfunction. The patient has headaches that are almost daily in nature. She does have some neck stiffness with this. She returns to the office today for an evaluation. The patient has sleep apnea, she is on CPAP.  Past Medical History:  Diagnosis Date  . Allergy    takes Zyrtec daily  . Arthritis   . Back pain    arthritis  . Benign intracranial hypertension   . Chronic constipation    takes Stool Softener  . Dizziness   . Eczema   . Family history of anesthesia complication    mother got confused some after anesthesia  . Gastroesophageal reflux disease    takes Pepcid daily  . Headache(784.0)    benign intercranial HTN d/t CSF leak, migraines  . History of bronchitis    pt states a very long time ago  . History of MRSA infection    > 36yrs ago  . Hypertension    takes Lotensin daily  . Lumbago   . Obesity, unspecified   . Peripheral edema   . Pulsatile tinnitus   . Sleep apnea   . Unspecified vitamin D deficiency     Past Surgical History:  Procedure Laterality Date  . ANTERIOR AND POSTERIOR REPAIR N/A 10/10/2015   Procedure: ANTERIOR (CYSTOCELE) AND POSTERIOR REPAIR (RECTOCELE);  Surgeon: Bjorn Loser, MD;  Location: [facility];  Service: Urology;  Laterality: N/A;  . CARDIOVASCULAR STRESS TEST    .  CESAREAN SECTION  30yrs ago  . CHOLECYSTECTOMY    . CYSTO N/A 10/10/2015   Procedure: CYSTO;  Surgeon: Bjorn Loser, MD;  Location: [facility];  Service: Urology;  Laterality: N/A;  . GALLBLADDER SURGERY  79yrs ago  . SINUS ENDO W/FUSION Bilateral 02/03/2013   Procedure: ENDOSCOPIC SINUS SURGERY WITH FUSION NAVIGATION;  Surgeon: Ruby Cola, MD;  Location: [facility];  Service: ENT;  Laterality: Bilateral;  Repaired CSF Leak  . US ECHOCARDIOGRAPHY    . VAGINAL HYSTERECTOMY N/A 10/10/2015   Procedure: HYSTERECTOMY VAGINAL;  Surgeon: Servando Salina, MD;  Location: [facility];  Service: Gynecology;  Laterality: N/A;  . VAGINAL PROLAPSE REPAIR N/A 10/10/2015   Procedure: VAULT PROLAPSE AND GRAFT;  Surgeon: Bjorn Loser, MD;  Location: [facility];  Service: Urology;  Laterality: N/A;    Family History  Problem Relation Age of Onset  . Cancer - Prostate Father   . Hypertension Father   . Hypertension Mother   . Diabetes Mother   . Heart attack Neg Hx   . Hyperlipidemia Neg Hx   . Sudden death Neg Hx     Social history:  reports that she has never smoked. She has never used smokeless tobacco. She reports that she does not drink alcohol or use drugs.    Allergies  Allergen Reactions  . Codeine Nausea And Vomiting  .  Hydrocodone Itching    Medications:  Prior to Admission medications   Medication Sig Start Date End Date Taking? Authorizing Provider  acetaZOLAMIDE (DIAMOX) 500 MG capsule Take 1 capsule (500 mg total) by mouth 2 (two) times daily. 10/07/15  Yes Sater, Nanine Means, MD  aspirin-acetaminophen-caffeine (EXCEDRIN MIGRAINE) [phone] MG tablet Take 1-2 tablets by mouth daily as needed for headache.   Yes [provider]  benazepril-hydrochlorthiazide (LOTENSIN HCT) 20-25 MG per tablet Take 0.5-1 tablets by mouth 2 (two) times daily. Patient takes 1 tablet in the morning and 0.5 in the evening.   Yes [provider]  cetirizine (ZYRTEC) 10 MG tablet Take 10 mg by mouth  daily.    Yes [provider]  cyclobenzaprine (FLEXERIL) 10 MG tablet Take 10 mg by mouth daily as needed for muscle spasms.   Yes [provider]  famotidine (PEPCID) 20 MG tablet Take 20 mg by mouth 2 (two) times daily.    Yes [provider]  Magnesium Oxide 400 (240 Mg) MG TABS Take 400 mg by mouth 2 (two) times daily. 11/23/16  Yes [provider]  nystatin cream (MYCOSTATIN) Apply 1 application topically 2 (two) times daily.   Yes [provider]  ondansetron (ZOFRAN) 4 MG tablet Take 1 tablet (4 mg total) by mouth every 8 (eight) hours as needed for nausea. 10/12/15  Yes Cousins, Alanda Slim, MD  potassium chloride SA (K-DUR,KLOR-CON) 20 MEQ tablet Take 20 mEq by mouth 2 (two) times daily.   Yes [provider]  sennosides-docusate sodium (SENOKOT-S) 8.6-50 MG tablet Take 2 tablets by mouth daily.    Yes [provider]  Vitamin D, Ergocalciferol, (DRISDOL) 50000 units CAPS capsule Take 50,000 Units by mouth every 7 (seven) days. Every Sunday.   Yes [provider]  nortriptyline (PAMELOR) 10 MG capsule Take one capsule at night for one week, then take 2 capsules at night for one week, then take 3 capsules at night 01/13/17   Kathrynn Ducking, MD    ROS:  Out of a complete 14 system review of symptoms, the patient complains only of the following symptoms, and all other reviewed systems are negative.  Right ear pain Nausea Dizziness, headache  Blood pressure 117/77, pulse 75, height 5\' 4"  (1.626 m), weight 261 lb (118.4 kg).  Physical Exam  General: The patient is alert and cooperative at the time of the examination. The patient is moderately to markedly obese.  Skin: No significant peripheral edema is noted.   Neurologic Exam  Mental status: The patient is alert and oriented x 3 at the time of the examination. The patient has apparent normal recent and remote memory, with an apparently normal attention span and  concentration ability.   Cranial nerves: Facial symmetry is present. Speech is normal, no aphasia or dysarthria is noted. Extraocular movements are full. Visual fields are full. Pupils are equal, round, and reactive to light. Discs are flat bilaterally.  Motor: The patient has good strength in all 4 extremities.  Sensory examination: Soft touch sensation is symmetric on the face, arms, and legs.  Coordination: The patient has good finger-nose-finger and heel-to-shin bilaterally.  Gait and station: The patient has a normal gait. Tandem gait is normal. Romberg is negative. No drift is seen.  Reflexes: Deep tendon reflexes are symmetric.   Assessment/Plan:  1. Right sided headache  2. Neck pain, right arm pain  The patient could be having some component of cervicogenic headache. The patient will be set up  for MRI of the cervical spine, she will be placed on nortriptyline, working up to 30 mg at night. She will follow-up to this office in about 5 or 6 months. The patient will call for any dose adjustments. The right-sided headaches do not appear to be typical for pseudotumor cerebri.    Jill Alexanders MD 01/13/2017 12:49 PM  [facility] [address] [place], [place] [address]  Phone [phone] Fax [phone]

## 2017-01-15 MED FILL — NYSTATIN-TRIAMCINOLONE OINT: 100000-0.1 | 10 days supply | Qty: 45 | Fill #0

## 2017-01-20 ENCOUNTER — Ambulatory Visit (INDEPENDENT_AMBULATORY_CARE_PROVIDER_SITE_OTHER): Payer: 59

## 2017-01-20 DIAGNOSIS — R51 Headache: Secondary | ICD-10-CM

## 2017-01-20 DIAGNOSIS — M542 Cervicalgia: Secondary | ICD-10-CM | POA: Diagnosis not present

## 2017-01-20 DIAGNOSIS — R519 Headache, unspecified: Secondary | ICD-10-CM

## 2017-01-20 MED FILL — ACETAZOLAMIDE ER 500 MG CAP: 500 | 30 days supply | Qty: 60 | Fill #1

## 2017-01-22 ENCOUNTER — Telehealth: Payer: Self-pay | Admitting: Neurology

## 2017-01-22 NOTE — Telephone Encounter (Signed)
I called patient. The MRI shows some mild spinal stenosis at the C4-5 level, no other significant abnormalities.    MRI cervical 01/22/17:  IMPRESSION:  Slightly abnormal MRI cervical spine showing mild canal stenosis at C4-5 but without an compression.

## 2017-03-04 DIAGNOSIS — G4733 Obstructive sleep apnea (adult) (pediatric): Secondary | ICD-10-CM | POA: Diagnosis not present

## 2017-03-05 MED FILL — ACETAZOLAMIDE ER 500 MG CAP: 500 | 30 days supply | Qty: 60 | Fill #2

## 2017-03-17 MED FILL — BENAZEPRIL-HCTZ 20-25 MG TA: 20-25 | 90 days supply | Qty: 135 | Fill #1

## 2017-03-17 MED FILL — FAMOTIDINE 20 MG TABLET: 20 | 90 days supply | Qty: 90 | Fill #1

## 2017-03-17 MED FILL — POTASSIUM CL ER 20 MEQ TAB: 20 | 90 days supply | Qty: 90 | Fill #1

## 2017-03-17 MED FILL — VITAMIN D3 50000 UNIT CAPS: 1.25 MG | 84 days supply | Qty: 12 | Fill #1

## 2017-04-13 ENCOUNTER — Other Ambulatory Visit: Payer: Self-pay | Admitting: *Deleted

## 2017-04-13 MED ORDER — ACETAZOLAMIDE ER 500 MG PO CP12: 500.0000 mg | ORAL_CAPSULE | Freq: Two times a day (BID) | ORAL | 1 refills | Status: DC

## 2017-04-13 MED FILL — ACETAZOLAMIDE ER 500 MG CAP: 500 | 90 days supply | Qty: 180 | Fill #0

## 2017-04-28 ENCOUNTER — Other Ambulatory Visit: Payer: Self-pay | Admitting: Obstetrics and Gynecology

## 2017-04-28 DIAGNOSIS — Z1231 Encounter for screening mammogram for malignant neoplasm of breast: Secondary | ICD-10-CM

## 2017-05-05 DIAGNOSIS — Z6841 Body Mass Index (BMI) 40.0 and over, adult: Secondary | ICD-10-CM | POA: Diagnosis not present

## 2017-05-05 DIAGNOSIS — Z01419 Encounter for gynecological examination (general) (routine) without abnormal findings: Secondary | ICD-10-CM | POA: Diagnosis not present

## 2017-05-31 ENCOUNTER — Ambulatory Visit
Admission: RE | Admit: 2017-05-31 | Discharge: 2017-05-31 | Disposition: A | Discharge status: Home / Self Care | Payer: 59 | Source: Ambulatory Visit | Attending: Obstetrics and Gynecology | Admitting: Obstetrics and Gynecology

## 2017-05-31 DIAGNOSIS — Z1231 Encounter for screening mammogram for malignant neoplasm of breast: Secondary | ICD-10-CM | POA: Diagnosis not present

## 2017-06-23 MED FILL — MAGNESIUM OXIDE 400 MG TAB: 400 (240 MG | 90 days supply | Qty: 180 | Fill #1

## 2017-06-23 MED FILL — VITAMIN D3 50000 UNIT CAPS: 1.25 MG | 84 days supply | Qty: 12 | Fill #2

## 2017-06-23 MED FILL — POTASSIUM CL ER 20 MEQ TAB: 20 | 90 days supply | Qty: 90 | Fill #2

## 2017-06-23 MED FILL — BENAZEPRIL-HCTZ 20-25 MG TA: 20-25 | 90 days supply | Qty: 135 | Fill #2

## 2017-06-23 MED FILL — FAMOTIDINE 20 MG TABLET: 20 | 90 days supply | Qty: 90 | Fill #2

## 2017-06-23 MED FILL — ONDANSETRON HCL 4 MG TABLET: 4 | 20 days supply | Qty: 60 | Fill #1

## 2017-07-20 ENCOUNTER — Encounter: Payer: Self-pay | Admitting: Neurology

## 2017-07-20 ENCOUNTER — Ambulatory Visit: Payer: 59 | Admitting: Neurology

## 2017-07-20 VITALS — BP 118/68 | HR 76 | Ht 64.0 in | Wt 256.0 lb

## 2017-07-20 DIAGNOSIS — R51 Headache: Secondary | ICD-10-CM

## 2017-07-20 DIAGNOSIS — R519 Headache, unspecified: Secondary | ICD-10-CM

## 2017-07-20 NOTE — Progress Notes (Signed)
Reason for visit: Headache  [name] is an 52 y.o. female  History of present illness:  [name] is a 52 year old right-handed black female with a history of obesity and history of headache felt secondary to pseudotumor cerebri.  The patient has been on Diamox, she believes that this has been helpful, she was last time placed on nortriptyline but she could not tolerate the medication due to cognitive clouding.  She takes Flexeril on occasion for neck and shoulder stiffness.  She has been followed by Dr. Donato Heinz from optometry, the last visit revealed no evidence of papilledema.  The patient has lost 5 pounds of weight since last seen, she feels good enough that she wants to get into low impact exercise to potentiate the weight loss.  The patient has been treated for hypertension, this also seems to help her headache.  The headache may be bifrontal in nature, sometimes she will have pulsatile tinnitus.  She takes Excedrin Migraine when the headache becomes more severe and this does help some as well.  Occasionally she may have nausea, she will take Zofran for this.  She has had MRI of the cervical spine done on the last visit to exclude a cervicogenic type headache, this study was unremarkable.  Past Medical History:  Diagnosis Date  . Allergy    takes Zyrtec daily  . Arthritis   . Back pain    arthritis  . Benign intracranial hypertension   . Chronic constipation    takes Stool Softener  . Dizziness   . Eczema   . Family history of anesthesia complication    mother got confused some after anesthesia  . Gastroesophageal reflux disease    takes Pepcid daily  . Headache(784.0)    benign intercranial HTN d/t CSF leak, migraines  . History of bronchitis    pt states a very long time ago  . History of MRSA infection    > 23yrs ago  . Hypertension    takes Lotensin daily  . Lumbago   . Obesity, unspecified   . Peripheral edema   . Pulsatile tinnitus   . Sleep apnea    . Unspecified vitamin D deficiency     Past Surgical History:  Procedure Laterality Date  . ANTERIOR AND POSTERIOR REPAIR N/A 10/10/2015   Procedure: ANTERIOR (CYSTOCELE) AND POSTERIOR REPAIR (RECTOCELE);  Surgeon: Bjorn Loser, MD;  Location: [facility];  Service: Urology;  Laterality: N/A;  . CARDIOVASCULAR STRESS TEST    . CESAREAN SECTION  36yrs ago  . CHOLECYSTECTOMY    . CYSTO N/A 10/10/2015   Procedure: CYSTO;  Surgeon: Bjorn Loser, MD;  Location: [facility];  Service: Urology;  Laterality: N/A;  . GALLBLADDER SURGERY  71yrs ago  . SINUS ENDO W/FUSION Bilateral 02/03/2013   Procedure: ENDOSCOPIC SINUS SURGERY WITH FUSION NAVIGATION;  Surgeon: Ruby Cola, MD;  Location: [facility];  Service: ENT;  Laterality: Bilateral;  Repaired CSF Leak  . US ECHOCARDIOGRAPHY    . VAGINAL HYSTERECTOMY N/A 10/10/2015   Procedure: HYSTERECTOMY VAGINAL;  Surgeon: Servando Salina, MD;  Location: [facility];  Service: Gynecology;  Laterality: N/A;  . VAGINAL PROLAPSE REPAIR N/A 10/10/2015   Procedure: VAULT PROLAPSE AND GRAFT;  Surgeon: Bjorn Loser, MD;  Location: [facility];  Service: Urology;  Laterality: N/A;    Family History  Problem Relation Age of Onset  . Cancer - Prostate Father   . Hypertension Father   . Hypertension Mother   . Diabetes Mother   .  Heart attack Neg Hx   . Hyperlipidemia Neg Hx   . Sudden death Neg Hx     Social history:  reports that  has never smoked. she has never used smokeless tobacco. She reports that she does not drink alcohol or use drugs.    Allergies  Allergen Reactions  . Codeine Nausea And Vomiting  . Hydrocodone Itching    Medications:  Prior to Admission medications   Medication Sig Start Date End Date Taking? Authorizing Provider  acetaZOLAMIDE (DIAMOX) 500 MG capsule Take 1 capsule (500 mg total) by mouth 2 (two) times daily. 04/13/17  Yes Kathrynn Ducking, MD  aspirin-acetaminophen-caffeine (EXCEDRIN MIGRAINE) [phone] MG tablet Take 1-2 tablets by  mouth daily as needed for headache.   Yes [provider]  benazepril-hydrochlorthiazide (LOTENSIN HCT) 20-25 MG per tablet Take 0.5-1 tablets by mouth 2 (two) times daily. Patient takes 1 tablet in the morning and 0.5 in the evening.   Yes [provider]  cetirizine (ZYRTEC) 10 MG tablet Take 10 mg by mouth daily.    Yes [provider]  cyclobenzaprine (FLEXERIL) 10 MG tablet Take 10 mg by mouth daily as needed for muscle spasms.   Yes [provider]  famotidine (PEPCID) 20 MG tablet Take 20 mg by mouth 2 (two) times daily.    Yes [provider]  Magnesium Oxide 400 (240 Mg) MG TABS Take 400 mg by mouth 2 (two) times daily. 11/23/16  Yes [provider]  nystatin cream (MYCOSTATIN) Apply 1 application topically as needed.    Yes [provider]  ondansetron (ZOFRAN) 4 MG tablet Take 1 tablet (4 mg total) by mouth every 8 (eight) hours as needed for nausea. 10/12/15  Yes Cousins, Alanda Slim, MD  potassium chloride SA (K-DUR,KLOR-CON) 20 MEQ tablet Take 20 mEq by mouth 2 (two) times daily.   Yes [provider]  sennosides-docusate sodium (SENOKOT-S) 8.6-50 MG tablet Take 2 tablets by mouth as needed.    Yes [provider]  Vitamin D, Ergocalciferol, (DRISDOL) 50000 units CAPS capsule Take 50,000 Units by mouth every 7 (seven) days. Every Sunday.   Yes [provider]    ROS:  Out of a complete 14 system review of symptoms, the patient complains only of the following symptoms, and all other reviewed systems are negative.  Dizziness, headache  Blood pressure 118/68, pulse 76, height 5\' 4"  (1.626 m), weight 256 lb (116.1 kg), last menstrual period 12/02/2014, SpO2 98 %.  Physical Exam  General: The patient is alert and cooperative at the time of the examination.  The patient is markedly obese.  Skin: No significant peripheral edema is noted.   Neurologic Exam  Mental status: The patient is alert and  oriented x 3 at the time of the examination. The patient has apparent normal recent and remote memory, with an apparently normal attention span and concentration ability.   Cranial nerves: Facial symmetry is present. Speech is normal, no aphasia or dysarthria is noted. Extraocular movements are full. Visual fields are full.  Pupils are equal, round, and reactive to light.  Discs are somewhat blurred in the medial margins, well-defined laterally.  No venous pulsations of the veins were seen.  Motor: The patient has good strength in all 4 extremities.  Sensory examination: Soft touch sensation is symmetric on the face, arms, and legs.  Coordination: The patient has good finger-nose-finger and heel-to-shin bilaterally.  Gait and station: The patient has a normal gait. Tandem gait is normal. Romberg  is negative. No drift is seen.  Reflexes: Deep tendon reflexes are symmetric.     MRI cervical 01/22/17:  IMPRESSION: Slightly abnormal MRI cervical spine showing mild canal stenosis at C4-5 but without an compression.  Assessment/Plan:  1.  Pseudotumor cerebri  2.  Migraine headache  The patient is getting some benefit with the Diamox.  She feels somewhat better, her headaches have reduced in frequency, and may occur once or twice a week.  Stress will increase the headache.  The patient will follow up with her optometrist.  The patient will follow-up in about 6 months.  She is encouraged to get into a low grade exercise program.   Jill Alexanders MD 07/20/2017 10:27 AM  [facility] [address] [place], [place] [address]  Phone [phone] Fax [phone]

## 2017-07-22 IMAGING — NM NM MYOCAR MULTI W/SPECT W/WALL MOTION & EF
2 series · 12 of 12 positions shown · non-contrast
Comparison: None.

CLINICAL DATA: 50-year-old female with chest pain and pressure, EKG
changes

EXAM:
MYOCARDIAL IMAGING WITH SPECT (REST AND PHARMACOLOGIC-STRESS)
GATED LEFT VENTRICULAR WALL MOTION STUDY
LEFT VENTRICULAR EJECTION FRACTION
TECHNIQUE: Standard myocardial SPECT imaging was performed after resting
intravenous injection of 10 mCi 1c-AAm tetrofosmin. Subsequently,
intravenous infusion of Lexiscan was performed under the supervision
of the Cardiology staff. At peak effect of the drug, 30 mCi 1c-AAm
tetrofosmin was injected intravenously and standard myocardial SPECT
imaging was performed. Quantitative gated imaging was also performed
to evaluate left ventricular wall motion, and estimate left
ventricular ejection fraction.

[Series 2: stress gated · 6.51mm/px · 6 of 64 frames shown (1 of 2)]
[frame 6/64]
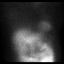
[frame 16/64]
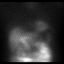
[frame 27/64]
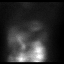
[frame 38/64]
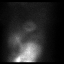
[frame 48/64]
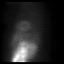
[frame 59/64]
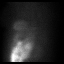

[Series 2: stress gated · 6.51mm/px · 6 of 423 frames shown (2 of 2)]
[frame 36/423  full-range]
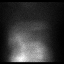
[frame 106/423  full-range]
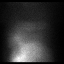
[frame 177/423  full-range]
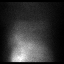
[frame 247/423  full-range]
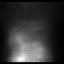
[frame 318/423  full-range]
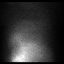
[frame 388/423  full-range]
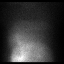

[12 of 12 positions shown; findings below may reference images not displayed]

FINDINGS: Perfusion: No decreased activity in the left ventricle on stress
imaging to suggest reversible ischemia or infarction.

Wall Motion: Normal left ventricular wall motion. No left
ventricular dilation.

Left Ventricular Ejection Fraction: 60 %

End diastolic volume 106 ml

End systolic volume 42 ml
IMPRESSION: 1. No reversible ischemia or infarction.

2. Normal left ventricular wall motion.

3. Left ventricular ejection fraction 60%

4. Non invasive risk stratification*: Low

*5025 Appropriate Use Criteria for Coronary Revascularization
Focused Update: J Am Coll Cardiol. 5025;59(9):857-881.
[URL]

## 2017-08-09 MED FILL — ACETAZOLAMIDE ER 500 MG CAP: 500 | 90 days supply | Qty: 180 | Fill #1

## 2017-08-19 DIAGNOSIS — G4733 Obstructive sleep apnea (adult) (pediatric): Secondary | ICD-10-CM | POA: Diagnosis not present

## 2017-08-26 ENCOUNTER — Ambulatory Visit (INDEPENDENT_AMBULATORY_CARE_PROVIDER_SITE_OTHER): Payer: 59

## 2017-08-26 ENCOUNTER — Encounter: Payer: Self-pay | Admitting: Podiatry

## 2017-08-26 ENCOUNTER — Ambulatory Visit: Payer: 59 | Admitting: Podiatry

## 2017-08-26 ENCOUNTER — Other Ambulatory Visit: Payer: Self-pay | Admitting: Podiatry

## 2017-08-26 VITALS — BP 121/72 | HR 78 | Resp 16

## 2017-08-26 DIAGNOSIS — M722 Plantar fascial fibromatosis: Secondary | ICD-10-CM

## 2017-08-26 DIAGNOSIS — M79672 Pain in left foot: Secondary | ICD-10-CM | POA: Diagnosis not present

## 2017-08-26 DIAGNOSIS — L600 Ingrowing nail: Secondary | ICD-10-CM | POA: Diagnosis not present

## 2017-08-26 MED ORDER — TRIAMCINOLONE ACETONIDE 10 MG/ML IJ SUSP
10.0000 mg | Freq: Once | INTRAMUSCULAR | Status: AC
  Administered 2017-08-26: 10 mg

## 2017-08-26 MED ORDER — DICLOFENAC SODIUM 75 MG PO TBEC: 75.0000 mg | DELAYED_RELEASE_TABLET | Freq: Two times a day (BID) | ORAL | 2 refills | Status: DC

## 2017-08-26 MED FILL — DICLOFENAC SODIUM 75 MG TAB: 75 | 25 days supply | Qty: 50 | Fill #0

## 2017-08-26 NOTE — Progress Notes (Signed)
   Subjective:    Patient ID: [name], female    DOB: 1965/11/13, 52 y.o.   MRN: [id number]  HPI    Review of Systems  All other systems reviewed and are negative.      Objective:   Physical Exam        Assessment & Plan:

## 2017-08-26 NOTE — Patient Instructions (Signed)

## 2017-08-29 NOTE — Progress Notes (Signed)
Subjective:   Patient ID: [name], female   DOB: 51 y.o.   MRN: [id number]   HPI Patient presents with 2-year history of discomfort in the plantar heel left with a worsening of the condition recently.  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  Patient does not smoke likes to be active and also complains of chronic ingrown toenail formation   Review of Systems  All other systems reviewed and are negative.       Objective:  Physical Exam  Constitutional: She appears well-developed and well-nourished.  Cardiovascular: Intact distal pulses.  Pulmonary/Chest: Effort normal.  Musculoskeletal: Normal range of motion.  Neurological: She is alert.  Skin: Skin is warm.  Nursing note and vitals reviewed.   Neurovascular status found to be intact muscle strength is adequate with range of motion within normal limits.  Patient does have mild equinus condition but no muscle strength loss of the Achilles and was noted to have exquisite discomfort plantar aspect left heel at the insertional point of the tendon into the calcaneus.  Moderate depression of the arch is noted with good digital perfusion and incurvated nailbeds of the hallux of a chronic nature     Assessment:  Acute plantar fasciitis left with inflammation fluid buildup along with chronic ingrown toenail deformity     Plan:  H&P conditions reviewed and x-rays reviewed with patient.  I injected the plantar fascial left 3 mg Kenalog 5 mg Xylocaine applied fascial brace instructed on physical therapy shoe gear modifications and discussed long-term ingrown toenail correction.  Reappoint to recheck again in 3 weeks or earlier if needed  X-rays indicate there is spur formation with no indications of stress fracture or arthritis

## 2017-08-30 ENCOUNTER — Ambulatory Visit (HOSPITAL_COMMUNITY)
Admission: EM | Admit: 2017-08-30 | Discharge: 2017-08-30 | Disposition: A | Discharge status: Home / Self Care | Payer: 59 | Attending: Family Medicine | Admitting: Family Medicine

## 2017-08-30 ENCOUNTER — Encounter (HOSPITAL_COMMUNITY): Payer: Self-pay

## 2017-08-30 DIAGNOSIS — B349 Viral infection, unspecified: Secondary | ICD-10-CM

## 2017-08-30 MED ORDER — BENZONATATE 100 MG PO CAPS: 100.0000 mg | ORAL_CAPSULE | Freq: Three times a day (TID) | ORAL | 0 refills | Status: DC

## 2017-08-30 MED ORDER — IPRATROPIUM BROMIDE 0.06 % NA SOLN: 2.0000 | Freq: Four times a day (QID) | NASAL | 0 refills | Status: DC

## 2017-08-30 MED ORDER — FLUTICASONE PROPIONATE 50 MCG/ACT NA SUSP: 2.0000 | Freq: Every day | NASAL | 0 refills | Status: AC

## 2017-08-30 NOTE — ED Triage Notes (Signed)
For 3 days, productive cough with green mucus, right ear pain, nasal drainage and congestion. Low grade temp. Has been taking sudafed and tylenol for body aches.

## 2017-08-30 NOTE — ED Provider Notes (Signed)
[facility]    CSN: [id number] Arrival date & time: 08/30/17  1119     History   Chief Complaint Chief Complaint  Patient presents with  . URI    HPI [name] is a 52 y.o. female.   52 year old female with history of allergies, arthritis, benign intracranial hypertension, HTN comes in for 3-day history of URI symptoms.  Has had productive cough, rhinorrhea, nasal congestion, right ear pain, body aches.  She has had some chest soreness/epigastric pain with cough.  Is also had some sore throat.  She has had low-grade fever, T-max 99-100.  OTC Sudafed, NyQuil, Tylenol with some relief.  Positive sick contact.  Never smoker.      Past Medical History:  Diagnosis Date  . Allergy    takes Zyrtec daily  . Arthritis   . Back pain    arthritis  . Benign intracranial hypertension   . Chronic constipation    takes Stool Softener  . Dizziness   . Eczema   . Family history of anesthesia complication    mother got confused some after anesthesia  . Gastroesophageal reflux disease    takes Pepcid daily  . Headache(784.0)    benign intercranial HTN d/t CSF leak, migraines  . History of bronchitis    pt states a very long time ago  . History of MRSA infection    > 55yrs ago  . Hypertension    takes Lotensin daily  . Lumbago   . Obesity, unspecified   . Peripheral edema   . Pulsatile tinnitus   . Sleep apnea   . Unspecified vitamin D deficiency     Patient Active Problem List   Diagnosis Date Noted  . Uterovaginal prolapse, incomplete 10/10/2015  . Rectocele 10/10/2015  . Cystocele 10/10/2015  . S/P hysterectomy 10/10/2015  . OSA on CPAP 10/23/2014  . Left knee pain 12/15/2012  . Headache 11/03/2012  . HTN (hypertension) 07/20/2012  . Obesity 07/20/2012    Past Surgical History:  Procedure Laterality Date  . ANTERIOR AND POSTERIOR REPAIR N/A 10/10/2015   Procedure: ANTERIOR (CYSTOCELE) AND POSTERIOR REPAIR (RECTOCELE);  Surgeon: Bjorn Loser,  MD;  Location: [facility];  Service: Urology;  Laterality: N/A;  . CARDIOVASCULAR STRESS TEST    . CESAREAN SECTION  4yrs ago  . CHOLECYSTECTOMY    . CYSTO N/A 10/10/2015   Procedure: CYSTO;  Surgeon: Bjorn Loser, MD;  Location: [facility];  Service: Urology;  Laterality: N/A;  . GALLBLADDER SURGERY  23yrs ago  . SINUS ENDO W/FUSION Bilateral 02/03/2013   Procedure: ENDOSCOPIC SINUS SURGERY WITH FUSION NAVIGATION;  Surgeon: Ruby Cola, MD;  Location: [facility];  Service: ENT;  Laterality: Bilateral;  Repaired CSF Leak  . US ECHOCARDIOGRAPHY    . VAGINAL HYSTERECTOMY N/A 10/10/2015   Procedure: HYSTERECTOMY VAGINAL;  Surgeon: Servando Salina, MD;  Location: [facility];  Service: Gynecology;  Laterality: N/A;  . VAGINAL PROLAPSE REPAIR N/A 10/10/2015   Procedure: VAULT PROLAPSE AND GRAFT;  Surgeon: Bjorn Loser, MD;  Location: [facility];  Service: Urology;  Laterality: N/A;    OB History    No data available       Home Medications    Prior to Admission medications   Medication Sig Start Date End Date Taking? Authorizing Provider  acetaZOLAMIDE (DIAMOX) 500 MG capsule Take 1 capsule (500 mg total) by mouth 2 (two) times daily. 04/13/17  Yes Kathrynn Ducking, MD  benazepril-hydrochlorthiazide (LOTENSIN HCT) 20-25 MG per tablet Take 0.5-1 tablets  by mouth 2 (two) times daily. Patient takes 1 tablet in the morning and 0.5 in the evening.   Yes [provider]  cetirizine (ZYRTEC) 10 MG tablet Take 10 mg by mouth daily.    Yes [provider]  cyclobenzaprine (FLEXERIL) 10 MG tablet Take 10 mg by mouth daily as needed for muscle spasms.   Yes [provider]  diclofenac (VOLTAREN) 75 MG EC tablet Take 1 tablet (75 mg total) by mouth 2 (two) times daily. 08/26/17  Yes Regal, Tamala Fothergill, DPM  famotidine (PEPCID) 20 MG tablet Take 20 mg by mouth 2 (two) times daily.    Yes [provider]  ondansetron (ZOFRAN) 4 MG tablet Take 1 tablet (4 mg total) by mouth every 8 (eight)  hours as needed for nausea. 10/12/15  Yes Servando Salina, MD  Vitamin D, Ergocalciferol, (DRISDOL) 50000 units CAPS capsule Take 50,000 Units by mouth every 7 (seven) days. Every Sunday.   Yes [provider]  aspirin-acetaminophen-caffeine (EXCEDRIN MIGRAINE) [phone] MG tablet Take 1-2 tablets by mouth daily as needed for headache.    [provider]  benzonatate (TESSALON) 100 MG capsule Take 1 capsule (100 mg total) by mouth every 8 (eight) hours. 08/30/17   Tasia Catchings, Amy V, PA-C  fluticasone (FLONASE) 50 MCG/ACT nasal spray Place 2 sprays into both nostrils daily. 08/30/17   Tasia Catchings, Amy V, PA-C  ipratropium (ATROVENT) 0.06 % nasal spray Place 2 sprays into both nostrils 4 (four) times daily. 08/30/17   Ok Edwards, PA-C  Magnesium Oxide 400 (240 Mg) MG TABS Take 400 mg by mouth 2 (two) times daily. 11/23/16   [provider]  nystatin cream (MYCOSTATIN) Apply 1 application topically as needed.     [provider]  potassium chloride SA (K-DUR,KLOR-CON) 20 MEQ tablet Take 20 mEq by mouth 2 (two) times daily.    [provider]  sennosides-docusate sodium (SENOKOT-S) 8.6-50 MG tablet Take 2 tablets by mouth as needed.     [provider]    Family History Family History  Problem Relation Age of Onset  . Cancer - Prostate Father   . Hypertension Father   . Hypertension Mother   . Diabetes Mother   . Heart attack Neg Hx   . Hyperlipidemia Neg Hx   . Sudden death Neg Hx     Social History Social History   Tobacco Use  . Smoking status: Never Smoker  . Smokeless tobacco: Never Used  Substance Use Topics  . Alcohol use: No    Alcohol/week: 0.0 oz  . Drug use: No     Allergies   Codeine and Hydrocodone   Review of Systems Review of Systems  Reason unable to perform ROS: See HPI as above.     Physical Exam Triage Vital Signs ED Triage Vitals  Enc Vitals Group     BP 08/30/17 1255 135/78     Pulse Rate 08/30/17 1255 75      Resp 08/30/17 1255 18     Temp 08/30/17 1255 98.5 F (36.9 C)     Temp Source 08/30/17 1255 Oral     SpO2 08/30/17 1255 100 %     Weight --      Height --      Head Circumference --      Peak Flow --      Pain Score 08/30/17 1258 4     Pain Loc --      Pain Edu? --  Excl. in GC? --    No data found.  Updated Vital Signs BP 135/78 (BP Location: Left Arm)   Pulse 75   Temp 98.5 F (36.9 C) (Oral)   Resp 18   LMP 12/02/2014   SpO2 100%   Physical Exam  Constitutional: She is oriented to person, place, and time. She appears well-developed and well-nourished. No distress.  HENT:  Head: Normocephalic and atraumatic.  Right Ear: External ear and ear canal normal. Tympanic membrane is erythematous. Tympanic membrane is not bulging.  Left Ear: External ear and ear canal normal. Tympanic membrane is erythematous. Tympanic membrane is not bulging.  Nose: Mucosal edema and rhinorrhea present. Right sinus exhibits maxillary sinus tenderness. Right sinus exhibits no frontal sinus tenderness. Left sinus exhibits maxillary sinus tenderness. Left sinus exhibits no frontal sinus tenderness.  Mouth/Throat: Uvula is midline and mucous membranes are normal. Posterior oropharyngeal erythema present. No tonsillar exudate.  Eyes: Conjunctivae are normal. Pupils are equal, round, and reactive to light.  Neck: Normal range of motion. Neck supple.  Cardiovascular: Normal rate, regular rhythm and normal heart sounds. Exam reveals no gallop and no friction rub.  No murmur heard. Pulmonary/Chest: Effort normal and breath sounds normal. She has no decreased breath sounds. She has no wheezes. She has no rhonchi. She has no rales.  Abdominal: Soft. Bowel sounds are normal. She exhibits no distension. There is no tenderness. There is no rebound and no guarding.  Lymphadenopathy:    She has no cervical adenopathy.  Neurological: She is alert and oriented to person, place, and time.  Skin: Skin is warm and  dry.  Psychiatric: She has a normal mood and affect. Her behavior is normal. Judgment normal.     UC Treatments / Results  Labs (all labs ordered are listed, but only abnormal results are displayed) Labs Reviewed - No data to display  EKG  EKG Interpretation None       Radiology No results found.  Procedures Procedures (including critical care time)  Medications Ordered in UC Medications - No data to display   Initial Impression / Assessment and Plan / UC Course  I have reviewed the triage vital signs and the nursing notes.  Pertinent labs & imaging results that were available during my care of the patient were reviewed by me and considered in my medical decision making (see chart for details).    Discussed with patient history and exam most consistent with viral URI. Symptomatic treatment as needed. Push fluids. Return precautions given.   Final Clinical Impressions(s) / UC Diagnoses   Final diagnoses:  Viral syndrome    ED Discharge Orders        Ordered    fluticasone (FLONASE) 50 MCG/ACT nasal spray  Daily     08/30/17 1325    ipratropium (ATROVENT) 0.06 % nasal spray  4 times daily     08/30/17 1325    benzonatate (TESSALON) 100 MG capsule  Every 8 hours     08/30/17 1325        Ok Edwards, [place] 08/30/17 1339

## 2017-08-30 NOTE — Discharge Instructions (Signed)
Tessalon for cough. Start flonase, atrovent nasal spray for nasal congestion/drainage. Can continue sudafed. You can use over the counter nasal saline rinse such as neti pot for nasal congestion. Keep hydrated, your urine should be clear to pale yellow in color. Tylenol/motrin for fever and pain. Monitor for any worsening of symptoms, chest pain, shortness of breath, wheezing, swelling of the throat, follow up for reevaluation.   For sore throat try using a honey-based tea. Use 3 teaspoons of honey with juice squeezed from half lemon. Place shaved pieces of ginger into 1/2-1 cup of water and warm over stove top. Then mix the ingredients and repeat every 4 hours as needed.

## 2017-09-16 ENCOUNTER — Encounter: Payer: Self-pay | Admitting: Podiatry

## 2017-09-16 ENCOUNTER — Ambulatory Visit (INDEPENDENT_AMBULATORY_CARE_PROVIDER_SITE_OTHER): Payer: 59 | Admitting: Podiatry

## 2017-09-16 DIAGNOSIS — M722 Plantar fascial fibromatosis: Secondary | ICD-10-CM | POA: Diagnosis not present

## 2017-09-16 MED ORDER — TRIAMCINOLONE ACETONIDE 10 MG/ML IJ SUSP
10.0000 mg | Freq: Once | INTRAMUSCULAR | Status: AC
  Administered 2017-09-16: 10 mg

## 2017-09-20 NOTE — Progress Notes (Signed)
Subjective:   Patient ID: [name], female   DOB: 52 y.o.   MRN: [id number]   HPI Patient presents stating she still getting pain of her left heel and is worse when she gets up in the morning and after periods of sitting   ROS      Objective:  Physical Exam  Neurovascular status intact with exquisite discomfort plantar aspect left heel at the insertional point of the tendon into the calcaneus with inflammation fluid buildup noted     Assessment:  Acute plantar fasciitis left with fluid buildup     Plan:  Reviewed physical therapy and reinjected the plantar fascial left 3 mg Kenalog 5 mg Xylocaine and applied night splint with all instructions on usage to sleep with it during the night and also to use in the evening with ice therapy.  Reappoint 3 weeks or earlier if needed

## 2017-10-07 ENCOUNTER — Ambulatory Visit: Payer: 59 | Admitting: Podiatry

## 2017-10-12 MED FILL — DICLOFENAC SOD EC 75 MG TAB: 75 | 25 days supply | Qty: 50 | Fill #1

## 2017-10-19 DIAGNOSIS — G932 Benign intracranial hypertension: Secondary | ICD-10-CM | POA: Diagnosis not present

## 2017-10-19 DIAGNOSIS — E669 Obesity, unspecified: Secondary | ICD-10-CM | POA: Diagnosis not present

## 2017-10-19 DIAGNOSIS — R7303 Prediabetes: Secondary | ICD-10-CM | POA: Diagnosis not present

## 2017-10-19 DIAGNOSIS — M62838 Other muscle spasm: Secondary | ICD-10-CM | POA: Diagnosis not present

## 2017-10-19 DIAGNOSIS — I10 Essential (primary) hypertension: Secondary | ICD-10-CM | POA: Diagnosis not present

## 2017-10-19 DIAGNOSIS — R5383 Other fatigue: Secondary | ICD-10-CM | POA: Diagnosis not present

## 2017-10-19 DIAGNOSIS — G4730 Sleep apnea, unspecified: Secondary | ICD-10-CM | POA: Diagnosis not present

## 2017-10-25 ENCOUNTER — Ambulatory Visit: Payer: 59 | Admitting: Podiatry

## 2017-10-28 ENCOUNTER — Encounter: Payer: Self-pay | Admitting: Podiatry

## 2017-10-28 ENCOUNTER — Ambulatory Visit (INDEPENDENT_AMBULATORY_CARE_PROVIDER_SITE_OTHER): Payer: 59 | Admitting: Podiatry

## 2017-10-28 DIAGNOSIS — M722 Plantar fascial fibromatosis: Secondary | ICD-10-CM

## 2017-10-28 NOTE — Progress Notes (Signed)
Subjective:   Patient ID: [name], female   DOB: 52 y.o.   MRN: [id number]   HPI patient states doing much better with minimal discomfort and only after excessive activity   ROS      Objective:  Physical Exam  Neurovascular status intact muscle strength is adequate with pain in the plantar fascia that is improved present but much better than previous     Assessment:  Fasciitis-like symptoms which is improved with utilization of aggressive ice therapy night splint usage and supportive therapy     Plan:  Discussed possibility of orthotics long-term at this time we will hold off and try to utilize continued conservative treatments with possibility for modification depending on response

## 2017-11-24 MED FILL — acetaZOLAMIDE ER 500 MG CP1: 500 | 4 days supply | Qty: 8 | Fill #0

## 2017-11-25 MED FILL — VITAMIN D3 50000 UNIT CAPS: 1.25 MG | 84 days supply | Qty: 12 | Fill #0

## 2017-11-25 MED FILL — POTASSIUM CL ER 20 MEQ TABL: 20 | 90 days supply | Qty: 90 | Fill #0

## 2017-11-25 MED FILL — BENAZEPRIL-HCTZ 20-25 MG TA: 20-25 | 90 days supply | Qty: 135 | Fill #0

## 2017-12-06 ENCOUNTER — Other Ambulatory Visit: Payer: Self-pay | Admitting: Neurology

## 2017-12-06 MED FILL — acetaZOLAMIDE ER 500 MG CP1: 500 | 90 days supply | Qty: 180 | Fill #0

## 2017-12-22 DIAGNOSIS — I10 Essential (primary) hypertension: Secondary | ICD-10-CM | POA: Diagnosis not present

## 2017-12-22 DIAGNOSIS — G932 Benign intracranial hypertension: Secondary | ICD-10-CM | POA: Diagnosis not present

## 2017-12-22 DIAGNOSIS — G43909 Migraine, unspecified, not intractable, without status migrainosus: Secondary | ICD-10-CM | POA: Diagnosis not present

## 2017-12-22 DIAGNOSIS — I208 Other forms of angina pectoris: Secondary | ICD-10-CM | POA: Diagnosis not present

## 2017-12-22 DIAGNOSIS — E559 Vitamin D deficiency, unspecified: Secondary | ICD-10-CM | POA: Diagnosis not present

## 2017-12-22 DIAGNOSIS — E785 Hyperlipidemia, unspecified: Secondary | ICD-10-CM | POA: Diagnosis not present

## 2018-01-10 ENCOUNTER — Ambulatory Visit: Payer: 59 | Admitting: Neurology

## 2018-01-11 MED FILL — MAGNESIUM OXIDE 400 MG TAB: 400 (240 MG | 90 days supply | Qty: 180 | Fill #0

## 2018-01-11 MED FILL — FAMOTIDINE 20 MG TABLET: 20 | 90 days supply | Qty: 90 | Fill #0

## 2018-01-18 ENCOUNTER — Ambulatory Visit: Payer: 59 | Admitting: Neurology

## 2018-02-03 DIAGNOSIS — G4733 Obstructive sleep apnea (adult) (pediatric): Secondary | ICD-10-CM | POA: Diagnosis not present

## 2018-02-15 ENCOUNTER — Ambulatory Visit: Payer: 59 | Admitting: Neurology

## 2018-02-28 ENCOUNTER — Other Ambulatory Visit: Payer: Self-pay | Admitting: *Deleted

## 2018-02-28 ENCOUNTER — Telehealth: Payer: Self-pay | Admitting: *Deleted

## 2018-02-28 MED ORDER — ACETAZOLAMIDE ER 500 MG PO CP12: 500.0000 mg | ORAL_CAPSULE | Freq: Two times a day (BID) | ORAL | 0 refills | Status: DC

## 2018-02-28 MED FILL — ACETAZOLAMIDE ER 500 MG CAP: 500 | 90 days supply | Qty: 180 | Fill #0

## 2018-02-28 NOTE — Telephone Encounter (Signed)
Called pt. She was on wait list. Offered tomorrow at 12pm. She declined. She has dentist appt. She opted to keep 06/13/18 appt. Refilled her diamox. She will call if she has further questions/concerns. Took her off wait list as requested.

## 2018-03-13 ENCOUNTER — Encounter: Payer: Self-pay | Admitting: Neurology

## 2018-03-15 ENCOUNTER — Ambulatory Visit: Payer: 59 | Admitting: Neurology

## 2018-03-15 ENCOUNTER — Encounter: Payer: Self-pay | Admitting: Neurology

## 2018-03-15 VITALS — BP 138/84 | HR 75 | Ht 65.0 in | Wt 256.0 lb

## 2018-03-15 DIAGNOSIS — G4730 Sleep apnea, unspecified: Secondary | ICD-10-CM | POA: Diagnosis not present

## 2018-03-15 DIAGNOSIS — G932 Benign intracranial hypertension: Secondary | ICD-10-CM | POA: Diagnosis not present

## 2018-03-15 DIAGNOSIS — Z789 Other specified health status: Secondary | ICD-10-CM

## 2018-03-15 DIAGNOSIS — Z9989 Dependence on other enabling machines and devices: Secondary | ICD-10-CM

## 2018-03-15 DIAGNOSIS — R7303 Prediabetes: Secondary | ICD-10-CM | POA: Diagnosis not present

## 2018-03-15 DIAGNOSIS — R5383 Other fatigue: Secondary | ICD-10-CM | POA: Diagnosis not present

## 2018-03-15 DIAGNOSIS — R252 Cramp and spasm: Secondary | ICD-10-CM | POA: Diagnosis not present

## 2018-03-15 DIAGNOSIS — E669 Obesity, unspecified: Secondary | ICD-10-CM | POA: Diagnosis not present

## 2018-03-15 DIAGNOSIS — I10 Essential (primary) hypertension: Secondary | ICD-10-CM | POA: Diagnosis not present

## 2018-03-15 DIAGNOSIS — G4733 Obstructive sleep apnea (adult) (pediatric): Secondary | ICD-10-CM

## 2018-03-15 DIAGNOSIS — E559 Vitamin D deficiency, unspecified: Secondary | ICD-10-CM | POA: Diagnosis not present

## 2018-03-15 DIAGNOSIS — M62838 Other muscle spasm: Secondary | ICD-10-CM | POA: Diagnosis not present

## 2018-03-15 NOTE — Patient Instructions (Signed)
Please continue using your CPAP regularly. While your insurance requires that you use CPAP at least 4 hours each night on 70% of the nights, I recommend, that you not skip any nights and use it throughout the night if you can. Getting used to CPAP and staying with the treatment long term does take time and patience and discipline. Untreated obstructive sleep apnea when it is moderate to severe can have an adverse impact on cardiovascular health and raise her risk for heart disease, arrhythmias, hypertension, congestive heart failure, stroke and diabetes. Untreated obstructive sleep apnea causes sleep disruption, nonrestorative sleep, and sleep deprivation. This can have an impact on your day to day functioning and cause daytime sleepiness and impairment of cognitive function, memory loss, mood disturbance, and problems focussing. Using CPAP regularly can improve these symptoms. Please schedule a mask refit appointment with [facility].  We can see you in 1 year, you can see one of our nurse practitioners.

## 2018-03-15 NOTE — Progress Notes (Signed)
Subjective:    Patient ID: [name] is a 52 y.o. female.  HPI     Interim history:   Ms. Recht is a 52 year old right-handed woman with an underlying medical history of allergies, morbid obesity, recurrent headaches, hypertension and reflux disease, who presents for follow-up consultation of her obstructive sleep apnea (overall mild, severe REM related), on treatment with CPAP. The patient is unaccompanied today. I last saw her on 01/07/2017, at which time she was suboptimal with her CPAP compliance, she was having difficulty with her sleep schedule as she was working nights. She was switched to days. She had cardiac workup for chest pain. She was advised to be fully compliant with CPAP therapy.   Today, 03/15/2018: I reviewed her CPAP compliance data from 02/12/2018 through 03/13/2018 which is a total of 30 days, during which time she used her CPAP only 21 days with percent used days greater than 4 hours at 17%, indicating significantly suboptimal compliance, her compliance overall was just a little bit better in the past 90 days. She reports she struggles with facial sweating and the FFM tends to slide off, she also has had a tendency to pull the mask off in the middle of the night, without realizing. She does typically feel better when she is able to use her CPAP consistently, her morning headaches in particular tend to be better when she has been on CPAP for most of the night.   The patient's allergies, current medications, family history, past medical history, past social history, past surgical history and problem list were reviewed and updated as appropriate.    Previously (copied from previous notes for reference):    I saw her on 06/24/2015, at which time she was suboptimal with her CPAP compliance. She was working night shift. Overall, she was doing okay, was supposed to have a hysterectomy and bladder sling operation. She was encouraged to continue to try to lose weight and try  to adjust to CPAP therapy on a regular basis and make enough time for sleep. She was considering trying to switch to day shift work.    I reviewed her CPAP compliance data from 12/07/2016 through 01/05/2017, which is a total of 30 days, during which time she used her CPAP 27 days with percent used days greater than 4 hours at only 50%, average usage of 4 hours and 4 minutes, residual AHI low at 0.1 per hour, leak in the acceptable range for the 95th percentile at 16.7 L/m on a pressure of 7 cm.  I first met her on 12/11/2014 at the request of Cecille Rubin, NP and Dr. Jannifer Franklin, at which time I talked to the patient about her sleep study results. Patient started CPAP therapy. She had generally been slightly suboptimal with her CPAP compliance. She was encouraged to continue with treatment.    I reviewed her CPAP compliance data from 05/21/2015 through 06/19/2015 which is a total of 30 days during which time she used her CPAP every night with percent used days greater than 4 hours at 40% only, indicating suboptimal compliance with an average usage of 4 hours and 1 minutes, residual AHI low at 0.2 per hour, leak acceptable with the 95th percentile at 13.7 L/m on a pressure of 7 cm.   She had been referred for sleep study due to her report of recurrent headaches and difficulty maintaining sleep in light of shift work. The patient had a baseline sleep study followed by a CPAP titration study. I went over  her test results with her in detail today. Her baseline sleep study from 07/08/2014 showed a sleep efficiency of 75.4% with a latency to sleep of 17.5 minutes and wake after sleep onset of 77.5 minutes with mild sleep fragmentation noted. She had an increased percentage of stage II sleep, a decreased percentage of slow-wave sleep, and mildly increased percentage of REM sleep with a normal REM latency. She had rare PVCs on EKG, otherwise normal EEG and no significant PLMS. She had mild to moderate snoring. Total AHI  was 10.7 per hour, rising to 38.5 per hour during REM sleep. Average oxygen saturation was 95%, nadir was 81%. Based on a sleep study results she was invited back for a CPAP titration study. She had this on 09/19/2014. Sleep efficiency was 88.9%. Latency to sleep was 12 minutes and wake after sleep onset was 32 minutes with mild sleep fragmentation noted. She had a normal arousal index. She had normal percentages of stage I and stage II sleep, an increased percentage of slow-wave sleep and mildly reduced percentage of REM sleep with a normal REM latency. She had no significant PLMS or EKG or EEG changes. Snoring was eliminated. Average oxygen saturation was 96%, nadir was 91%. CPAP was titrated from 5-7 cm. AHI was 0 per hour on the final pressure with supine REM sleep achieved. Based on her test results I prescribed CPAP therapy for home use.   I reviewed her CPAP compliance data from 11/10/2014 through 12/09/2014 which is a total of 30 days during which time she used her machine 28 days with percent used days greater than 4 hours at 67%, indicating suboptimal compliance with an average usage for all days of 4 hours and 26 minutes. Residual AHI low at 0.1 per hour and leak low with the 95th percentile of leak at 11.3 L/m on a pressure of 7 cm.   12/11/2014: She reports that she has adjusted fairly well to CPAP therapy. She works nights. She works usually 3-4 nights out of the week. She usually works from 7 PM to 7 AM. Usually she sleeps during the day. On her days off she tries to sleep at night. Sometimes this makes using CPAP difficult. Overall, she is pleased with how she is doing. She reports improved headaches, slightly improved right-sided tinnitus, and more daytime energy or nighttime energy when she works. She does drink 1 soda usually per night when she works. She does not drink caffeine daily. She does not smoke or drink alcohol. She has no family history of obstructive sleep apnea. She does not  endorse restless leg symptoms. She went to urgent care on 12/05/2014 with a sore throat. She was diagnosed with tonsillitis. She has a history of recurrent tonsillitis growing up. She has been working on weight loss by cutting back on portion sizes and watching what she eats. She tries to walk regularly. She has been able to lose over 20 pounds in the last 2 years or so.  Her Past Medical History Is Significant For: Past Medical History:  Diagnosis Date  . Allergy    takes Zyrtec daily  . Arthritis   . Back pain    arthritis  . Benign intracranial hypertension   . Chronic constipation    takes Stool Softener  . Dizziness   . Eczema   . Family history of anesthesia complication    mother got confused some after anesthesia  . Gastroesophageal reflux disease    takes Pepcid daily  . Headache(784.0)  benign intercranial HTN d/t CSF leak, migraines  . History of bronchitis    pt states a very long time ago  . History of MRSA infection    > 56yr ago  . Hypertension    takes Lotensin daily  . Lumbago   . Obesity, unspecified   . Peripheral edema   . Pulsatile tinnitus   . Sleep apnea   . Unspecified vitamin D deficiency     Her Past Surgical History Is Significant For: Past Surgical History:  Procedure Laterality Date  . ANTERIOR AND POSTERIOR REPAIR N/A 10/10/2015   Procedure: ANTERIOR (CYSTOCELE) AND POSTERIOR REPAIR (RECTOCELE);  Surgeon: SBjorn Loser MD;  [facility];  Service: Urology;  Laterality: N/A;  . CARDIOVASCULAR STRESS TEST    . CESAREAN SECTION  [id number]  . CHOLECYSTECTOMY    . CYSTO N/A 10/10/2015   Procedure: CYSTO;  Surgeon: ScBjorn LoserMD;  [facility];  Service: Urology;  Laterality: N/A;  . GALLBLADDER SURGERY  [id number]  . SINUS ENDO W/FUSION Bilateral 02/03/2013   Procedure: ENDOSCOPIC SINUS SURGERY WITH FUSION NAVIGATION;  Surgeon: MitRuby ColaD;  [facility];  Laterality: Bilateral;  Repaired CSF Leak  . [place]OCARD[place]GRAPHY    . VAGINAL HYSTERECTOMY N/A 10/10/2015   Procedure: HYSTERECTOMY VAGINAL;  Surgeon: SheServando SalinaD;  [facility];  Service: Gynecology;  Laterality: N/A;  . VAGINAL PROLAPSE REPAIR N/A 10/10/2015   Procedure: VAULT PROLAPSE AND GRAFT;  Surgeon: ScoBjorn LoserD;  [facility];  Service: Urology;  Laterality: N/A;    Her Family History Is Significant For: Family History  Problem Relation Age of Onset  . Cancer - Prostate Father   . Hypertension Father   . Hypertension Mother   . Diabetes Mother   . Heart attack Neg Hx   . Hyperlipidemia Neg Hx   . Sudden death Neg Hx     Her Social History Is Significant For: Social History   Socioeconomic History  . Marital status: Married    Spouse name: TonNicole Kindred Number of children: 12  . Years of education: 12+  . Highest education level: Not on file  Occupational History    Employer: [employer]  . Financial resource strain: Not on file  . Food insecurity:    Worry: Not on file    Inability: Not on file  . Transportation needs:    Medical: Not on file    Non-medical: Not on file  Tobacco Use  . Smoking status: Never Smoker  . Smokeless tobacco: Never Used  Substance and Sexual Activity  . Alcohol use: No    Alcohol/week: 0.0 standard drinks  . Drug use: No  . Sexual activity: Yes  Lifestyle  . Physical activity:    Days per week: Not on file    Minutes per session: Not on file  . Stress: Not on file  Relationships  . Social connections:    Talks on phone: Not on file    Gets together: Not on file    Attends religious service: Not on file    Active member of club or organization: Not on file    Attends meetings of clubs or organizations: Not on file    Relationship status: Not on file  Other Topics Concern  . Not on file  Social History Narrative   Patient lives at home with husband and son. (ToNicole Kindred Patient has 2 children.  Patient is currently working as an Therapist, sports.    Patient has a college education.    Patient is right handed.    Consumes drinks 1 16oz soda 3 times a week, rarely drinks coffee.    Works night shift    Her Allergies Are:  Allergies  Allergen Reactions  . Codeine Nausea And Vomiting  . Hydrocodone Itching  :   Her Current Medications Are:  Outpatient Encounter Medications as of 03/15/2018  Medication Sig  . acetaZOLAMIDE (DIAMOX) 500 MG capsule Take 1 capsule (500 mg total) by mouth 2 (two) times daily.  Marland Kitchen aspirin-acetaminophen-caffeine (EXCEDRIN MIGRAINE) 250-250-65 MG tablet Take 1-2 tablets by mouth daily as needed for headache.  . benazepril-hydrochlorthiazide (LOTENSIN HCT) 20-25 MG per tablet Take 0.5-1 tablets by mouth 2 (two) times daily. Patient takes 1 tablet in the morning and 0.5 in the evening.  . cetirizine (ZYRTEC) 10 MG tablet Take 10 mg by mouth daily.   . cyclobenzaprine (FLEXERIL) 10 MG tablet Take 10 mg by mouth daily as needed for muscle spasms.  . diclofenac (VOLTAREN) 75 MG EC tablet Take 1 tablet (75 mg total) by mouth 2 (two) times daily.  . famotidine (PEPCID) 20 MG tablet Take 20 mg by mouth 2 (two) times daily.   . fluticasone (FLONASE) 50 MCG/ACT nasal spray Place 2 sprays into both nostrils daily.  Marland Kitchen ipratropium (ATROVENT) 0.06 % nasal spray Place 2 sprays into both nostrils 4 (four) times daily.  . Magnesium Oxide 400 (240 Mg) MG TABS Take 400 mg by mouth 2 (two) times daily.  . ondansetron (ZOFRAN) 4 MG tablet Take 1 tablet (4 mg total) by mouth every 8 (eight) hours as needed for nausea.  . potassium chloride SA (K-DUR,KLOR-CON) 20 MEQ tablet Take 20 mEq by mouth 2 (two) times daily.  . sennosides-docusate sodium (SENOKOT-S) 8.6-50 MG tablet Take 2 tablets by mouth as needed.   . Vitamin D, Ergocalciferol, (DRISDOL) 50000 units CAPS capsule Take 50,000 Units by mouth every 7 (seven) days. Every Sunday.  . [DISCONTINUED] benzonatate (TESSALON) 100 MG capsule Take 1 capsule (100 mg total) by mouth every 8  (eight) hours.  . [DISCONTINUED] nystatin cream (MYCOSTATIN) Apply 1 application topically as needed.    No facility-administered encounter medications on file as of 03/15/2018.   :  Review of Systems:  Out of a complete 14 point review of systems, all are reviewed and negative with the exception of these symptoms as listed below: Review of Systems  Neurological:       Pt presents today to discuss her cpap. Pt reports that she sometimes takes her mask off at night. Pt feels better on PAP therapy.    Objective:  Neurological Exam  Physical Exam Physical Examination:   Vitals:   03/15/18 0937  BP: (!) 150/101  Pulse: 75   Recheck blood pressure at the end of the visit was 138/94.  General Examination: The patient is a very pleasant 52 y.o. female in no acute distress. She appears well-developed and well-nourishedin and well groomed.   HEENT:Normocephalic, atraumatic, pupils are equal, round and reactive to light and accommodation. Extraocular tracking is good without limitation to gaze excursion or nystagmus noted. Normal smooth pursuit is noted. Hearing is grossly intact. Face is symmetric with normal facial animation and normal facial sensation. Speech is clear with no dysarthria noted. There is no hypophonia. There is no lip, neck/head, jaw or voice tremor. There are no carotid bruits on auscultation. Oropharynx exam reveals: mild mouth dryness,  good dental hygiene and moderate airway crowding. Mallampati is class II. Tongue protrudes centrally and palate elevates symmetrically. She has a mild overbite.   Chest:Clear to auscultation without wheezing, rhonchi or crackles noted.  Heart:S1+S2+0, regular and normal without murmurs, rubs or gallops noted.   Abdomen:Soft, non-tender and non-distended with normal bowel sounds appreciated on auscultation.  Extremities:There is no pitting edema in the distal lower extremities bilaterally. Puffy ankles, not new.   Skin: Warm  and dry without trophic changes noted. There are no varicose veins.  Musculoskeletal: exam reveals no obvious joint deformities, tenderness or joint swelling or erythema.   Neurologically:  Mental status: The patient is awake, alert and oriented in all 4 spheres. Her immediate and remote memory, attention, language skills and fund of knowledge are appropriate. There is no evidence of aphasia, agnosia, apraxia or anomia. Speech is clear with normal prosody and enunciation. Thought process is linear. Mood is normal and affect is normal.  Cranial nerves II - XII are as described above under HEENT exam. In addition: shoulder shrug is normal with equal shoulder height noted. Motor exam: Normal bulk, strength and tone is noted. There is no drift, tremor or rebound. Fine motor skills and coordination: grossly intact.   Sensory exam: intact to light touch in the upper and lower extremities.  Gait, station and balance: She stands easily. No veering to one side is noted. No leaning to one side is noted. Posture is age-appropriate and stance is narrow based. Gait shows normal stride length and normal pace. No problems turning are noted.  Assessment and Plan:   In summary, Avina Eberle Martell is a very pleasant 52 year old female with an underlying medical history of allergies, morbid obesity, recurrent headaches, hypertension and reflux disease, whoreturns for follow-up consultation of her obstructive sleep apnea, on treatment with CPAP at 7 cm. She has had good compliance in the past but has a decline in her compliance. She has experienced more difficulty keeping the mask on. She tends to pull it off at night but also has some discomfort because of excess moisture in the face and facial sweating. She is advised to schedule a mask refit appointment with her DME company. She may be a candidate for the air touch full face mask. A chin strap may also help if needed.She feels better when she is able to  use her CPAP in particular with her morning headaches. She is motivated to continue with treatment. She is working on weight loss. We can revisit the need for a repeat sleep study may be next year around this time if she continues to lose weight. She is in agreement. I suggested a one-year checkup with one of our nurse practitioners. Of note, she had a baseline sleep study in January 2016, and a CPAP titration study in March 2016 and we have previously discussed findings in detail. She had mild obstructive sleep apnea with a baseline AHI of 10.7/h, which rose to 38.5/h during REM sleep. She had some desaturations particularly in REM sleep, as low as 81%.  I answered all her questions today and the patient was in agreement.  I spent 20 minutes in total face-to-face time with the patient, more than 50% of which was spent in counseling and coordination of care, reviewing test results, reviewing medication and discussing or reviewing the diagnosis of OSA, its prognosis and treatment options. Pertinent laboratory and imaging test results that were available during this visit with the patient were reviewed by me and considered  in my medical decision making (see chart for details).

## 2018-04-04 MED FILL — FAMOTIDINE 20 MG TABLET: 20 | 90 days supply | Qty: 90 | Fill #1

## 2018-04-04 MED FILL — BENAZEPRIL-HCTZ 20-25 MG TA: 20-25 | 90 days supply | Qty: 135 | Fill #1

## 2018-04-04 MED FILL — VITAMIN D3 50000 UNIT CAPS: 1.25 MG | 84 days supply | Qty: 12 | Fill #1

## 2018-04-28 ENCOUNTER — Other Ambulatory Visit: Payer: Self-pay | Admitting: Obstetrics and Gynecology

## 2018-04-28 DIAGNOSIS — Z1231 Encounter for screening mammogram for malignant neoplasm of breast: Secondary | ICD-10-CM

## 2018-05-24 DIAGNOSIS — G932 Benign intracranial hypertension: Secondary | ICD-10-CM | POA: Diagnosis not present

## 2018-05-24 DIAGNOSIS — R252 Cramp and spasm: Secondary | ICD-10-CM | POA: Diagnosis not present

## 2018-05-24 DIAGNOSIS — I10 Essential (primary) hypertension: Secondary | ICD-10-CM | POA: Diagnosis not present

## 2018-05-24 DIAGNOSIS — G4730 Sleep apnea, unspecified: Secondary | ICD-10-CM | POA: Diagnosis not present

## 2018-05-27 DIAGNOSIS — I10 Essential (primary) hypertension: Secondary | ICD-10-CM | POA: Diagnosis not present

## 2018-05-27 DIAGNOSIS — G43909 Migraine, unspecified, not intractable, without status migrainosus: Secondary | ICD-10-CM | POA: Diagnosis not present

## 2018-05-27 DIAGNOSIS — E785 Hyperlipidemia, unspecified: Secondary | ICD-10-CM | POA: Diagnosis not present

## 2018-05-27 DIAGNOSIS — G932 Benign intracranial hypertension: Secondary | ICD-10-CM | POA: Diagnosis not present

## 2018-05-27 DIAGNOSIS — I208 Other forms of angina pectoris: Secondary | ICD-10-CM | POA: Diagnosis not present

## 2018-05-27 DIAGNOSIS — E559 Vitamin D deficiency, unspecified: Secondary | ICD-10-CM | POA: Diagnosis not present

## 2018-06-09 ENCOUNTER — Ambulatory Visit
Admission: RE | Admit: 2018-06-09 | Discharge: 2018-06-09 | Disposition: A | Discharge status: Home / Self Care | Payer: 59 | Source: Ambulatory Visit | Attending: Obstetrics and Gynecology | Admitting: Obstetrics and Gynecology

## 2018-06-09 DIAGNOSIS — Z1231 Encounter for screening mammogram for malignant neoplasm of breast: Secondary | ICD-10-CM

## 2018-06-10 ENCOUNTER — Other Ambulatory Visit: Payer: Self-pay | Admitting: Obstetrics and Gynecology

## 2018-06-10 DIAGNOSIS — R928 Other abnormal and inconclusive findings on diagnostic imaging of breast: Secondary | ICD-10-CM

## 2018-06-13 ENCOUNTER — Ambulatory Visit: Payer: 59 | Admitting: Neurology

## 2018-06-13 ENCOUNTER — Encounter: Payer: Self-pay | Admitting: Neurology

## 2018-06-13 ENCOUNTER — Encounter

## 2018-06-13 DIAGNOSIS — G43019 Migraine without aura, intractable, without status migrainosus: Secondary | ICD-10-CM | POA: Insufficient documentation

## 2018-06-13 MED ORDER — ACETAZOLAMIDE ER 500 MG PO CP12: 500.0000 mg | ORAL_CAPSULE | Freq: Two times a day (BID) | ORAL | 3 refills | Status: DC

## 2018-06-13 MED ORDER — RIZATRIPTAN BENZOATE 10 MG PO TABS: 10.0000 mg | ORAL_TABLET | Freq: Three times a day (TID) | ORAL | 3 refills | Status: DC | PRN

## 2018-06-13 MED ORDER — VENLAFAXINE HCL ER 37.5 MG PO CP24: ORAL_CAPSULE | ORAL | 3 refills | Status: DC

## 2018-06-13 MED FILL — RIZATRIPTAN BENZOATE 10 MG: 10 | 17 days supply | Qty: 10 | Fill #0

## 2018-06-13 MED FILL — VENLAFAXINE HCL ER 37.5 MG: 37.5 | 30 days supply | Qty: 30 | Fill #0

## 2018-06-13 MED FILL — ACETAZOLAMIDE ER 500 MG CAP: 500 | 90 days supply | Qty: 180 | Fill #0

## 2018-06-13 NOTE — Progress Notes (Signed)
Reason for visit: Migraine headache  [name] is an 52 y.o. female  History of present illness:  [name] is a 52 year old right-handed black female with a history of obesity, she has been felt to have pseudotumor cerebri, she has been on Diamox taking 500 mg twice daily.  Her optometrist has found that her papilledema has disappeared, however.  The patient continues to have headaches on average twice a week, she may have nausea with the headache, she may occasionally miss work because of the headache.  The patient likely has underlying migraine.  She has sleep apnea on CPAP, if she does not use her CPAP she may have increased reconceive headache.  The patient is tolerating the Diamox fairly well.  She does believe that this has helped the frequency of her headaches.  She returns for an evaluation.  Past Medical History:  Diagnosis Date  . Allergy    takes Zyrtec daily  . Arthritis   . Back pain    arthritis  . Benign intracranial hypertension   . Chronic constipation    takes Stool Softener  . Dizziness   . Eczema   . Family history of anesthesia complication    mother got confused some after anesthesia  . Gastroesophageal reflux disease    takes Pepcid daily  . Headache(784.0)    benign intercranial HTN d/t CSF leak, migraines  . History of bronchitis    pt states a very long time ago  . History of MRSA infection    > 49yrs ago  . Hypertension    takes Lotensin daily  . Lumbago   . Obesity, unspecified   . Peripheral edema   . Pulsatile tinnitus   . Sleep apnea   . Unspecified vitamin D deficiency     Past Surgical History:  Procedure Laterality Date  . ANTERIOR AND POSTERIOR REPAIR N/A 10/10/2015   Procedure: ANTERIOR (CYSTOCELE) AND POSTERIOR REPAIR (RECTOCELE);  Surgeon: Bjorn Loser, MD;  Location: [facility];  Service: Urology;  Laterality: N/A;  . CARDIOVASCULAR STRESS TEST    . CESAREAN SECTION  32yrs ago  . CHOLECYSTECTOMY    . CYSTO N/A 10/10/2015    Procedure: CYSTO;  Surgeon: Bjorn Loser, MD;  Location: [facility];  Service: Urology;  Laterality: N/A;  . GALLBLADDER SURGERY  44yrs ago  . SINUS ENDO W/FUSION Bilateral 02/03/2013   Procedure: ENDOSCOPIC SINUS SURGERY WITH FUSION NAVIGATION;  Surgeon: Ruby Cola, MD;  Location: [facility];  Service: ENT;  Laterality: Bilateral;  Repaired CSF Leak  . US ECHOCARDIOGRAPHY    . VAGINAL HYSTERECTOMY N/A 10/10/2015   Procedure: HYSTERECTOMY VAGINAL;  Surgeon: Servando Salina, MD;  Location: [facility];  Service: Gynecology;  Laterality: N/A;  . VAGINAL PROLAPSE REPAIR N/A 10/10/2015   Procedure: VAULT PROLAPSE AND GRAFT;  Surgeon: Bjorn Loser, MD;  Location: [facility];  Service: Urology;  Laterality: N/A;    Family History  Problem Relation Age of Onset  . Cancer - Prostate Father   . Hypertension Father   . Hypertension Mother   . Diabetes Mother   . Heart attack Neg Hx   . Hyperlipidemia Neg Hx   . Sudden death Neg Hx     Social history:  reports that she has never smoked. She has never used smokeless tobacco. She reports that she does not drink alcohol or use drugs.    Allergies  Allergen Reactions  . Codeine Nausea And Vomiting  . Hydrocodone Itching    Medications:  Prior  to Admission medications   Medication Sig Start Date End Date Taking? Authorizing Provider  acetaZOLAMIDE (DIAMOX) 500 MG capsule Take 1 capsule (500 mg total) by mouth 2 (two) times daily. 02/28/18  Yes Kathrynn Ducking, MD  benazepril-hydrochlorthiazide (LOTENSIN HCT) 20-25 MG per tablet Take 0.5-1 tablets by mouth 2 (two) times daily. Patient takes 1 tablet in the morning and 0.5 in the evening.   Yes [provider]  cetirizine (ZYRTEC) 10 MG tablet Take 10 mg by mouth daily.    Yes [provider]  cyclobenzaprine (FLEXERIL) 10 MG tablet Take 10 mg by mouth daily as needed for muscle spasms.   Yes [provider]  famotidine (PEPCID) 20 MG tablet Take 20 mg by mouth 2 (two) times  daily.    Yes [provider]  fluticasone (FLONASE) 50 MCG/ACT nasal spray Place 2 sprays into both nostrils daily. 08/30/17  Yes Yu, Amy V, PA-C  ipratropium (ATROVENT) 0.06 % nasal spray Place 2 sprays into both nostrils 4 (four) times daily. 08/30/17  Yes Yu, Amy V, PA-C  Magnesium Oxide 400 (240 Mg) MG TABS Take 400 mg by mouth 2 (two) times daily. 11/23/16  Yes [provider]  ondansetron (ZOFRAN) 4 MG tablet Take 1 tablet (4 mg total) by mouth every 8 (eight) hours as needed for nausea. 10/12/15  Yes Cousins, Alanda Slim, MD  potassium chloride SA (K-DUR,KLOR-CON) 20 MEQ tablet Take 20 mEq by mouth 2 (two) times daily.   Yes [provider]  sennosides-docusate sodium (SENOKOT-S) 8.6-50 MG tablet Take 2 tablets by mouth as needed.    Yes [provider]  Vitamin D, Ergocalciferol, (DRISDOL) 50000 units CAPS capsule Take 50,000 Units by mouth every 7 (seven) days. Every Sunday.   Yes [provider]    ROS:  Out of a complete 14 system review of symptoms, the patient complains only of the following symptoms, and all other reviewed systems are negative.  Headache  Blood pressure 117/71, pulse 80, height 5\' 5"  (1.651 m), weight 256 lb (116.1 kg), last menstrual period 12/02/2014.  Physical Exam  General: The patient is alert and cooperative at the time of the examination.  The patient is markedly obese.  Skin: No significant peripheral edema is noted.   Neurologic Exam  Mental status: The patient is alert and oriented x 3 at the time of the examination. The patient has apparent normal recent and remote memory, with an apparently normal attention span and concentration ability.   Cranial nerves: Facial symmetry is present. Speech is normal, no aphasia or dysarthria is noted. Extraocular movements are full. Visual fields are full.  Pupils are equal, round, and react to light.  Discs are flat bilaterally, no venous pulsations are seen.  Motor:  The patient has good strength in all 4 extremities.  Sensory examination: Soft touch sensation is symmetric on the face, arms, and legs.  Coordination: The patient has good finger-nose-finger and heel-to-shin bilaterally.  Gait and station: The patient has a normal gait. Tandem gait is normal. Romberg is negative. No drift is seen.  Reflexes: Deep tendon reflexes are symmetric.   Assessment/Plan:  1.  Probable migraine headache  2.  Sleep apnea on CPAP  The patient will be placed on Effexor for the headache in combination with the Diamox. In the past, she did not tolerate nortriptyline well.  The patient was given Maxalt to take if needed for the headache.  She will follow-up in 6 months, she will call for any  dose adjustments of the Effexor.   Jill Alexanders MD 06/13/2018 4:19 PM  [facility] [address] [place], [place] [address]  Phone [phone] Fax [phone]

## 2018-06-13 NOTE — Patient Instructions (Signed)
We will start Effexor in low dose for the headache, call for any dose adjustments.  Use Maxalt for the headache if needed.

## 2018-06-21 ENCOUNTER — Ambulatory Visit: Payer: 59

## 2018-06-21 ENCOUNTER — Ambulatory Visit
Admission: RE | Admit: 2018-06-21 | Discharge: 2018-06-21 | Disposition: A | Discharge status: Home / Self Care | Payer: 59 | Source: Ambulatory Visit | Attending: Obstetrics and Gynecology | Admitting: Obstetrics and Gynecology

## 2018-06-21 DIAGNOSIS — R928 Other abnormal and inconclusive findings on diagnostic imaging of breast: Secondary | ICD-10-CM | POA: Diagnosis not present

## 2018-07-15 MED FILL — FAMOTIDINE 40 MG TABS: 40 | 90 days supply | Qty: 45 | Fill #0

## 2018-07-15 MED FILL — BENAZEPRIL-HCTZ 20-25 MG TA: 20-25 | 90 days supply | Qty: 135 | Fill #2

## 2018-08-25 DIAGNOSIS — I10 Essential (primary) hypertension: Secondary | ICD-10-CM | POA: Diagnosis not present

## 2018-08-25 DIAGNOSIS — M609 Myositis, unspecified: Secondary | ICD-10-CM | POA: Diagnosis not present

## 2018-08-25 DIAGNOSIS — E8881 Metabolic syndrome: Secondary | ICD-10-CM | POA: Diagnosis not present

## 2018-08-25 DIAGNOSIS — G4733 Obstructive sleep apnea (adult) (pediatric): Secondary | ICD-10-CM | POA: Diagnosis not present

## 2018-08-30 DIAGNOSIS — G4730 Sleep apnea, unspecified: Secondary | ICD-10-CM | POA: Diagnosis not present

## 2018-08-30 DIAGNOSIS — G932 Benign intracranial hypertension: Secondary | ICD-10-CM | POA: Diagnosis not present

## 2018-08-30 DIAGNOSIS — M609 Myositis, unspecified: Secondary | ICD-10-CM | POA: Diagnosis not present

## 2018-08-30 DIAGNOSIS — I10 Essential (primary) hypertension: Secondary | ICD-10-CM | POA: Diagnosis not present

## 2018-09-19 MED FILL — VENLAFAXINE HCL ER 37.5 MG: 37.5 | 30 days supply | Qty: 30 | Fill #1

## 2018-09-19 MED FILL — ONDANSETRON HCL 4 MG TABLET: 4 | 20 days supply | Qty: 60 | Fill #0

## 2018-09-19 MED FILL — FAMOTIDINE 20 MG TABLET: 20 | 90 days supply | Qty: 90 | Fill #2

## 2018-09-19 MED FILL — CYCLOBENZAPRINE HCL 10 MG T: 10 | 60 days supply | Qty: 120 | Fill #0

## 2018-09-19 MED FILL — VITAMIN D3 50000 UNIT CAPS: 1.25 MG | 84 days supply | Qty: 12 | Fill #2

## 2018-09-19 MED FILL — ACETAZOLAMIDE ER 500 MG CAP: 500 | 90 days supply | Qty: 180 | Fill #1

## 2018-09-23 MED FILL — BENAZEPRIL-HCTZ 20-25 MG TA: 20-25 | 90 days supply | Qty: 135 | Fill #3

## 2018-10-03 MED FILL — IPRATROPIUM 0.06% SPRAY: 0.06 | 10 days supply | Qty: 15 | Fill #0

## 2018-12-08 ENCOUNTER — Telehealth: Payer: Self-pay | Admitting: *Deleted

## 2018-12-08 NOTE — Telephone Encounter (Signed)
Due to current COVID 19 pandemic, our office is severely reducing in office visits until further notice, in order to minimize the risk to our patients and healthcare providers. Unable to get in contact with the patient to convert their office visit with Judson Roch on 12/13/2018 into a doxy.me visit. I left a voicemail asking the patient to return my call. Office number was provided.     If patient calls back please convert their office visit into a doxy.me visit.

## 2018-12-12 NOTE — Telephone Encounter (Signed)
Spoke to pt. She consented to doxy.me visit.   Pt understands that although there may be some limitations with this type of visit, we will take all precautions to reduce any security or privacy concerns.  Pt understands that this will be treated like an in office visit and we will file with pt's insurance, and there may be a patient responsible charge related to this service. email sent [email or web address].

## 2018-12-13 ENCOUNTER — Encounter: Payer: Self-pay | Admitting: Neurology

## 2018-12-13 ENCOUNTER — Other Ambulatory Visit: Payer: Self-pay

## 2018-12-13 ENCOUNTER — Ambulatory Visit (INDEPENDENT_AMBULATORY_CARE_PROVIDER_SITE_OTHER): Payer: 59 | Admitting: Neurology

## 2018-12-13 DIAGNOSIS — G43019 Migraine without aura, intractable, without status migrainosus: Secondary | ICD-10-CM | POA: Diagnosis not present

## 2018-12-13 MED ORDER — ACETAZOLAMIDE ER 500 MG PO CP12: 500.0000 mg | ORAL_CAPSULE | Freq: Two times a day (BID) | ORAL | 3 refills | Status: DC

## 2018-12-13 MED ORDER — VENLAFAXINE HCL ER 75 MG PO CP24: 75.0000 mg | ORAL_CAPSULE | Freq: Every day | ORAL | 1 refills | Status: DC

## 2018-12-13 MED FILL — VENLAFAXINE HCL ER 75 MG CA: 75 | 90 days supply | Qty: 90 | Fill #0

## 2018-12-13 MED FILL — ACETAZOLAMIDE ER 500 MG CAP: 500 | 90 days supply | Qty: 180 | Fill #0

## 2018-12-13 NOTE — Progress Notes (Signed)
Virtual Visit via Video Note  I connected with [name] on 12/13/18 at  3:45 PM EDT by a video enabled telemedicine application and verified that I am speaking with the correct person using two identifiers.  Location: Patient: At her home  Provider: In the office    I discussed the limitations of evaluation and management by telemedicine and the availability of in person appointments. The patient expressed understanding and agreed to proceed.  History of Present Illness: 12/13/2018: [name] is a 53 year old female with history of obesity, possible pseudotumor cerebri.  Her eye doctor has found that her papilledema has disappeared.  She continues taking Diamox 500 mg twice daily.  She has continued to complain of headaches, at last visit was started on Effexor.  She has only been taking Effexor for about 2 months.  She reports a reduction in her headaches with the medication.  She says she is having between 8-10 mild headaches a month.  She will only have 1 bad migraine per month.  She has taken the Maxalt twice, has been on this be beneficial.  She indicates that with her bad headaches, she will have photophobia and nausea.  She is only had to call out of work 1 day.  She is currently a Marine scientist.  Over the last year she has lost about 40 pounds.  She follows with her eye doctor to evaluate papilledema, and is due for evaluation.  She follows with her primary care doctor regularly, they check routine lab work.  In the past for her headaches she says she has tried Topamax, and nortriptyline.  06/13/2018 Dr. Jannifer Franklin: [name] is a 53 year old right-handed black female with a history of obesity, she has been felt to have pseudotumor cerebri, she has been on Diamox taking 500 mg twice daily.  Her optometrist has found that her papilledema has disappeared, however.  The patient continues to have headaches on average twice a week, she may have nausea with the headache, she may occasionally miss  work because of the headache.  The patient likely has underlying migraine.  She has sleep apnea on CPAP, if she does not use her CPAP she may have increased reconceive headache.  The patient is tolerating the Diamox fairly well.  She does believe that this has helped the frequency of her headaches.  She returns for an evaluation.   Observations/Objective: Very pleasant, very knowledgeable, is alert and oriented, answers questions appropriately, follows commands, facial symmetry noted, symmetric eye movements, no arm drift, gait is intact  116/82, HR 78, RR 20, 99% RA, Weight 243 lbs, 5'5 FT   Assessment and Plan: 1.  Pseudotumor cerebri 2.  Migraine headache  She will continue taking Diamox 500 mg twice daily, Effexor 75 mg daily.  She has reported benefit with the addition of Effexor.  She continues to have about 8-10 headaches per month, but they are mild.  She says she will take Tylenol with benefit.  She is having about 1 bad headache per month.  She has found Maxalt to be beneficial.  She will keep a migraine journal, try to identify triggers or patterns.  She will follow-up with her eye doctor to evaluate for papilledema.  She does have migraine features, photophobia, nausea.  In the past she is tried Topamax, nortriptyline.  In the future we may try a CGRP to see if this is beneficial for her headaches.  She has had routine lab work with her primary care, is due for  lab work soon.  She should continue her exercise program. She continues to use CPAP for OSA, this has been helpful for her headaches as well. I will provide refills of Diamox and Effexor.   Follow Up Instructions: 6 months 06/20/2019 11:15 am   I discussed the assessment and treatment plan with the patient. The patient was provided an opportunity to ask questions and all were answered. The patient agreed with the plan and demonstrated an understanding of the instructions.   The patient was advised to call back or seek an  in-person evaluation if the symptoms worsen or if the condition fails to improve as anticipated.  I provided 15 minutes of non-face-to-face time during this encounter.  Evangeline Dakin, DNP  [facility] [address] [place], [place] [address] [phone]

## 2018-12-13 NOTE — Progress Notes (Signed)
I have read the note, and I agree with the clinical assessment and plan.  [name]   

## 2018-12-16 DIAGNOSIS — R7303 Prediabetes: Secondary | ICD-10-CM | POA: Diagnosis not present

## 2018-12-16 DIAGNOSIS — G4733 Obstructive sleep apnea (adult) (pediatric): Secondary | ICD-10-CM | POA: Diagnosis not present

## 2018-12-16 DIAGNOSIS — G43009 Migraine without aura, not intractable, without status migrainosus: Secondary | ICD-10-CM | POA: Diagnosis not present

## 2018-12-16 DIAGNOSIS — J4520 Mild intermittent asthma, uncomplicated: Secondary | ICD-10-CM | POA: Diagnosis not present

## 2018-12-16 DIAGNOSIS — K219 Gastro-esophageal reflux disease without esophagitis: Secondary | ICD-10-CM | POA: Diagnosis not present

## 2018-12-16 DIAGNOSIS — I10 Essential (primary) hypertension: Secondary | ICD-10-CM | POA: Diagnosis not present

## 2018-12-16 DIAGNOSIS — Z6841 Body Mass Index (BMI) 40.0 and over, adult: Secondary | ICD-10-CM | POA: Diagnosis not present

## 2018-12-16 DIAGNOSIS — E559 Vitamin D deficiency, unspecified: Secondary | ICD-10-CM | POA: Diagnosis not present

## 2018-12-16 DIAGNOSIS — L210 Seborrhea capitis: Secondary | ICD-10-CM | POA: Diagnosis not present

## 2018-12-19 ENCOUNTER — Telehealth: Payer: Self-pay | Admitting: Neurology

## 2018-12-19 NOTE — Telephone Encounter (Signed)
I received lab work from her primary care doctor as follows, all WNL 08/25/2018:  Glucose 99 Creatinine 0.69 Sodium 140 Potassium 4.4 AST 19 ALT 16 Total cholesterol 146 HDL 59 (>39) LDL 74 A1c 5.7

## 2019-01-13 MED FILL — FLUOCINOLONE ACETONIDE BODY: 0.01 | 30 days supply | Qty: 118 | Fill #0

## 2019-01-13 MED FILL — CLOBETASOL 0.05% TOPICAL LO: 0.05 | 30 days supply | Qty: 59 | Fill #0

## 2019-01-20 MED FILL — BENAZEPRIL-HYDROCHLOROTHIAZ: 20-25 | 90 days supply | Qty: 135 | Fill #0

## 2019-01-20 MED FILL — FAMOTIDINE 40 MG TABLET: 40 | 30 days supply | Qty: 15 | Fill #0

## 2019-01-31 DIAGNOSIS — G4733 Obstructive sleep apnea (adult) (pediatric): Secondary | ICD-10-CM | POA: Diagnosis not present

## 2019-02-02 DIAGNOSIS — H6504 Acute serous otitis media, recurrent, right ear: Secondary | ICD-10-CM | POA: Diagnosis not present

## 2019-02-02 DIAGNOSIS — J0121 Acute recurrent ethmoidal sinusitis: Secondary | ICD-10-CM | POA: Diagnosis not present

## 2019-02-02 DIAGNOSIS — K219 Gastro-esophageal reflux disease without esophagitis: Secondary | ICD-10-CM | POA: Diagnosis not present

## 2019-02-02 DIAGNOSIS — Z6841 Body Mass Index (BMI) 40.0 and over, adult: Secondary | ICD-10-CM | POA: Diagnosis not present

## 2019-02-02 DIAGNOSIS — Z2089 Contact with and (suspected) exposure to other communicable diseases: Secondary | ICD-10-CM | POA: Diagnosis not present

## 2019-02-02 DIAGNOSIS — R05 Cough: Secondary | ICD-10-CM | POA: Diagnosis not present

## 2019-02-02 DIAGNOSIS — I10 Essential (primary) hypertension: Secondary | ICD-10-CM | POA: Diagnosis not present

## 2019-02-02 DIAGNOSIS — J3089 Other allergic rhinitis: Secondary | ICD-10-CM | POA: Diagnosis not present

## 2019-02-02 DIAGNOSIS — R7303 Prediabetes: Secondary | ICD-10-CM | POA: Diagnosis not present

## 2019-02-02 DIAGNOSIS — J4520 Mild intermittent asthma, uncomplicated: Secondary | ICD-10-CM | POA: Diagnosis not present

## 2019-02-02 MED FILL — AMOX-CLAV 500-125 MG TABLET: 500-125 | 10 days supply | Qty: 20 | Fill #0

## 2019-02-02 MED FILL — BENZONATATE 200 MG CAP: 200 | 10 days supply | Qty: 30 | Fill #0

## 2019-02-15 DIAGNOSIS — I10 Essential (primary) hypertension: Secondary | ICD-10-CM | POA: Diagnosis not present

## 2019-02-15 DIAGNOSIS — Z6841 Body Mass Index (BMI) 40.0 and over, adult: Secondary | ICD-10-CM | POA: Diagnosis not present

## 2019-02-15 DIAGNOSIS — K219 Gastro-esophageal reflux disease without esophagitis: Secondary | ICD-10-CM | POA: Diagnosis not present

## 2019-02-15 DIAGNOSIS — J0121 Acute recurrent ethmoidal sinusitis: Secondary | ICD-10-CM | POA: Diagnosis not present

## 2019-02-15 DIAGNOSIS — J4520 Mild intermittent asthma, uncomplicated: Secondary | ICD-10-CM | POA: Diagnosis not present

## 2019-02-15 DIAGNOSIS — R7303 Prediabetes: Secondary | ICD-10-CM | POA: Diagnosis not present

## 2019-02-15 DIAGNOSIS — G4733 Obstructive sleep apnea (adult) (pediatric): Secondary | ICD-10-CM | POA: Diagnosis not present

## 2019-02-15 DIAGNOSIS — E559 Vitamin D deficiency, unspecified: Secondary | ICD-10-CM | POA: Diagnosis not present

## 2019-03-20 ENCOUNTER — Telehealth: Payer: Self-pay

## 2019-03-20 ENCOUNTER — Ambulatory Visit: Payer: 59 | Admitting: Adult Health

## 2019-03-20 NOTE — Telephone Encounter (Signed)
Patient was a no call/no show for their appointment today.   

## 2019-03-21 ENCOUNTER — Encounter: Payer: Self-pay | Admitting: Adult Health

## 2019-03-24 MED FILL — VENLAFAXINE HCL ER 75 MG CA: 75 | 90 days supply | Qty: 90 | Fill #1

## 2019-03-24 MED FILL — VITAMIN D3 50000 UNIT CAPS: 1.25 MG | 84 days supply | Qty: 12 | Fill #0

## 2019-03-24 MED FILL — ACETAZOLAMIDE ER 500 MG CAP: 500 | 90 days supply | Qty: 180 | Fill #1

## 2019-03-27 ENCOUNTER — Other Ambulatory Visit: Payer: Self-pay | Admitting: Podiatry

## 2019-03-27 MED FILL — IPRATROPIUM 0.06% SPRAY: 0.06 | 21 days supply | Qty: 15 | Fill #0

## 2019-04-05 MED FILL — AMOX-CLAV 500-125 MG TABLET: 500-125 | 10 days supply | Qty: 20 | Fill #0

## 2019-04-10 DIAGNOSIS — I10 Essential (primary) hypertension: Secondary | ICD-10-CM | POA: Diagnosis not present

## 2019-04-10 DIAGNOSIS — J4520 Mild intermittent asthma, uncomplicated: Secondary | ICD-10-CM | POA: Diagnosis not present

## 2019-04-10 DIAGNOSIS — R7303 Prediabetes: Secondary | ICD-10-CM | POA: Diagnosis not present

## 2019-04-10 DIAGNOSIS — K219 Gastro-esophageal reflux disease without esophagitis: Secondary | ICD-10-CM | POA: Diagnosis not present

## 2019-04-10 DIAGNOSIS — G4733 Obstructive sleep apnea (adult) (pediatric): Secondary | ICD-10-CM | POA: Diagnosis not present

## 2019-04-10 DIAGNOSIS — J45991 Cough variant asthma: Secondary | ICD-10-CM | POA: Diagnosis not present

## 2019-04-10 DIAGNOSIS — E559 Vitamin D deficiency, unspecified: Secondary | ICD-10-CM | POA: Diagnosis not present

## 2019-04-10 DIAGNOSIS — J029 Acute pharyngitis, unspecified: Secondary | ICD-10-CM | POA: Diagnosis not present

## 2019-04-10 MED FILL — MONTELUKAST SOD 10 MG TAB: 10 | 90 days supply | Qty: 90 | Fill #0

## 2019-04-10 MED FILL — BENAZEPRIL-HYDROCHLOROTHIAZ: 20-25 | 90 days supply | Qty: 135 | Fill #1

## 2019-04-11 ENCOUNTER — Other Ambulatory Visit: Payer: Self-pay

## 2019-04-11 ENCOUNTER — Ambulatory Visit
Admission: RE | Admit: 2019-04-11 | Discharge: 2019-04-11 | Disposition: A | Discharge status: Home / Self Care | Payer: 59 | Source: Ambulatory Visit | Attending: Internal Medicine | Admitting: Internal Medicine

## 2019-04-11 ENCOUNTER — Other Ambulatory Visit: Payer: Self-pay | Admitting: Internal Medicine

## 2019-04-11 DIAGNOSIS — R0789 Other chest pain: Secondary | ICD-10-CM | POA: Diagnosis not present

## 2019-04-11 DIAGNOSIS — R053 Chronic cough: Secondary | ICD-10-CM

## 2019-04-11 DIAGNOSIS — R05 Cough: Secondary | ICD-10-CM | POA: Diagnosis not present

## 2019-04-11 DIAGNOSIS — Z20822 Contact with and (suspected) exposure to covid-19: Secondary | ICD-10-CM

## 2019-04-11 DIAGNOSIS — Z20828 Contact with and (suspected) exposure to other viral communicable diseases: Secondary | ICD-10-CM | POA: Diagnosis not present

## 2019-04-13 LAB — NOVEL CORONAVIRUS, NAA: SARS-CoV-2, NAA: NOT DETECTED

## 2019-04-27 MED FILL — BENZONATATE 200 MG CAP: 200 | 10 days supply | Qty: 30 | Fill #1

## 2019-04-27 MED FILL — IPRATROPIUM 0.06% SPRAY: 0.06 | 21 days supply | Qty: 15 | Fill #1

## 2019-05-02 DIAGNOSIS — G4733 Obstructive sleep apnea (adult) (pediatric): Secondary | ICD-10-CM | POA: Diagnosis not present

## 2019-05-29 DIAGNOSIS — G4733 Obstructive sleep apnea (adult) (pediatric): Secondary | ICD-10-CM | POA: Diagnosis not present

## 2019-05-29 DIAGNOSIS — M169 Osteoarthritis of hip, unspecified: Secondary | ICD-10-CM | POA: Diagnosis not present

## 2019-05-29 DIAGNOSIS — K219 Gastro-esophageal reflux disease without esophagitis: Secondary | ICD-10-CM | POA: Diagnosis not present

## 2019-05-29 DIAGNOSIS — I10 Essential (primary) hypertension: Secondary | ICD-10-CM | POA: Diagnosis not present

## 2019-05-29 DIAGNOSIS — G932 Benign intracranial hypertension: Secondary | ICD-10-CM | POA: Diagnosis not present

## 2019-05-29 DIAGNOSIS — G4700 Insomnia, unspecified: Secondary | ICD-10-CM | POA: Diagnosis not present

## 2019-05-29 DIAGNOSIS — J3089 Other allergic rhinitis: Secondary | ICD-10-CM | POA: Diagnosis not present

## 2019-05-29 DIAGNOSIS — J45991 Cough variant asthma: Secondary | ICD-10-CM | POA: Diagnosis not present

## 2019-05-29 MED FILL — QUETIAPINE 25 MG TABLET: 25 | 30 days supply | Qty: 30 | Fill #0

## 2019-05-29 MED FILL — DICLOFENAC POT 50 MG TABLET: 50 | 30 days supply | Qty: 60 | Fill #0

## 2019-06-05 MED FILL — FLOVENT HFA 110 MCG INHALER: 110 | 30 days supply | Qty: 12 | Fill #0

## 2019-06-05 MED FILL — AMOX-CLAV 500-125 MG TABLET: 500-125 | 10 days supply | Qty: 20 | Fill #1

## 2019-06-20 ENCOUNTER — Ambulatory Visit: Payer: 59 | Admitting: Neurology

## 2019-06-28 MED FILL — MAGNESIUM OXIDE 400 MG TAB: 400 (240 MG | 90 days supply | Qty: 180 | Fill #0

## 2019-06-28 MED FILL — QUETIAPINE 25 MG TABLET: 25 | 30 days supply | Qty: 30 | Fill #1

## 2019-06-28 MED FILL — DICLOFENAC POT 50 MG TABLET: 50 | 30 days supply | Qty: 60 | Fill #1

## 2019-06-29 MED FILL — IPRATROPIUM 0.06% SPRAY: 0.06 | 21 days supply | Qty: 15 | Fill #2

## 2019-06-29 MED FILL — VITAMIN D3 50,000 UNITS CAP: 1.25 MG | 84 days supply | Qty: 12 | Fill #1

## 2019-07-10 ENCOUNTER — Telehealth: Payer: Self-pay

## 2019-07-10 NOTE — Telephone Encounter (Signed)
Unable to get in contact with the patient. LVM letting her know that her appt will need to be converted into a mychart video visit. Office number was provided.

## 2019-07-11 ENCOUNTER — Telehealth (INDEPENDENT_AMBULATORY_CARE_PROVIDER_SITE_OTHER): Payer: 59 | Admitting: Neurology

## 2019-07-11 ENCOUNTER — Encounter: Payer: Self-pay | Admitting: Neurology

## 2019-07-11 DIAGNOSIS — G43019 Migraine without aura, intractable, without status migrainosus: Secondary | ICD-10-CM | POA: Diagnosis not present

## 2019-07-11 MED ORDER — RIZATRIPTAN BENZOATE 10 MG PO TABS: 10.0000 mg | ORAL_TABLET | Freq: Three times a day (TID) | ORAL | 3 refills | Status: DC | PRN

## 2019-07-11 MED ORDER — VENLAFAXINE HCL ER 75 MG PO CP24: 75.0000 mg | ORAL_CAPSULE | Freq: Every day | ORAL | 3 refills | Status: DC

## 2019-07-11 MED FILL — VENLAFAXINE HCL ER 75 MG CA: 75 | 90 days supply | Qty: 90 | Fill #0

## 2019-07-11 MED FILL — RIZATRIPTAN BENZOATE 10 MG: 10 | 30 days supply | Qty: 10 | Fill #0

## 2019-07-11 NOTE — Progress Notes (Signed)
I have read the note, and I agree with the clinical assessment and plan.  Lezette Kitts K Breaunna Gottlieb   

## 2019-07-11 NOTE — Progress Notes (Signed)
Virtual Visit via Video Note  I connected with [name] on 07/11/19 at  2:45 PM EST by a video enabled telemedicine application and verified that I am speaking with the correct person using two identifiers.  Location: Patient: at her home Provider: in the office    I discussed the limitations of evaluation and management by telemedicine and the availability of in person appointments. The patient expressed understanding and agreed to proceed.  History of Present Illness: 07/11/2019 SS: [name] is a 54 year old female with history of obesity, possible pseudotumor cerebri.  She remains on Diamox and Effexor.  She reported previously that her eye doctor has found that her papilledema had disappeared.  She reports she is due for a repeat eye visit in the next several months.  She indicates her headaches have been doing very well, this is the best she has felt in a long time.  She reports on average she may have 2 migraines a month.  She says she is not having to take Maxalt as often.  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  She finds Maxalt to be beneficial, along with Zofran, but afterwards she feels drowsy and needs to lie down.  She remains on CPAP.  She presents today for evaluation via virtual visit.  12/13/2018: [name] is a 54 year old female with history of obesity, possible pseudotumor cerebri.  Her eye doctor has found that her papilledema has disappeared.  She continues taking Diamox 500 mg twice daily.  She has continued to complain of headaches, at last visit was started on Effexor.  She has only been taking Effexor for about 2 months.  She reports a reduction in her headaches with the medication.  She says she is having between 8-10 mild headaches a month.  She will only have 1 bad migraine per month.  She has taken the Maxalt twice, has been on this be beneficial.  She indicates that with her bad headaches, she will have photophobia  and nausea.  She is only had to call out of work 1 day.  She is currently a Marine scientist.  Over the last year she has lost about 40 pounds.  She follows with her eye doctor to evaluate papilledema, and is due for evaluation.  She follows with her primary care doctor regularly, they check routine lab work.  In the past for her headaches she says she has tried Topamax, and nortriptyline.   Observations/Objective: Reported vital signs temperature 97.5, BP 123/82, heart rate 95, respirations 18, O2 sats 97%, weight 246.5 pounds  Via virtual visit, she is alert and oriented, speech is clear and concise, answers questions appropriately, facial symmetry noted, no arm drift, gait appears intact  Assessment and Plan: 1. Chronic migraine headaches 2. Possible pseudotumor cerebri   She continues to do very well, she says this is the best she has done in a while.  On average she reports 2 headaches a month.  She will continue taking Effexor XR 75 mg at bedtime, Diamox 500 mg twice a day, Maxalt as needed.  She will follow-up with her eye doctor in the next several months for yearly eye evaluation.  She has FMLA papers for migraine headaches, that are completed by her primary doctor.  In the future we may assist with these papers.  She does work as a Marine scientist.  Follow Up Instructions: 8 months 03/13/2020 10:15   I discussed the  assessment and treatment plan with the patient. The patient was provided an opportunity to ask questions and all were answered. The patient agreed with the plan and demonstrated an understanding of the instructions.   The patient was advised to call back or seek an in-person evaluation if the symptoms worsen or if the condition fails to improve as anticipated.  I provided 15 minutes of non-face-to-face time during this encounter.  Evangeline Dakin, DNP  [facility] [address] [place], [place] [address] [phone]

## 2019-07-17 MED FILL — BENAZEPRIL-HYDROCHLOROTHIAZ: 20-25 | 90 days supply | Qty: 135 | Fill #2

## 2019-07-17 MED FILL — MONTELUKAST SOD 10 MG TAB: 10 | 90 days supply | Qty: 90 | Fill #1

## 2019-07-17 MED FILL — ACETAZOLAMIDE ER 500 MG CAP: 500 | 90 days supply | Qty: 180 | Fill #2

## 2019-07-17 MED FILL — POTASSIUM CHLORIDE CRYS ER: 20 | 90 days supply | Qty: 90 | Fill #0

## 2019-07-24 MED FILL — QUETIAPINE 25 MG TABLET: 25 | 30 days supply | Qty: 30 | Fill #0

## 2019-07-25 DIAGNOSIS — G4733 Obstructive sleep apnea (adult) (pediatric): Secondary | ICD-10-CM | POA: Diagnosis not present

## 2019-07-27 DIAGNOSIS — E785 Hyperlipidemia, unspecified: Secondary | ICD-10-CM | POA: Diagnosis not present

## 2019-07-27 DIAGNOSIS — I208 Other forms of angina pectoris: Secondary | ICD-10-CM | POA: Diagnosis not present

## 2019-07-27 DIAGNOSIS — I10 Essential (primary) hypertension: Secondary | ICD-10-CM | POA: Diagnosis not present

## 2019-07-27 DIAGNOSIS — G932 Benign intracranial hypertension: Secondary | ICD-10-CM | POA: Diagnosis not present

## 2019-07-27 DIAGNOSIS — G43909 Migraine, unspecified, not intractable, without status migrainosus: Secondary | ICD-10-CM | POA: Diagnosis not present

## 2019-07-27 MED FILL — NITROGLYCERIN 0.4 MG TAB SL: 0.4 | 8 days supply | Qty: 25 | Fill #0

## 2019-08-07 DIAGNOSIS — G932 Benign intracranial hypertension: Secondary | ICD-10-CM | POA: Diagnosis not present

## 2019-08-07 DIAGNOSIS — G4733 Obstructive sleep apnea (adult) (pediatric): Secondary | ICD-10-CM | POA: Diagnosis not present

## 2019-08-07 DIAGNOSIS — G4700 Insomnia, unspecified: Secondary | ICD-10-CM | POA: Diagnosis not present

## 2019-08-07 DIAGNOSIS — Z13220 Encounter for screening for lipoid disorders: Secondary | ICD-10-CM | POA: Diagnosis not present

## 2019-08-07 DIAGNOSIS — J45991 Cough variant asthma: Secondary | ICD-10-CM | POA: Diagnosis not present

## 2019-08-07 DIAGNOSIS — G43009 Migraine without aura, not intractable, without status migrainosus: Secondary | ICD-10-CM | POA: Diagnosis not present

## 2019-08-07 DIAGNOSIS — I10 Essential (primary) hypertension: Secondary | ICD-10-CM | POA: Diagnosis not present

## 2019-08-07 DIAGNOSIS — K219 Gastro-esophageal reflux disease without esophagitis: Secondary | ICD-10-CM | POA: Diagnosis not present

## 2019-08-07 DIAGNOSIS — H93A9 Pulsatile tinnitus, unspecified ear: Secondary | ICD-10-CM | POA: Diagnosis not present

## 2019-08-07 DIAGNOSIS — R7303 Prediabetes: Secondary | ICD-10-CM | POA: Diagnosis not present

## 2019-08-07 DIAGNOSIS — Z6841 Body Mass Index (BMI) 40.0 and over, adult: Secondary | ICD-10-CM | POA: Diagnosis not present

## 2019-08-10 ENCOUNTER — Ambulatory Visit: Payer: 59 | Admitting: Adult Health

## 2019-09-12 ENCOUNTER — Encounter: Payer: Self-pay | Admitting: Adult Health

## 2019-09-14 ENCOUNTER — Ambulatory Visit: Payer: 59 | Admitting: Adult Health

## 2019-09-14 ENCOUNTER — Other Ambulatory Visit: Payer: Self-pay

## 2019-09-14 VITALS — BP 102/69 | HR 73 | Temp 97.1°F | Ht 65.0 in | Wt 256.4 lb

## 2019-09-14 DIAGNOSIS — Z9989 Dependence on other enabling machines and devices: Secondary | ICD-10-CM

## 2019-09-14 DIAGNOSIS — G4733 Obstructive sleep apnea (adult) (pediatric): Secondary | ICD-10-CM

## 2019-09-14 NOTE — Patient Instructions (Signed)
Continue using CPAP nightly and greater than 4 hours each night °If your symptoms worsen or you develop new symptoms please let us know.  ° °

## 2019-09-14 NOTE — Progress Notes (Addendum)
PATIENT: [name] DOB: April 22, 1966  REASON FOR VISIT: follow up HISTORY FROM: patient  HISTORY OF PRESENT ILLNESS: Today 09/14/19:  [name] is a 54 year old female with a history of obstructive sleep apnea on CPAP.  Her download indicates that she used her machine 28 out of 30 days for compliance of 93%.  She used her machine greater than 4 hours 13 days for compliance of 43%.  On average she uses her machine 3 hours and 53 minutes.  Residual AHI is 0.5 on 7 cm of water with EPR 2.  Leak in the 95th percentile is 33.4 L/min.  She reports that most nights she ends up taking her mask off because it is too hot.  She has turn off the heated tubing feature she has not messed with the humidity.  HISTORY 03/15/2018: I reviewed her CPAP compliance data from 02/12/2018 through 03/13/2018 which is a total of 30 days, during which time she used her CPAP only 21 days with percent used days greater than 4 hours at 17%, indicating significantly suboptimal compliance, her compliance overall was just a little bit better in the past 90 days. She reports she struggles with facial sweating and the FFM tends to slide off, she also has had a tendency to pull the mask off in the middle of the night, without realizing. She does typically feel better when she is able to use her CPAP consistently, her morning headaches in particular tend to be better when she has been on CPAP for most of the night.   REVIEW OF SYSTEMS: Out of a complete 14 system review of symptoms, the patient complains only of the following symptoms, and all other reviewed systems are negative.  FSS ESS  ALLERGIES: Allergies  Allergen Reactions  . Codeine Nausea And Vomiting  . Hydrocodone Itching    HOME MEDICATIONS: Outpatient Medications Prior to Visit  Medication Sig Dispense Refill  . acetaZOLAMIDE (DIAMOX) 500 MG capsule Take 1 capsule (500 mg total) by mouth 2 (two) times daily. 180 capsule 3  .  benazepril-hydrochlorthiazide (LOTENSIN HCT) 20-25 MG per tablet Take 0.5-1 tablets by mouth 2 (two) times daily. Patient takes 1 tablet in the morning and 0.5 in the evening.    . cetirizine (ZYRTEC) 10 MG tablet Take 10 mg by mouth daily.     . cyclobenzaprine (FLEXERIL) 10 MG tablet Take 10 mg by mouth daily as needed for muscle spasms.    . famotidine (PEPCID) 20 MG tablet Take 20 mg by mouth 2 (two) times daily.     . fluticasone (FLONASE) 50 MCG/ACT nasal spray Place 2 sprays into both nostrils daily. 1 g 0  . ipratropium (ATROVENT) 0.06 % nasal spray Place 2 sprays into both nostrils 4 (four) times daily. 15 mL 0  . Magnesium Oxide 400 (240 Mg) MG TABS Take 400 mg by mouth 2 (two) times daily.  2  . ondansetron (ZOFRAN) 4 MG tablet Take 1 tablet (4 mg total) by mouth every 8 (eight) hours as needed for nausea. 20 tablet 0  . potassium chloride SA (K-DUR,KLOR-CON) 20 MEQ tablet Take 20 mEq by mouth 2 (two) times daily.    . rizatriptan (MAXALT) 10 MG tablet Take 1 tablet (10 mg total) by mouth 3 (three) times daily as needed for migraine. 10 tablet 3  . sennosides-docusate sodium (SENOKOT-S) 8.6-50 MG tablet Take 2 tablets by mouth as needed.     . venlafaxine XR (EFFEXOR-XR) 75 MG 24 hr capsule Take 1  capsule (75 mg total) by mouth at bedtime. 90 capsule 3  . Vitamin D, Ergocalciferol, (DRISDOL) 50000 units CAPS capsule Take 50,000 Units by mouth every 7 (seven) days. Every Sunday.     No facility-administered medications prior to visit.    PAST MEDICAL HISTORY: Past Medical History:  Diagnosis Date  . Allergy    takes Zyrtec daily  . Arthritis   . Back pain    arthritis  . Benign intracranial hypertension   . Chronic constipation    takes Stool Softener  . Dizziness   . Eczema   . Family history of anesthesia complication    mother got confused some after anesthesia  . Gastroesophageal reflux disease    takes Pepcid daily  . Headache(784.0)    benign intercranial HTN d/t  CSF leak, migraines  . History of bronchitis    pt states a very long time ago  . History of MRSA infection    > 78yrs ago  . Hypertension    takes Lotensin daily  . Lumbago   . Obesity, unspecified   . Peripheral edema   . Pulsatile tinnitus   . Sleep apnea   . Unspecified vitamin D deficiency     PAST SURGICAL HISTORY: Past Surgical History:  Procedure Laterality Date  . ANTERIOR AND POSTERIOR REPAIR N/A 10/10/2015   Procedure: ANTERIOR (CYSTOCELE) AND POSTERIOR REPAIR (RECTOCELE);  Surgeon: Bjorn Loser, MD;  Location: [facility];  Service: Urology;  Laterality: N/A;  . CARDIOVASCULAR STRESS TEST    . CESAREAN SECTION  60yrs ago  . CHOLECYSTECTOMY    . CYSTO N/A 10/10/2015   Procedure: CYSTO;  Surgeon: Bjorn Loser, MD;  Location: [facility];  Service: Urology;  Laterality: N/A;  . GALLBLADDER SURGERY  60yrs ago  . SINUS ENDO W/FUSION Bilateral 02/03/2013   Procedure: ENDOSCOPIC SINUS SURGERY WITH FUSION NAVIGATION;  Surgeon: Ruby Cola, MD;  Location: [facility];  Service: ENT;  Laterality: Bilateral;  Repaired CSF Leak  . US ECHOCARDIOGRAPHY    . VAGINAL HYSTERECTOMY N/A 10/10/2015   Procedure: HYSTERECTOMY VAGINAL;  Surgeon: Servando Salina, MD;  Location: [facility];  Service: Gynecology;  Laterality: N/A;  . VAGINAL PROLAPSE REPAIR N/A 10/10/2015   Procedure: VAULT PROLAPSE AND GRAFT;  Surgeon: Bjorn Loser, MD;  Location: [facility];  Service: Urology;  Laterality: N/A;    FAMILY HISTORY: Family History  Problem Relation Age of Onset  . Cancer - Prostate Father   . Hypertension Father   . Hypertension Mother   . Diabetes Mother   . Heart attack Neg Hx   . Hyperlipidemia Neg Hx   . Sudden death Neg Hx     SOCIAL HISTORY: Social History   Socioeconomic History  . Marital status: Married    Spouse name: Nicole Kindred  . Number of children: 12  . Years of education: 12+  . Highest education level: Not on file  Occupational History    Employer: [employer]  Tobacco Use  .  Smoking status: Never Smoker  . Smokeless tobacco: Never Used  Substance and Sexual Activity  . Alcohol use: No    Alcohol/week: 0.0 standard drinks  . Drug use: No  . Sexual activity: Yes  Other Topics Concern  . Not on file  Social History Narrative   Patient lives at home with husband and son. Nicole Kindred)   Patient has 2 children.    Patient is currently working as an Therapist, sports.   Patient has a college education.    Patient is right  handed.    Consumes drinks 1 16oz soda 3 times a week, rarely drinks coffee.    Works night shift   Social Determinants of Radio broadcast assistant Strain:   . Difficulty of Paying Living Expenses:   Food Insecurity:   . Worried About Charity fundraiser in the Last Year:   . Arboriculturist in the Last Year:   Transportation Needs:   . Film/video editor (Medical):   Marland Kitchen Lack of Transportation (Non-Medical):   Physical Activity:   . Days of Exercise per Week:   . Minutes of Exercise per Session:   Stress:   . Feeling of Stress :   Social Connections:   . Frequency of Communication with Friends and Family:   . Frequency of Social Gatherings with Friends and Family:   . Attends Religious Services:   . Active Member of Clubs or Organizations:   . Attends Archivist Meetings:   Marland Kitchen Marital Status:   Intimate Partner Violence:   . Fear of Current or Ex-Partner:   . Emotionally Abused:   Marland Kitchen Physically Abused:   . Sexually Abused:       PHYSICAL EXAM  Vitals:   09/14/19 1405  BP: 102/69  Pulse: 73  Temp: (!) 97.1 F (36.2 C)  Weight: 256 lb 6.4 oz (116.3 kg)  Height: 5\' 5"  (1.651 m)   Body mass index is 42.67 kg/m.  Generalized: Well developed, in no acute distress  Chest: Lungs clear to auscultation bilaterally  Neurological examination  Mentation: Alert oriented to time, place, history taking. Follows all commands speech and language fluent Cranial nerve II-XII: Extraocular movements were full, visual field were full on  confrontational test Head turning and shoulder shrug  were normal and symmetric. Motor: The motor testing reveals 5 over 5 strength of all 4 extremities. Good symmetric motor tone is noted throughout.  Sensory: Sensory testing is intact to soft touch on all 4 extremities. No evidence of extinction is noted.  Gait and station: Gait is normal.    DIAGNOSTIC DATA (LABS, IMAGING, TESTING) - I reviewed patient records, labs, notes, testing and imaging myself where available.  Lab Results  Component Value Date   WBC 10.4 10/11/2015   HGB 12.3 10/11/2015   HCT 37.8 10/11/2015   MCV 81.8 10/11/2015   PLT 369 10/11/2015      Component Value Date/Time   NA 134 (L) 10/11/2015 0530   NA 140 12/10/2011 0000   K 3.3 (L) 10/11/2015 0530   CL 103 10/11/2015 0530   CO2 23 10/11/2015 0530   GLUCOSE 107 (H) 10/11/2015 0530   BUN 13 10/11/2015 0530   BUN 11 12/10/2011 0000   CREATININE 0.77 10/11/2015 0530   CALCIUM 8.5 (L) 10/11/2015 0530   PROT 6.4 02/05/2013 0502   ALBUMIN 3.1 (L) 02/05/2013 0502   AST 20 02/05/2013 0502   ALT 12 02/05/2013 0502   ALKPHOS 77 02/05/2013 0502   BILITOT 0.4 02/05/2013 0502   GFRNONAA >60 10/11/2015 0530   GFRAA >60 10/11/2015 0530      ASSESSMENT AND PLAN 54 y.o. year old female  has a past medical history of Allergy, Arthritis, Back pain, Benign intracranial hypertension, Chronic constipation, Dizziness, Eczema, Family history of anesthesia complication, Gastroesophageal reflux disease, Headache(784.0), History of bronchitis, History of MRSA infection, Hypertension, Lumbago, Obesity, unspecified, Peripheral edema, Pulsatile tinnitus, Sleep apnea, and Unspecified vitamin D deficiency. here with:  1. OSA on CPAP  - CPAP compliance suboptimal -  Good treatment of AHI  - Encourage patient to use CPAP nightly and > 4 hours each night -Advised patient to adjust humidity if this does not offer more comfort and we can consider a mask refitting - F/U in 1 year  or sooner if needed   I spent 15 minutes of face-to-face and non-face-to-face time with patient.  This included previsit chart review,  study review, order entry, electronic health record documentation, patient education.  Ward Givens, MSN, NP-C 09/14/2019, 2:53 PM [facility] [address], [place], [place] [address] [phone]  I reviewed the above note and documentation by the Nurse Practitioner and agree with the history, exam, assessment and plan as outlined above. I was available for consultation. Star Age, MD, PhD [facility])

## 2019-09-22 MED FILL — CHLORHEXIDINE 0.12% RINSE: 0.12 | 16 days supply | Qty: 473 | Fill #0

## 2019-09-22 MED FILL — QUETIAPINE 25 MG TABLET: 25 | 30 days supply | Qty: 30 | Fill #1

## 2019-09-22 MED FILL — NAPROXEN SODIUM 550 MG TABS: 550 | 6 days supply | Qty: 12 | Fill #0

## 2019-09-22 MED FILL — RIZATRIPTAN BENZOATE 10 MG: 10 | 30 days supply | Qty: 10 | Fill #1

## 2019-09-22 MED FILL — VITAMIN D3 50,000 UNITS CAP: 1.25 MG | 84 days supply | Qty: 12 | Fill #2

## 2019-10-04 MED FILL — BENZONATATE 200 MG CAP: 200 | 10 days supply | Qty: 30 | Fill #0

## 2019-10-17 DIAGNOSIS — G4733 Obstructive sleep apnea (adult) (pediatric): Secondary | ICD-10-CM | POA: Diagnosis not present

## 2019-10-20 MED FILL — BENAZEPRIL-HYDROCHLOROTHIAZ: 20-25 | 90 days supply | Qty: 135 | Fill #0

## 2019-10-20 MED FILL — POTASSIUM CHLORIDE CRYS ER: 20 | 90 days supply | Qty: 90 | Fill #1

## 2019-10-20 MED FILL — ACETAZOLAMIDE ER 500 MG CAP: 500 | 90 days supply | Qty: 180 | Fill #3

## 2019-10-20 MED FILL — MONTELUKAST SOD 10 MG TAB: 10 | 90 days supply | Qty: 90 | Fill #2

## 2019-10-20 MED FILL — QUETIAPINE 25 MG TABLET: 25 | 30 days supply | Qty: 30 | Fill #2

## 2019-10-20 MED FILL — IPRATROPIUM 0.06% SPRAY: 0.06 | 21 days supply | Qty: 15 | Fill #3

## 2019-10-20 MED FILL — VENLAFAXINE HCL ER 75 MG CA: 75 | 90 days supply | Qty: 90 | Fill #1

## 2019-10-30 MED FILL — AMOX-CLAV 500-125 MG TABLET: 500-125 | 10 days supply | Qty: 20 | Fill #0

## 2019-11-09 DIAGNOSIS — E785 Hyperlipidemia, unspecified: Secondary | ICD-10-CM | POA: Diagnosis not present

## 2019-11-09 DIAGNOSIS — E559 Vitamin D deficiency, unspecified: Secondary | ICD-10-CM | POA: Diagnosis not present

## 2019-11-09 DIAGNOSIS — I208 Other forms of angina pectoris: Secondary | ICD-10-CM | POA: Diagnosis not present

## 2019-11-09 DIAGNOSIS — G932 Benign intracranial hypertension: Secondary | ICD-10-CM | POA: Diagnosis not present

## 2019-11-09 DIAGNOSIS — I10 Essential (primary) hypertension: Secondary | ICD-10-CM | POA: Diagnosis not present

## 2019-11-09 DIAGNOSIS — G43909 Migraine, unspecified, not intractable, without status migrainosus: Secondary | ICD-10-CM | POA: Diagnosis not present

## 2019-11-27 MED FILL — BENZONATATE 200 MG CAP: 200 | 10 days supply | Qty: 30 | Fill #0

## 2019-12-08 MED FILL — MAGNESIUM OXIDE 400 MG TAB: 400 (240 MG | 90 days supply | Qty: 180 | Fill #1

## 2020-01-12 ENCOUNTER — Other Ambulatory Visit: Payer: Self-pay | Admitting: Neurology

## 2020-01-12 ENCOUNTER — Other Ambulatory Visit (HOSPITAL_COMMUNITY): Payer: Self-pay | Admitting: Internal Medicine

## 2020-01-12 MED FILL — VITAMIN D3 50,000 UNITS CAP: 1.25 MG | 84 days supply | Qty: 12 | Fill #3

## 2020-01-12 MED FILL — RIZATRIPTAN BENZOATE 10 MG: 10 | 30 days supply | Qty: 10 | Fill #2

## 2020-01-12 MED FILL — QUETIAPINE 25 MG TABLET: 25 | 30 days supply | Qty: 30 | Fill #4

## 2020-01-13 MED FILL — BENAZEPRIL-HYDROCHLOROTHIAZ: 20-25 | 90 days supply | Qty: 135 | Fill #0

## 2020-01-13 MED FILL — MONTELUKAST SOD 10 MG TAB: 10 | 90 days supply | Qty: 90 | Fill #0

## 2020-01-15 ENCOUNTER — Other Ambulatory Visit: Payer: Self-pay | Admitting: Neurology

## 2020-01-15 DIAGNOSIS — G4733 Obstructive sleep apnea (adult) (pediatric): Secondary | ICD-10-CM | POA: Diagnosis not present

## 2020-01-15 MED FILL — ACETAZOLAMIDE ER 500 MG CAP: 500 | 90 days supply | Qty: 180 | Fill #0

## 2020-01-31 MED FILL — predniSONE 20 MG TABS: 20 | 4 days supply | Qty: 8 | Fill #0

## 2020-01-31 MED FILL — LOSARTAN-HCTZ 100-25 MG TAB: 100-25 | 90 days supply | Qty: 90 | Fill #0

## 2020-02-08 DIAGNOSIS — R0789 Other chest pain: Secondary | ICD-10-CM | POA: Diagnosis not present

## 2020-02-08 DIAGNOSIS — I10 Essential (primary) hypertension: Secondary | ICD-10-CM | POA: Diagnosis not present

## 2020-02-08 DIAGNOSIS — E785 Hyperlipidemia, unspecified: Secondary | ICD-10-CM | POA: Diagnosis not present

## 2020-02-08 DIAGNOSIS — I208 Other forms of angina pectoris: Secondary | ICD-10-CM | POA: Diagnosis not present

## 2020-02-16 DIAGNOSIS — G932 Benign intracranial hypertension: Secondary | ICD-10-CM | POA: Diagnosis not present

## 2020-02-16 DIAGNOSIS — R7303 Prediabetes: Secondary | ICD-10-CM | POA: Diagnosis not present

## 2020-02-16 DIAGNOSIS — K219 Gastro-esophageal reflux disease without esophagitis: Secondary | ICD-10-CM | POA: Diagnosis not present

## 2020-02-16 DIAGNOSIS — I10 Essential (primary) hypertension: Secondary | ICD-10-CM | POA: Diagnosis not present

## 2020-02-16 DIAGNOSIS — R600 Localized edema: Secondary | ICD-10-CM | POA: Diagnosis not present

## 2020-02-16 DIAGNOSIS — R609 Edema, unspecified: Secondary | ICD-10-CM | POA: Diagnosis not present

## 2020-02-16 DIAGNOSIS — T783XXS Angioneurotic edema, sequela: Secondary | ICD-10-CM | POA: Diagnosis not present

## 2020-02-16 DIAGNOSIS — J45991 Cough variant asthma: Secondary | ICD-10-CM | POA: Diagnosis not present

## 2020-02-16 DIAGNOSIS — E785 Hyperlipidemia, unspecified: Secondary | ICD-10-CM | POA: Diagnosis not present

## 2020-02-16 DIAGNOSIS — H93A9 Pulsatile tinnitus, unspecified ear: Secondary | ICD-10-CM | POA: Diagnosis not present

## 2020-02-16 MED FILL — QUETIAPINE FUMARATE 25 MG T: 25 | 30 days supply | Qty: 30 | Fill #5

## 2020-02-16 MED FILL — TRIAMTERENE-HCTZ 75-50 MG T: 75-50 | 30 days supply | Qty: 30 | Fill #0

## 2020-03-08 ENCOUNTER — Other Ambulatory Visit (HOSPITAL_COMMUNITY): Payer: Self-pay | Admitting: Internal Medicine

## 2020-03-08 DIAGNOSIS — I10 Essential (primary) hypertension: Secondary | ICD-10-CM | POA: Diagnosis not present

## 2020-03-08 DIAGNOSIS — R7303 Prediabetes: Secondary | ICD-10-CM | POA: Diagnosis not present

## 2020-03-08 DIAGNOSIS — E785 Hyperlipidemia, unspecified: Secondary | ICD-10-CM | POA: Diagnosis not present

## 2020-03-08 DIAGNOSIS — G932 Benign intracranial hypertension: Secondary | ICD-10-CM | POA: Diagnosis not present

## 2020-03-08 DIAGNOSIS — K219 Gastro-esophageal reflux disease without esophagitis: Secondary | ICD-10-CM | POA: Diagnosis not present

## 2020-03-08 DIAGNOSIS — J45991 Cough variant asthma: Secondary | ICD-10-CM | POA: Diagnosis not present

## 2020-03-08 DIAGNOSIS — M169 Osteoarthritis of hip, unspecified: Secondary | ICD-10-CM | POA: Diagnosis not present

## 2020-03-08 DIAGNOSIS — R609 Edema, unspecified: Secondary | ICD-10-CM | POA: Diagnosis not present

## 2020-03-08 DIAGNOSIS — T783XXS Angioneurotic edema, sequela: Secondary | ICD-10-CM | POA: Diagnosis not present

## 2020-03-08 MED FILL — MAGNESIUM OXIDE 400 MG TAB: 400 (240 MG | 90 days supply | Qty: 180 | Fill #2

## 2020-03-08 MED FILL — POTASSIUM CHLORIDE CRYS ER: 20 | 90 days supply | Qty: 90 | Fill #2

## 2020-03-12 MED FILL — QUETIAPINE FUMARATE 25 MG T: 25 | 30 days supply | Qty: 30 | Fill #0

## 2020-03-12 MED FILL — TRIAMTERENE-HCTZ 75-50 MG T: 75-50 | 90 days supply | Qty: 90 | Fill #0

## 2020-03-13 ENCOUNTER — Other Ambulatory Visit: Payer: Self-pay | Admitting: Internal Medicine

## 2020-03-13 ENCOUNTER — Ambulatory Visit: Payer: 59 | Admitting: Neurology

## 2020-03-13 DIAGNOSIS — Z1231 Encounter for screening mammogram for malignant neoplasm of breast: Secondary | ICD-10-CM

## 2020-03-14 MED FILL — VENLAFAXINE HCL ER 75 MG CA: 75 | 90 days supply | Qty: 90 | Fill #2

## 2020-03-26 ENCOUNTER — Other Ambulatory Visit: Payer: Self-pay

## 2020-03-26 ENCOUNTER — Ambulatory Visit
Admission: RE | Admit: 2020-03-26 | Discharge: 2020-03-26 | Disposition: A | Discharge status: Home / Self Care | Payer: 59 | Source: Ambulatory Visit | Attending: Internal Medicine | Admitting: Internal Medicine

## 2020-03-26 DIAGNOSIS — Z1231 Encounter for screening mammogram for malignant neoplasm of breast: Secondary | ICD-10-CM | POA: Diagnosis not present

## 2020-03-29 MED FILL — AMOXICILLIN 500 MG CAPSULE: 500 | 7 days supply | Qty: 21 | Fill #0

## 2020-03-29 MED FILL — CHLORHEXIDINE 0.12% RINSE: 0.12 | 16 days supply | Qty: 473 | Fill #0

## 2020-03-29 MED FILL — NAPROXEN SODIUM 550 MG TABS: 550 | 6 days supply | Qty: 12 | Fill #0

## 2020-04-08 DIAGNOSIS — G4733 Obstructive sleep apnea (adult) (pediatric): Secondary | ICD-10-CM | POA: Diagnosis not present

## 2020-04-19 MED FILL — VITAMIN D3 50,000 UNITS CAP: 1.25 MG | 84 days supply | Qty: 12 | Fill #0

## 2020-04-19 MED FILL — QUETIAPINE FUMARATE 25 MG T: 25 | 30 days supply | Qty: 30 | Fill #1

## 2020-04-19 MED FILL — MAGNESIUM OXIDE 400 MG TAB: 400 (240 MG | 90 days supply | Qty: 180 | Fill #0

## 2020-04-19 MED FILL — ACETAZOLAMIDE ER 500 MG CAP: 500 | 90 days supply | Qty: 180 | Fill #1

## 2020-05-10 ENCOUNTER — Ambulatory Visit: Payer: 59 | Attending: Internal Medicine

## 2020-05-10 ENCOUNTER — Other Ambulatory Visit (HOSPITAL_BASED_OUTPATIENT_CLINIC_OR_DEPARTMENT_OTHER): Payer: Self-pay | Admitting: Internal Medicine

## 2020-05-10 DIAGNOSIS — Z23 Encounter for immunization: Secondary | ICD-10-CM

## 2020-05-16 MED FILL — PFIZER-BIONTECH COVID-19 VA: 30 | 1 days supply | Qty: 0 | Fill #0

## 2020-05-16 NOTE — Progress Notes (Signed)
   Covid-19 Vaccination Clinic  Name:  [name]    MRN: [id number] DOB: [birth year]  05/16/2020  [name] was observed post Covid-19 immunization for 15 minutes without incident. She was provided with Vaccine Information Sheet and instruction to access the V-Safe system.   [name] was instructed to call 911 with any severe reactions post vaccine: Marland Kitchen Difficulty breathing  . Swelling of face and throat  . A fast heartbeat  . A bad rash all over body  . Dizziness and weakness

## 2020-05-28 ENCOUNTER — Ambulatory Visit: Payer: 59 | Admitting: Neurology

## 2020-06-07 MED FILL — MONTELUKAST SOD 10 MG TAB: 10 | 90 days supply | Qty: 90 | Fill #1

## 2020-06-07 MED FILL — TRIAMTERENE-HCTZ 75-50 MG T: 75-50 | 90 days supply | Qty: 90 | Fill #1

## 2020-06-07 MED FILL — VENLAFAXINE HCL ER 75 MG CA: 75 | 90 days supply | Qty: 90 | Fill #3

## 2020-06-19 ENCOUNTER — Ambulatory Visit: Payer: 59 | Admitting: Neurology

## 2020-06-19 ENCOUNTER — Other Ambulatory Visit: Payer: Self-pay

## 2020-06-19 ENCOUNTER — Other Ambulatory Visit: Payer: Self-pay | Admitting: Neurology

## 2020-06-19 ENCOUNTER — Encounter: Payer: Self-pay | Admitting: Neurology

## 2020-06-19 VITALS — BP 128/84 | HR 83 | Ht 65.0 in | Wt 254.4 lb

## 2020-06-19 DIAGNOSIS — G932 Benign intracranial hypertension: Secondary | ICD-10-CM

## 2020-06-19 DIAGNOSIS — I208 Other forms of angina pectoris: Secondary | ICD-10-CM | POA: Diagnosis not present

## 2020-06-19 DIAGNOSIS — G43019 Migraine without aura, intractable, without status migrainosus: Secondary | ICD-10-CM | POA: Diagnosis not present

## 2020-06-19 DIAGNOSIS — I10 Essential (primary) hypertension: Secondary | ICD-10-CM | POA: Diagnosis not present

## 2020-06-19 DIAGNOSIS — R002 Palpitations: Secondary | ICD-10-CM | POA: Diagnosis not present

## 2020-06-19 MED ORDER — ACETAZOLAMIDE ER 500 MG PO CP12: 500.0000 mg | ORAL_CAPSULE | Freq: Two times a day (BID) | ORAL | 3 refills | Status: DC

## 2020-06-19 MED ORDER — RIZATRIPTAN BENZOATE 10 MG PO TABS: 10.0000 mg | ORAL_TABLET | Freq: Three times a day (TID) | ORAL | 3 refills | Status: DC | PRN

## 2020-06-19 MED ORDER — VENLAFAXINE HCL ER 75 MG PO CP24: 75.0000 mg | ORAL_CAPSULE | Freq: Every day | ORAL | 3 refills | Status: DC

## 2020-06-19 MED FILL — RIZATRIPTAN BENZOATE 10 MG: 10 | 17 days supply | Qty: 10 | Fill #0

## 2020-06-19 NOTE — Progress Notes (Signed)
PATIENT: [name] DOB: 09/04/1965  REASON FOR VISIT: follow up HISTORY FROM: patient  HISTORY OF PRESENT ILLNESS: Today 06/19/20  [name] is a 54 year old female with history of obesity, possible pseudotumor cerebri, and migraine headaches.  She is on Diamox and Effexor.  She is on CPAP.  Headaches remain well controlled.  She may have no more than 2 migraines a month, sometimes none.  Usually brought on by stress.  For headache, Maxalt is beneficial, but laying down and sleeping works the best.  She is a Marine scientist, has FMLA, she may miss 1 or 2 days every 3 or so months.  Since 2014, has some occasional dizziness with standing, may feel she leans to the right, her balance is not as good.  Occasionally hears a swishing to her right ear, generally well controlled when her weight is maintained, she stays hydrated, and takes her medications.  She has yet to see her ophthalmologist.  Previously she has tried to wean off Diamox, and her headaches return.  CPAP is quite beneficial for headaches.  Presents today for evaluation unaccompanied.  HISTORY 07/11/2019 SS: [name] is a 54 year old female with history of obesity, possible pseudotumor cerebri.  She remains on Diamox and Effexor.  She reported previously that her eye doctor has found that her papilledema had disappeared.  She reports she is due for a repeat eye visit in the next several months.  She indicates her headaches have been doing very well, this is the best she has felt in a long time.  She reports on average she may have 2 migraines a month.  She says she is not having to take Maxalt as often.  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  She finds Maxalt to be beneficial, along with Zofran, but afterwards she feels drowsy and needs to lie down.  She remains on CPAP.  She presents today for evaluation via virtual visit.   REVIEW OF SYSTEMS: Out of a complete 14 system review of symptoms,  the patient complains only of the following symptoms, and all other reviewed systems are negative.  Headache  ALLERGIES: Allergies  Allergen Reactions   Benazepril-Hydrochlorothiazide Swelling    angioedema   Codeine Nausea And Vomiting   Hydrocodone Itching    HOME MEDICATIONS: Outpatient Medications Prior to Visit  Medication Sig Dispense Refill   cetirizine (ZYRTEC) 10 MG tablet Take 10 mg by mouth daily.     cyclobenzaprine (FLEXERIL) 10 MG tablet Take 10 mg by mouth daily as needed for muscle spasms.     famotidine (PEPCID) 20 MG tablet Take 20 mg by mouth 2 (two) times daily.      fluticasone (FLONASE) 50 MCG/ACT nasal spray Place 2 sprays into both nostrils daily. 1 g 0   ipratropium (ATROVENT) 0.06 % nasal spray Place 2 sprays into both nostrils 4 (four) times daily. 15 mL 0   Magnesium Oxide 400 (240 Mg) MG TABS Take 400 mg by mouth 2 (two) times daily.  2   ondansetron (ZOFRAN) 4 MG tablet Take 1 tablet (4 mg total) by mouth every 8 (eight) hours as needed for nausea. 20 tablet 0   potassium chloride SA (K-DUR,KLOR-CON) 20 MEQ tablet Take 20 mEq by mouth 2 (two) times daily.     sennosides-docusate sodium (SENOKOT-S) 8.6-50 MG tablet Take 2 tablets by mouth as needed.      triamterene-hydrochlorothiazide (MAXZIDE) 75-50 MG tablet Take  1 tablet by mouth daily.     Vitamin D, Ergocalciferol, (DRISDOL) 50000 units CAPS capsule Take 50,000 Units by mouth every 7 (seven) days. Every Sunday.     acetaZOLAMIDE (DIAMOX) 500 MG capsule TAKE 1 CAPSULE BY MOUTH TWICE DAILY 180 capsule 3   rizatriptan (MAXALT) 10 MG tablet Take 1 tablet (10 mg total) by mouth 3 (three) times daily as needed for migraine. 10 tablet 3   venlafaxine XR (EFFEXOR-XR) 75 MG 24 hr capsule Take 1 capsule (75 mg total) by mouth at bedtime. 90 capsule 3   benazepril-hydrochlorthiazide (LOTENSIN HCT) 20-25 MG per tablet Take 0.5-1 tablets by mouth 2 (two) times daily. Patient takes 1 tablet in the  morning and 0.5 in the evening.     No facility-administered medications prior to visit.    PAST MEDICAL HISTORY: Past Medical History:  Diagnosis Date   Allergy    takes Zyrtec daily   Arthritis    Back pain    arthritis   Benign intracranial hypertension    Chronic constipation    takes Stool Softener   Dizziness    Eczema    Family history of anesthesia complication    mother got confused some after anesthesia   Gastroesophageal reflux disease    takes Pepcid daily   Headache(784.0)    benign intercranial HTN d/t CSF leak, migraines   History of bronchitis    pt states a very long time ago   History of MRSA infection    > 74yrs ago   Hypertension    takes Lotensin daily   Lumbago    Obesity, unspecified    Peripheral edema    Pulsatile tinnitus    Sleep apnea    Unspecified vitamin D deficiency     PAST SURGICAL HISTORY: Past Surgical History:  Procedure Laterality Date   ANTERIOR AND POSTERIOR REPAIR N/A 10/10/2015   Procedure: ANTERIOR (CYSTOCELE) AND POSTERIOR REPAIR (RECTOCELE);  Surgeon: Bjorn Loser, MD;  Location: [facility];  Service: Urology;  Laterality: N/A;   CARDIOVASCULAR STRESS TEST     CESAREAN SECTION  58yrs ago   CHOLECYSTECTOMY     CYSTO N/A 10/10/2015   Procedure: CYSTO;  Surgeon: Bjorn Loser, MD;  Location: [facility];  Service: Urology;  Laterality: N/A;   GALLBLADDER SURGERY  6yrs ago   SINUS ENDO W/FUSION Bilateral 02/03/2013   Procedure: ENDOSCOPIC SINUS SURGERY WITH FUSION NAVIGATION;  Surgeon: Ruby Cola, MD;  Location: [facility];  Service: ENT;  Laterality: Bilateral;  Repaired CSF Leak   US ECHOCARDIOGRAPHY     VAGINAL HYSTERECTOMY N/A 10/10/2015   Procedure: HYSTERECTOMY VAGINAL;  Surgeon: Servando Salina, MD;  Location: [facility];  Service: Gynecology;  Laterality: N/A;   VAGINAL PROLAPSE REPAIR N/A 10/10/2015   Procedure: VAULT PROLAPSE AND GRAFT;  Surgeon: Bjorn Loser, MD;  Location: [facility];  Service:  Urology;  Laterality: N/A;    FAMILY HISTORY: Family History  Problem Relation Age of Onset   Cancer - Prostate Father    Hypertension Father    Hypertension Mother    Diabetes Mother    Heart attack Neg Hx    Hyperlipidemia Neg Hx    Sudden death Neg Hx     SOCIAL HISTORY: Social History   Socioeconomic History   Marital status: Married    Spouse name: Nicole Kindred   Number of children: 12   Years of education: 12+   Highest education level: Not on file  Occupational History    Employer: [employer]  Tobacco Use   Smoking status: Never Smoker   Smokeless tobacco: Never Used  Substance and Sexual Activity   Alcohol use: No    Alcohol/week: 0.0 standard drinks   Drug use: No   Sexual activity: Yes  Other Topics Concern   Not on file  Social History Narrative   Patient lives at home with husband and son. Nicole Kindred)   Patient has 2 children.    Patient is currently working as an Therapist, sports.   Patient has a college education.    Patient is right handed.    Consumes drinks 1 16oz soda 3 times a week, rarely drinks coffee.    Works night shift   Social Determinants of Radio broadcast assistant Strain: Not on file  Food Insecurity: Not on file  Transportation Needs: Not on file  Physical Activity: Not on file  Stress: Not on file  Social Connections: Not on file  Intimate Partner Violence: Not on file    PHYSICAL EXAM  Vitals:   06/19/20 1433  BP: 128/84  Pulse: 83  Weight: 254 lb 6.4 oz (115.4 kg)  Height: 5\' 5"  (1.651 m)   Body mass index is 42.33 kg/m.  Generalized: Well developed, in no acute distress   Neurological examination  Mentation: Alert oriented to time, place, history taking. Follows all commands speech and language fluent Cranial nerve II-XII: Pupils were equal round reactive to light. Extraocular movements were full, visual field were full on confrontational test. Facial sensation and strength were normal.  Head turning and shoulder  shrug  were normal and symmetric. Motor: The motor testing reveals 5 over 5 strength of all 4 extremities. Good symmetric motor tone is noted throughout.  Sensory: Sensory testing is intact to soft touch on all 4 extremities. No evidence of extinction is noted.  Coordination: Cerebellar testing reveals good finger-nose-finger and heel-to-shin bilaterally.  Gait and station: Gait is normal. Tandem gait is unsteady. Romberg is negative. No drift is seen.  Reflexes: Deep tendon reflexes are symmetric and normal bilaterally.   DIAGNOSTIC DATA (LABS, IMAGING, TESTING) - I reviewed patient records, labs, notes, testing and imaging myself where available.  Lab Results  Component Value Date   WBC 10.4 10/11/2015   HGB 12.3 10/11/2015   HCT 37.8 10/11/2015   MCV 81.8 10/11/2015   PLT 369 10/11/2015      Component Value Date/Time   NA 134 (L) 10/11/2015 0530   NA 140 12/10/2011 0000   K 3.3 (L) 10/11/2015 0530   CL 103 10/11/2015 0530   CO2 23 10/11/2015 0530   GLUCOSE 107 (H) 10/11/2015 0530   BUN 13 10/11/2015 0530   BUN 11 12/10/2011 0000   CREATININE 0.77 10/11/2015 0530   CALCIUM 8.5 (L) 10/11/2015 0530   PROT 6.4 02/05/2013 0502   ALBUMIN 3.1 (L) 02/05/2013 0502   AST 20 02/05/2013 0502   ALT 12 02/05/2013 0502   ALKPHOS 77 02/05/2013 0502   BILITOT 0.4 02/05/2013 0502   GFRNONAA >60 10/11/2015 0530   GFRAA >60 10/11/2015 0530   No results found for: CHOL, HDL, LDLCALC, LDLDIRECT, TRIG, CHOLHDL No results found for: HGBA1C No results found for: VITAMINB12 No results found for: TSH  ASSESSMENT AND PLAN 54 y.o. year old female  has a past medical history of Allergy, Arthritis, Back pain, Benign intracranial hypertension, Chronic constipation, Dizziness, Eczema, Family history of anesthesia complication, Gastroesophageal reflux disease, Headache(784.0), History of bronchitis, History of MRSA infection, Hypertension, Lumbago, Obesity, unspecified, Peripheral edema, Pulsatile  tinnitus, Sleep apnea, and Unspecified vitamin D deficiency. here with:  1.  Pseudotumor cerebri 2.  Migraine headache -Headaches remain well controlled -Continue Diamox 500 mg twice a day -Continue Effexor XR 75 mg daily -Continue Maxalt as needed for acute headache -Needs to follow-up with ophthalmologist -Has routine follow-up with PCP, follows labs -Follow-up in 8 months or sooner if needed  I spent 30 minutes of face-to-face and non-face-to-face time with patient.  This included previsit chart review, lab review, study review, order entry, electronic health record documentation, patient education.  Butler Denmark, AGNP-C, DNP 06/19/2020, 3:06 PM [facility] [address] [place],  [address] [phone]

## 2020-06-19 NOTE — Patient Instructions (Signed)
Continue current medications Please see your eye doctor  See you back in 8 months

## 2020-06-19 NOTE — Progress Notes (Signed)
I have read the note, and I agree with the clinical assessment and plan.  Meagon Duskin K Krystofer Hevener   

## 2020-07-08 DIAGNOSIS — G4733 Obstructive sleep apnea (adult) (pediatric): Secondary | ICD-10-CM | POA: Diagnosis not present

## 2020-08-01 ENCOUNTER — Other Ambulatory Visit (HOSPITAL_COMMUNITY): Payer: Self-pay | Admitting: Internal Medicine

## 2020-08-01 DIAGNOSIS — G5603 Carpal tunnel syndrome, bilateral upper limbs: Secondary | ICD-10-CM | POA: Diagnosis not present

## 2020-08-01 DIAGNOSIS — G43009 Migraine without aura, not intractable, without status migrainosus: Secondary | ICD-10-CM | POA: Diagnosis not present

## 2020-08-01 DIAGNOSIS — I10 Essential (primary) hypertension: Secondary | ICD-10-CM | POA: Diagnosis not present

## 2020-08-01 DIAGNOSIS — H93A9 Pulsatile tinnitus, unspecified ear: Secondary | ICD-10-CM | POA: Diagnosis not present

## 2020-08-01 DIAGNOSIS — G4700 Insomnia, unspecified: Secondary | ICD-10-CM | POA: Diagnosis not present

## 2020-08-01 DIAGNOSIS — G932 Benign intracranial hypertension: Secondary | ICD-10-CM | POA: Diagnosis not present

## 2020-08-01 DIAGNOSIS — G4733 Obstructive sleep apnea (adult) (pediatric): Secondary | ICD-10-CM | POA: Diagnosis not present

## 2020-08-01 DIAGNOSIS — E785 Hyperlipidemia, unspecified: Secondary | ICD-10-CM | POA: Diagnosis not present

## 2020-08-01 DIAGNOSIS — M62838 Other muscle spasm: Secondary | ICD-10-CM | POA: Diagnosis not present

## 2020-08-01 MED FILL — NEBIVOLOL HCL 5 MG TABS: 5 | 90 days supply | Qty: 90 | Fill #0

## 2020-08-01 MED FILL — CHLORHEXIDINE 0.12% RINSE: 0.12 | 23 days supply | Qty: 473 | Fill #0

## 2020-08-02 ENCOUNTER — Other Ambulatory Visit (HOSPITAL_COMMUNITY): Payer: Self-pay | Admitting: Internal Medicine

## 2020-08-02 MED FILL — HYDROXYZINE HCL 25 MG TABS: 25 | 30 days supply | Qty: 30 | Fill #0

## 2020-09-05 ENCOUNTER — Other Ambulatory Visit (HOSPITAL_COMMUNITY): Payer: Self-pay | Admitting: Cardiology

## 2020-09-05 DIAGNOSIS — Z6841 Body Mass Index (BMI) 40.0 and over, adult: Secondary | ICD-10-CM | POA: Diagnosis not present

## 2020-09-05 DIAGNOSIS — G43009 Migraine without aura, not intractable, without status migrainosus: Secondary | ICD-10-CM | POA: Diagnosis not present

## 2020-09-05 DIAGNOSIS — I10 Essential (primary) hypertension: Secondary | ICD-10-CM | POA: Diagnosis not present

## 2020-09-05 DIAGNOSIS — T783XXS Angioneurotic edema, sequela: Secondary | ICD-10-CM | POA: Diagnosis not present

## 2020-09-05 DIAGNOSIS — G932 Benign intracranial hypertension: Secondary | ICD-10-CM | POA: Diagnosis not present

## 2020-09-05 DIAGNOSIS — E785 Hyperlipidemia, unspecified: Secondary | ICD-10-CM | POA: Diagnosis not present

## 2020-09-05 DIAGNOSIS — M62838 Other muscle spasm: Secondary | ICD-10-CM | POA: Diagnosis not present

## 2020-09-05 DIAGNOSIS — G5603 Carpal tunnel syndrome, bilateral upper limbs: Secondary | ICD-10-CM | POA: Diagnosis not present

## 2020-09-05 MED FILL — TRIAMTERENE-HCTZ 75-50 MG T: 75-50 | 90 days supply | Qty: 90 | Fill #2

## 2020-09-05 MED FILL — VITAMIN D3 50,000 UNITS CAP: 1.25 MG | 84 days supply | Qty: 12 | Fill #0

## 2020-09-05 MED FILL — MONTELUKAST SOD 10 MG TAB: 10 | 90 days supply | Qty: 90 | Fill #2

## 2020-09-05 MED FILL — MAGNESIUM OXIDE 400 MG TAB: 400 (240 MG | 90 days supply | Qty: 180 | Fill #0

## 2020-09-05 MED FILL — ONDANSETRON HCL 4 MG TABS: 4 | 20 days supply | Qty: 60 | Fill #0

## 2020-09-05 MED FILL — VENLAFAXINE HCL ER 75 MG CA: 75 | 90 days supply | Qty: 90 | Fill #0

## 2020-09-05 MED FILL — ACETAZOLAMIDE ER 500 MG CAP: 500 | 90 days supply | Qty: 180 | Fill #0

## 2020-09-05 MED FILL — CYCLOBENZAPRINE HCL 10 MG T: 10 | 60 days supply | Qty: 120 | Fill #0

## 2020-09-05 MED FILL — HYDROXYZINE HCL 25 MG TABS: 25 | 30 days supply | Qty: 30 | Fill #1

## 2020-09-19 ENCOUNTER — Ambulatory Visit: Payer: Self-pay | Admitting: Adult Health

## 2020-09-19 ENCOUNTER — Encounter: Payer: Self-pay | Admitting: Adult Health

## 2020-09-19 NOTE — Progress Notes (Deleted)
PATIENT: [name] DOB: 27-Aug-1965  REASON FOR VISIT: follow up HISTORY FROM: patient  HISTORY OF PRESENT ILLNESS: Today 09/19/20:  [name] is a 55 year old female with a history of obstructive sleep apnea on CPAP.      09/14/19: [name] is a 55 year old female with a history of obstructive sleep apnea on CPAP.  Her download indicates that she used her machine 28 out of 30 days for compliance of 93%.  She used her machine greater than 4 hours 13 days for compliance of 43%.  On average she uses her machine 3 hours and 53 minutes.  Residual AHI is 0.5 on 7 cm of water with EPR 2.  Leak in the 95th percentile is 33.4 L/min.  She reports that most nights she ends up taking her mask off because it is too hot.  She has turn off the heated tubing feature she has not messed with the humidity.  HISTORY 03/15/2018: I reviewed her CPAP compliance data from 02/12/2018 through 03/13/2018 which is a total of 30 days, during which time she used her CPAP only 21 days with percent used days greater than 4 hours at 17%, indicating significantly suboptimal compliance, her compliance overall was just a little bit better in the past 90 days. She reports she struggles with facial sweating and the FFM tends to slide off, she also has had a tendency to pull the mask off in the middle of the night, without realizing. She does typically feel better when she is able to use her CPAP consistently, her morning headaches in particular tend to be better when she has been on CPAP for most of the night.   REVIEW OF SYSTEMS: Out of a complete 14 system review of symptoms, the patient complains only of the following symptoms, and all other reviewed systems are negative.  FSS ESS  ALLERGIES: Allergies  Allergen Reactions  . Benazepril-Hydrochlorothiazide Swelling    angioedema  . Codeine Nausea And Vomiting  . Hydrocodone Itching    HOME MEDICATIONS: Outpatient Medications Prior to Visit  Medication  Sig Dispense Refill  . acetaZOLAMIDE (DIAMOX) 500 MG capsule Take 1 capsule (500 mg total) by mouth 2 (two) times daily. 180 capsule 3  . cetirizine (ZYRTEC) 10 MG tablet Take 10 mg by mouth daily.    . cyclobenzaprine (FLEXERIL) 10 MG tablet Take 10 mg by mouth daily as needed for muscle spasms.    . famotidine (PEPCID) 20 MG tablet Take 20 mg by mouth 2 (two) times daily.     . fluticasone (FLONASE) 50 MCG/ACT nasal spray Place 2 sprays into both nostrils daily. 1 g 0  . ipratropium (ATROVENT) 0.06 % nasal spray Place 2 sprays into both nostrils 4 (four) times daily. 15 mL 0  . Magnesium Oxide 400 (240 Mg) MG TABS Take 400 mg by mouth 2 (two) times daily.  2  . ondansetron (ZOFRAN) 4 MG tablet Take 1 tablet (4 mg total) by mouth every 8 (eight) hours as needed for nausea. 20 tablet 0  . potassium chloride SA (K-DUR,KLOR-CON) 20 MEQ tablet Take 20 mEq by mouth 2 (two) times daily.    . rizatriptan (MAXALT) 10 MG tablet Take 1 tablet (10 mg total) by mouth 3 (three) times daily as needed for migraine. 10 tablet 3  . sennosides-docusate sodium (SENOKOT-S) 8.6-50 MG tablet Take 2 tablets by mouth as needed.     . triamterene-hydrochlorothiazide (MAXZIDE) 75-50 MG tablet Take 1 tablet by mouth daily.    Marland Kitchen  venlafaxine XR (EFFEXOR-XR) 75 MG 24 hr capsule Take 1 capsule (75 mg total) by mouth at bedtime. 90 capsule 3  . Vitamin D, Ergocalciferol, (DRISDOL) 50000 units CAPS capsule Take 50,000 Units by mouth every 7 (seven) days. Every Sunday.     No facility-administered medications prior to visit.    PAST MEDICAL HISTORY: Past Medical History:  Diagnosis Date  . Allergy    takes Zyrtec daily  . Arthritis   . Back pain    arthritis  . Benign intracranial hypertension   . Chronic constipation    takes Stool Softener  . Dizziness   . Eczema   . Family history of anesthesia complication    mother got confused some after anesthesia  . Gastroesophageal reflux disease    takes Pepcid daily  .  Headache(784.0)    benign intercranial HTN d/t CSF leak, migraines  . History of bronchitis    pt states a very long time ago  . History of MRSA infection    > 35yrs ago  . Hypertension    takes Lotensin daily  . Lumbago   . Obesity, unspecified   . Peripheral edema   . Pulsatile tinnitus   . Sleep apnea   . Unspecified vitamin D deficiency     PAST SURGICAL HISTORY: Past Surgical History:  Procedure Laterality Date  . ANTERIOR AND POSTERIOR REPAIR N/A 10/10/2015   Procedure: ANTERIOR (CYSTOCELE) AND POSTERIOR REPAIR (RECTOCELE);  Surgeon: Bjorn Loser, MD;  Location: [facility];  Service: Urology;  Laterality: N/A;  . CARDIOVASCULAR STRESS TEST    . CESAREAN SECTION  21yrs ago  . CHOLECYSTECTOMY    . CYSTO N/A 10/10/2015   Procedure: CYSTO;  Surgeon: Bjorn Loser, MD;  Location: [facility];  Service: Urology;  Laterality: N/A;  . GALLBLADDER SURGERY  59yrs ago  . SINUS ENDO W/FUSION Bilateral 02/03/2013   Procedure: ENDOSCOPIC SINUS SURGERY WITH FUSION NAVIGATION;  Surgeon: Ruby Cola, MD;  Location: [facility];  Service: ENT;  Laterality: Bilateral;  Repaired CSF Leak  . US ECHOCARDIOGRAPHY    . VAGINAL HYSTERECTOMY N/A 10/10/2015   Procedure: HYSTERECTOMY VAGINAL;  Surgeon: Servando Salina, MD;  Location: [facility];  Service: Gynecology;  Laterality: N/A;  . VAGINAL PROLAPSE REPAIR N/A 10/10/2015   Procedure: VAULT PROLAPSE AND GRAFT;  Surgeon: Bjorn Loser, MD;  Location: [facility];  Service: Urology;  Laterality: N/A;    FAMILY HISTORY: Family History  Problem Relation Age of Onset  . Cancer - Prostate Father   . Hypertension Father   . Hypertension Mother   . Diabetes Mother   . Heart attack Neg Hx   . Hyperlipidemia Neg Hx   . Sudden death Neg Hx     SOCIAL HISTORY: Social History   Socioeconomic History  . Marital status: Married    Spouse name: Nicole Kindred  . Number of children: 12  . Years of education: 12+  . Highest education level: Not on file  Occupational History     Employer: [employer]  Tobacco Use  . Smoking status: Never Smoker  . Smokeless tobacco: Never Used  Substance and Sexual Activity  . Alcohol use: No    Alcohol/week: 0.0 standard drinks  . Drug use: No  . Sexual activity: Yes  Other Topics Concern  . Not on file  Social History Narrative   Patient lives at home with husband and son. Nicole Kindred)   Patient has 2 children.    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    Patient is right handed.    Consumes drinks 1 16oz soda 3 times a week, rarely drinks coffee.    Works night shift   Social Determinants of Radio broadcast assistant Strain: Not on file  Food Insecurity: Not on file  Transportation Needs: Not on file  Physical Activity: Not on file  Stress: Not on file  Social Connections: Not on file  Intimate Partner Violence: Not on file      PHYSICAL EXAM  There were no vitals filed for this visit. There is no height or weight on file to calculate BMI.  Generalized: Well developed, in no acute distress  Chest: Lungs clear to auscultation bilaterally  Neurological examination  Mentation: Alert oriented to time, place, history taking. Follows all commands speech and language fluent Cranial nerve II-XII: Extraocular movements were full, visual field were full on confrontational test Head turning and shoulder shrug  were normal and symmetric. Motor: The motor testing reveals 5 over 5 strength of all 4 extremities. Good symmetric motor tone is noted throughout.  Sensory: Sensory testing is intact to soft touch on all 4 extremities. No evidence of extinction is noted.  Gait and station: Gait is normal.    DIAGNOSTIC DATA (LABS, IMAGING, TESTING) - I reviewed patient records, labs, notes, testing and imaging myself where available.  Lab Results  Component Value Date   WBC 10.4 10/11/2015   HGB 12.3 10/11/2015   HCT 37.8 10/11/2015   MCV 81.8 10/11/2015   PLT 369 10/11/2015       Component Value Date/Time   NA 134 (L) 10/11/2015 0530   NA 140 12/10/2011 0000   K 3.3 (L) 10/11/2015 0530   CL 103 10/11/2015 0530   CO2 23 10/11/2015 0530   GLUCOSE 107 (H) 10/11/2015 0530   BUN 13 10/11/2015 0530   BUN 11 12/10/2011 0000   CREATININE 0.77 10/11/2015 0530   CALCIUM 8.5 (L) 10/11/2015 0530   PROT 6.4 02/05/2013 0502   ALBUMIN 3.1 (L) 02/05/2013 0502   AST 20 02/05/2013 0502   ALT 12 02/05/2013 0502   ALKPHOS 77 02/05/2013 0502   BILITOT 0.4 02/05/2013 0502   GFRNONAA >60 10/11/2015 0530   GFRAA >60 10/11/2015 0530      ASSESSMENT AND PLAN 55 y.o. year old female  has a past medical history of Allergy, Arthritis, Back pain, Benign intracranial hypertension, Chronic constipation, Dizziness, Eczema, Family history of anesthesia complication, Gastroesophageal reflux disease, Headache(784.0), History of bronchitis, History of MRSA infection, Hypertension, Lumbago, Obesity, unspecified, Peripheral edema, Pulsatile tinnitus, Sleep apnea, and Unspecified vitamin D deficiency. here with:  1. OSA on CPAP  - CPAP compliance suboptimal - Good treatment of AHI  - Encourage patient to use CPAP nightly and > 4 hours each night -Advised patient to adjust humidity if this does not offer more comfort and we can consider a mask refitting - F/U in 1 year or sooner if needed   I spent 15 minutes of face-to-face and non-face-to-face time with patient.  This included previsit chart review,  study review, order entry, electronic health record documentation, patient education.  Ward Givens, MSN, NP-C 09/19/2020, 1:42 PM [facility] [address] [place], [place] [address] [phone]  I reviewed the above note and documentation by the Nurse Practitioner and agree with the history, exam, assessment and plan as outlined above. I was available for consultation. Star Age, MD, PhD [facility])

## 2020-09-27 ENCOUNTER — Other Ambulatory Visit (HOSPITAL_BASED_OUTPATIENT_CLINIC_OR_DEPARTMENT_OTHER): Payer: Self-pay

## 2020-09-30 DIAGNOSIS — G4733 Obstructive sleep apnea (adult) (pediatric): Secondary | ICD-10-CM | POA: Diagnosis not present

## 2020-10-12 ENCOUNTER — Ambulatory Visit
Admission: EM | Admit: 2020-10-12 | Discharge: 2020-10-12 | Disposition: A | Discharge status: Home / Self Care | Payer: 59 | Attending: Family Medicine | Admitting: Family Medicine

## 2020-10-12 ENCOUNTER — Other Ambulatory Visit: Payer: Self-pay

## 2020-10-12 DIAGNOSIS — Z1152 Encounter for screening for COVID-19: Secondary | ICD-10-CM | POA: Diagnosis not present

## 2020-10-12 DIAGNOSIS — J069 Acute upper respiratory infection, unspecified: Secondary | ICD-10-CM | POA: Diagnosis not present

## 2020-10-12 NOTE — Discharge Instructions (Addendum)
You have been tested for COVID-19 today. °If your test returns positive, you will receive a phone call from [facility] regarding your results. °Negative test results are not called. °Both positive and negative results area always visible on MyChart. °If you do not have a MyChart account, sign up instructions are provided in your discharge papers. °Please do not hesitate to contact us should you have questions or concerns. ° °

## 2020-10-12 NOTE — ED Triage Notes (Signed)
Pt presents with nasal congestion and feeling unwell for past couple of days, wants covid test

## 2020-10-12 NOTE — ED Provider Notes (Signed)
[place]   [id number] 10/12/20 Arrival Time: 0905  ASSESSMENT & PLAN:  1. Encounter for screening for COVID-19   2. Viral URI with cough      COVID-19 testing sent. No work note needed. OTC symptom care as needed.   Discharge Instructions     You have been tested for COVID-19 today. If your test returns positive, you will receive a phone call from [facility] regarding your results. Negative test results are not called. Both positive and negative results area always visible on MyChart. If you do not have a MyChart account, sign up instructions are provided in your discharge papers. Please do not hesitate to contact us should you have questions or concerns.        Follow-up Information    Rogers Blocker, MD.   Specialty: Internal Medicine Why: As needed. Contact information: [address] [place] [address] [phone]               Reviewed expectations re: course of current medical issues. Questions answered. Outlined signs and symptoms indicating need for more acute intervention. Understanding verbalized. After Visit Summary given.   SUBJECTIVE: History from: patient. [name] is a 55 y.o. female who presents with worries regarding COVID-19. 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 Recent travel: none. Reports: nasal congestion, ST, mild cough, generally feeling unwell; abrupt onset; x 2 days. Denies: fever and difficulty breathing. Normal PO intake without n/v/d. Desires COVID testing.   OBJECTIVE:  Vitals:   10/12/20 0934  BP: 116/73  Pulse: 65  Resp: 20  Temp: 98.3 F (36.8 C)  TempSrc: Oral  SpO2: 97%    General appearance: alert; no distress Eyes: PERRLA; EOMI; conjunctiva normal HENT: [place]; AT; with nasal congestion Neck: supple  Lungs: speaks full sentences without difficulty; unlabored Extremities: no edema Skin: warm and dry Neurologic: normal gait Psychological: alert and cooperative;  normal mood and affect  Labs:  Labs Reviewed  NOVEL CORONAVIRUS, NAA    Allergies  Allergen Reactions  . Benazepril-Hydrochlorothiazide Swelling    angioedema  . Codeine Nausea And Vomiting  . Hydrocodone Itching    Past Medical History:  Diagnosis Date  . Allergy    takes Zyrtec daily  . Arthritis   . Back pain    arthritis  . Benign intracranial hypertension   . Chronic constipation    takes Stool Softener  . Dizziness   . Eczema   . Family history of anesthesia complication    mother got confused some after anesthesia  . Gastroesophageal reflux disease    takes Pepcid daily  . Headache(784.0)    benign intercranial HTN d/t CSF leak, migraines  . History of bronchitis    pt states a very long time ago  . History of MRSA infection    > 74yrs ago  . Hypertension    takes Lotensin daily  . Lumbago   . Obesity, unspecified   . Peripheral edema   . Pulsatile tinnitus   . Sleep apnea   . Unspecified vitamin D deficiency    Social History   Socioeconomic History  . Marital status: Married    Spouse name: Nicole Kindred  . Number of children: 12  . Years of education: 12+  . Highest education level: Not on file  Occupational History    Employer: [employer]  Tobacco Use  . Smoking status: Never Smoker  . Smokeless tobacco: Never Used  Substance and Sexual Activity  . Alcohol use: No  Alcohol/week: 0.0 standard drinks  . Drug use: No  . Sexual activity: Yes  Other Topics Concern  . Not on file  Social History Narrative   Patient lives at home with husband and son. Nicole Kindred)   Patient has 2 children.    Patient is currently working as an Therapist, sports.   Patient has a college education.    Patient is right handed.    Consumes drinks 1 16oz soda 3 times a week, rarely drinks coffee.    Works night shift   Social Determinants of Radio broadcast assistant Strain: Not on file  Food Insecurity: Not on file  Transportation Needs: Not on file  Physical Activity: Not on  file  Stress: Not on file  Social Connections: Not on file  Intimate Partner Violence: Not on file   Family History  Problem Relation Age of Onset  . Cancer - Prostate Father   . Hypertension Father   . Hypertension Mother   . Diabetes Mother   . Heart attack Neg Hx   . Hyperlipidemia Neg Hx   . Sudden death Neg Hx    Past Surgical History:  Procedure Laterality Date  . ANTERIOR AND POSTERIOR REPAIR N/A 10/10/2015   Procedure: ANTERIOR (CYSTOCELE) AND POSTERIOR REPAIR (RECTOCELE);  Surgeon: Bjorn Loser, MD;  Location: [facility];  Service: Urology;  Laterality: N/A;  . CARDIOVASCULAR STRESS TEST    . CESAREAN SECTION  53yrs ago  . CHOLECYSTECTOMY    . CYSTO N/A 10/10/2015   Procedure: CYSTO;  Surgeon: Bjorn Loser, MD;  Location: [facility];  Service: Urology;  Laterality: N/A;  . GALLBLADDER SURGERY  59yrs ago  . SINUS ENDO W/FUSION Bilateral 02/03/2013   Procedure: ENDOSCOPIC SINUS SURGERY WITH FUSION NAVIGATION;  Surgeon: Ruby Cola, MD;  Location: [facility];  Service: ENT;  Laterality: Bilateral;  Repaired CSF Leak  . US ECHOCARDIOGRAPHY    . VAGINAL HYSTERECTOMY N/A 10/10/2015   Procedure: HYSTERECTOMY VAGINAL;  Surgeon: Servando Salina, MD;  Location: [facility];  Service: Gynecology;  Laterality: N/A;  . VAGINAL PROLAPSE REPAIR N/A 10/10/2015   Procedure: VAULT PROLAPSE AND GRAFT;  Surgeon: Bjorn Loser, MD;  Location: [facility];  Service: Urology;  Laterality: N/AVanessa Kick, MD 10/12/20 [phone]

## 2020-10-13 LAB — NOVEL CORONAVIRUS, NAA: SARS-CoV-2, NAA: NOT DETECTED

## 2020-10-13 LAB — SARS-COV-2, NAA 2 DAY TAT

## 2020-11-11 DIAGNOSIS — M542 Cervicalgia: Secondary | ICD-10-CM | POA: Diagnosis not present

## 2020-11-11 DIAGNOSIS — S46011A Strain of muscle(s) and tendon(s) of the rotator cuff of right shoulder, initial encounter: Secondary | ICD-10-CM | POA: Diagnosis not present

## 2020-11-14 ENCOUNTER — Other Ambulatory Visit (HOSPITAL_COMMUNITY): Payer: Self-pay | Admitting: Internal Medicine

## 2020-11-14 ENCOUNTER — Other Ambulatory Visit (HOSPITAL_COMMUNITY): Payer: Self-pay

## 2020-11-14 MED FILL — Nebivolol HCl Tab 5 MG (Base Equivalent): ORAL | 85 days supply | Qty: 85 | Fill #0 | Status: CN

## 2020-11-14 MED FILL — Nebivolol HCl Tab 5 MG (Base Equivalent): ORAL | 5 days supply | Qty: 5 | Fill #0 | Status: CN

## 2020-11-14 MED FILL — Acetazolamide Cap ER 12HR 500 MG: ORAL | 90 days supply | Qty: 180 | Fill #0 | Status: AC

## 2020-11-14 MED FILL — Nebivolol HCl Tab 5 MG (Base Equivalent): ORAL | 90 days supply | Qty: 90 | Fill #0 | Status: AC

## 2020-11-15 ENCOUNTER — Other Ambulatory Visit (HOSPITAL_COMMUNITY): Payer: Self-pay

## 2020-11-16 ENCOUNTER — Other Ambulatory Visit (HOSPITAL_COMMUNITY): Payer: Self-pay

## 2020-11-18 ENCOUNTER — Other Ambulatory Visit (HOSPITAL_COMMUNITY): Payer: Self-pay

## 2020-11-19 ENCOUNTER — Other Ambulatory Visit (HOSPITAL_COMMUNITY): Payer: Self-pay

## 2020-11-20 ENCOUNTER — Other Ambulatory Visit (HOSPITAL_COMMUNITY): Payer: Self-pay

## 2020-11-25 ENCOUNTER — Other Ambulatory Visit: Payer: Self-pay

## 2020-11-25 ENCOUNTER — Encounter: Payer: Self-pay | Admitting: Podiatry

## 2020-11-25 ENCOUNTER — Ambulatory Visit: Payer: 59 | Admitting: Podiatry

## 2020-11-25 ENCOUNTER — Ambulatory Visit (INDEPENDENT_AMBULATORY_CARE_PROVIDER_SITE_OTHER): Payer: 59

## 2020-11-25 DIAGNOSIS — M722 Plantar fascial fibromatosis: Secondary | ICD-10-CM

## 2020-11-25 NOTE — Patient Instructions (Signed)

## 2020-11-26 ENCOUNTER — Other Ambulatory Visit (HOSPITAL_COMMUNITY): Payer: Self-pay

## 2020-11-26 ENCOUNTER — Encounter: Payer: Self-pay | Admitting: Podiatry

## 2020-11-26 MED ORDER — DICLOFENAC SODIUM 75 MG PO TBEC
75.0000 mg | DELAYED_RELEASE_TABLET | Freq: Two times a day (BID) | ORAL | 0 refills | Status: AC
  Filled 2020-11-26: qty 56, 28d supply, fill #0

## 2020-11-26 NOTE — Progress Notes (Signed)
  Subjective:  Patient ID: [name], female    DOB: [birth year],  MRN: [id number]  Chief Complaint  Patient presents with  . Plantar Fasciitis    Right heel pain    55 y.o. female presents with the above complaint. History confirmed with patient.  She said this before on the same side.  He was treated successfully with injections and stretching and anti-inflammatories with Dr. Paulla Dolly.  Started to return.  She works as an Therapist, sports at [employer]  Objective:  Physical Exam: warm, good capillary refill, no trophic changes or ulcerative lesions, normal DP and PT pulses and normal sensory exam.   Right Foot: Pain on palpation to plantar heel  Radiographs: X-ray of the right foot: Plantar calcaneal spur noted Assessment:   1. Plantar fasciitis      Plan:  Patient was evaluated and treated and all questions answered.  Discussed the etiology and treatment options for plantar fasciitis including stretching, formal physical therapy, supportive shoegears such as a running shoe or sneaker, pre fabricated orthoses, injection therapy, and oral medications. We also discussed the role of surgical treatment of this for patients who do not improve after exhausting non-surgical treatment options.   -XR reviewed with patient -Educated patient on stretching and icing of the affected limb -Injection delivered to the plantar fascia of the right foot. -Rx for diclofenac, she has been taking this has been helpful, refill sent. Educated on use, risks and benefits of the medication   Return in about 1 month (around 12/26/2020) for recheck plantar fasciitis.

## 2020-12-17 ENCOUNTER — Other Ambulatory Visit (HOSPITAL_COMMUNITY): Payer: Self-pay

## 2020-12-17 MED FILL — Triamterene & Hydrochlorothiazide Tab 75-50 MG: ORAL | 90 days supply | Qty: 90 | Fill #0 | Status: CN

## 2020-12-17 MED FILL — Venlafaxine HCl Cap ER 24HR 75 MG (Base Equivalent): ORAL | 90 days supply | Qty: 90 | Fill #0 | Status: CN

## 2020-12-20 ENCOUNTER — Other Ambulatory Visit (HOSPITAL_COMMUNITY): Payer: Self-pay

## 2020-12-24 DIAGNOSIS — G4733 Obstructive sleep apnea (adult) (pediatric): Secondary | ICD-10-CM | POA: Diagnosis not present

## 2020-12-25 ENCOUNTER — Other Ambulatory Visit (HOSPITAL_COMMUNITY): Payer: Self-pay

## 2020-12-25 ENCOUNTER — Other Ambulatory Visit (HOSPITAL_COMMUNITY): Payer: Self-pay | Admitting: Internal Medicine

## 2020-12-25 MED FILL — Magnesium Oxide Tab 400 MG (240 MG Elemental Mg): ORAL | 90 days supply | Qty: 180 | Fill #0 | Status: AC

## 2020-12-25 MED FILL — Cholecalciferol Cap 1.25 MG (50000 Unit): ORAL | 84 days supply | Qty: 12 | Fill #0 | Status: AC

## 2020-12-26 ENCOUNTER — Other Ambulatory Visit (HOSPITAL_COMMUNITY): Payer: Self-pay

## 2020-12-26 DIAGNOSIS — I208 Other forms of angina pectoris: Secondary | ICD-10-CM | POA: Diagnosis not present

## 2020-12-26 DIAGNOSIS — H65111 Acute and subacute allergic otitis media (mucoid) (sanguinous) (serous), right ear: Secondary | ICD-10-CM | POA: Diagnosis not present

## 2020-12-26 DIAGNOSIS — E782 Mixed hyperlipidemia: Secondary | ICD-10-CM | POA: Diagnosis not present

## 2020-12-26 DIAGNOSIS — J45991 Cough variant asthma: Secondary | ICD-10-CM | POA: Diagnosis not present

## 2020-12-26 DIAGNOSIS — J301 Allergic rhinitis due to pollen: Secondary | ICD-10-CM | POA: Diagnosis not present

## 2020-12-26 DIAGNOSIS — I10 Essential (primary) hypertension: Secondary | ICD-10-CM | POA: Diagnosis not present

## 2020-12-26 DIAGNOSIS — G4700 Insomnia, unspecified: Secondary | ICD-10-CM | POA: Diagnosis not present

## 2020-12-26 DIAGNOSIS — R7303 Prediabetes: Secondary | ICD-10-CM | POA: Diagnosis not present

## 2020-12-26 DIAGNOSIS — G932 Benign intracranial hypertension: Secondary | ICD-10-CM | POA: Diagnosis not present

## 2020-12-26 DIAGNOSIS — E785 Hyperlipidemia, unspecified: Secondary | ICD-10-CM | POA: Diagnosis not present

## 2020-12-26 DIAGNOSIS — Z6841 Body Mass Index (BMI) 40.0 and over, adult: Secondary | ICD-10-CM | POA: Diagnosis not present

## 2020-12-26 DIAGNOSIS — E559 Vitamin D deficiency, unspecified: Secondary | ICD-10-CM | POA: Diagnosis not present

## 2020-12-26 DIAGNOSIS — G4733 Obstructive sleep apnea (adult) (pediatric): Secondary | ICD-10-CM | POA: Diagnosis not present

## 2020-12-26 DIAGNOSIS — G479 Sleep disorder, unspecified: Secondary | ICD-10-CM | POA: Diagnosis not present

## 2020-12-26 DIAGNOSIS — K219 Gastro-esophageal reflux disease without esophagitis: Secondary | ICD-10-CM | POA: Diagnosis not present

## 2020-12-26 MED ORDER — AZITHROMYCIN 250 MG PO TABS
ORAL_TABLET | ORAL | 0 refills | Status: DC
  Filled 2020-12-26: qty 6, 5d supply, fill #0

## 2020-12-26 MED ORDER — MAGNESIUM OXIDE -MG SUPPLEMENT 400 (240 MG) MG PO TABS: 1.0000 | ORAL_TABLET | Freq: Two times a day (BID) | ORAL | 3 refills | Status: DC

## 2020-12-26 MED ORDER — HYDROXYZINE HCL 25 MG PO TABS
25.0000 mg | ORAL_TABLET | Freq: Every evening | ORAL | 2 refills | Status: DC | PRN
  Filled 2020-12-26: qty 90, 90d supply, fill #0
  Filled 2021-06-19: qty 90, 90d supply, fill #1

## 2020-12-26 MED FILL — Triamterene & Hydrochlorothiazide Tab 75-50 MG: ORAL | 90 days supply | Qty: 90 | Fill #0 | Status: AC

## 2020-12-26 MED FILL — Venlafaxine HCl Cap ER 24HR 75 MG (Base Equivalent): ORAL | 90 days supply | Qty: 90 | Fill #0 | Status: AC

## 2020-12-27 ENCOUNTER — Other Ambulatory Visit (HOSPITAL_COMMUNITY): Payer: Self-pay

## 2020-12-30 ENCOUNTER — Other Ambulatory Visit (HOSPITAL_COMMUNITY): Payer: Self-pay

## 2020-12-30 MED ORDER — ALBUTEROL SULFATE HFA 108 (90 BASE) MCG/ACT IN AERS
INHALATION_SPRAY | RESPIRATORY_TRACT | 3 refills | Status: DC
  Filled 2020-12-30: qty 18, 16d supply, fill #0
  Filled 2021-12-29: qty 6.7, 16d supply, fill #0

## 2020-12-31 ENCOUNTER — Ambulatory Visit: Payer: 59 | Admitting: Podiatry

## 2020-12-31 ENCOUNTER — Other Ambulatory Visit (HOSPITAL_COMMUNITY): Payer: Self-pay

## 2021-01-04 ENCOUNTER — Other Ambulatory Visit (HOSPITAL_COMMUNITY): Payer: Self-pay

## 2021-01-04 MED FILL — Rizatriptan Benzoate Tab 10 MG (Base Equivalent): ORAL | 30 days supply | Qty: 10 | Fill #0 | Status: CN

## 2021-01-08 ENCOUNTER — Other Ambulatory Visit (HOSPITAL_COMMUNITY): Payer: Self-pay

## 2021-01-14 ENCOUNTER — Other Ambulatory Visit (HOSPITAL_COMMUNITY): Payer: Self-pay

## 2021-01-16 DIAGNOSIS — I10 Essential (primary) hypertension: Secondary | ICD-10-CM | POA: Diagnosis not present

## 2021-01-16 DIAGNOSIS — R7303 Prediabetes: Secondary | ICD-10-CM | POA: Diagnosis not present

## 2021-01-16 DIAGNOSIS — E782 Mixed hyperlipidemia: Secondary | ICD-10-CM | POA: Diagnosis not present

## 2021-01-16 DIAGNOSIS — H65111 Acute and subacute allergic otitis media (mucoid) (sanguinous) (serous), right ear: Secondary | ICD-10-CM | POA: Diagnosis not present

## 2021-01-21 ENCOUNTER — Other Ambulatory Visit: Payer: Self-pay | Admitting: Internal Medicine

## 2021-01-21 DIAGNOSIS — Z1231 Encounter for screening mammogram for malignant neoplasm of breast: Secondary | ICD-10-CM

## 2021-02-07 ENCOUNTER — Other Ambulatory Visit (HOSPITAL_COMMUNITY): Payer: Self-pay

## 2021-02-07 MED ORDER — MONTELUKAST SODIUM 10 MG PO TABS
ORAL_TABLET | Freq: Every day | ORAL | 2 refills | Status: DC
  Filled 2021-02-07: qty 90, 90d supply, fill #0
  Filled 2021-05-12: qty 90, 90d supply, fill #1
  Filled 2021-08-25: qty 90, 90d supply, fill #2

## 2021-02-07 MED ORDER — POTASSIUM CHLORIDE ER 20 MEQ PO TBCR
EXTENDED_RELEASE_TABLET | ORAL | 3 refills | Status: DC
  Filled 2021-02-07: qty 180, 90d supply, fill #0

## 2021-02-07 MED FILL — Rizatriptan Benzoate Tab 10 MG (Base Equivalent): ORAL | 30 days supply | Qty: 10 | Fill #0 | Status: AC

## 2021-02-07 MED FILL — Nebivolol HCl Tab 5 MG (Base Equivalent): ORAL | 90 days supply | Qty: 90 | Fill #1 | Status: AC

## 2021-02-08 ENCOUNTER — Other Ambulatory Visit (HOSPITAL_COMMUNITY): Payer: Self-pay

## 2021-02-10 ENCOUNTER — Other Ambulatory Visit (HOSPITAL_COMMUNITY): Payer: Self-pay

## 2021-02-11 ENCOUNTER — Other Ambulatory Visit (HOSPITAL_COMMUNITY): Payer: Self-pay

## 2021-02-19 ENCOUNTER — Ambulatory Visit: Payer: 59 | Admitting: Neurology

## 2021-02-19 ENCOUNTER — Encounter: Payer: Self-pay | Admitting: Neurology

## 2021-02-19 ENCOUNTER — Telehealth: Payer: Self-pay | Admitting: Neurology

## 2021-02-19 NOTE — Telephone Encounter (Signed)
This patient did not show for a revisit visit today.  The patient also no showed in March on the 17th, 2022.

## 2021-03-19 ENCOUNTER — Other Ambulatory Visit (HOSPITAL_COMMUNITY): Payer: Self-pay

## 2021-03-19 ENCOUNTER — Other Ambulatory Visit (HOSPITAL_COMMUNITY): Payer: Self-pay | Admitting: Internal Medicine

## 2021-03-19 MED FILL — Cholecalciferol Cap 1.25 MG (50000 Unit): ORAL | 84 days supply | Qty: 12 | Fill #1 | Status: AC

## 2021-03-19 MED FILL — Chlorhexidine Gluconate Soln 0.12%: OROMUCOSAL | 30 days supply | Qty: 473 | Fill #0 | Status: AC

## 2021-03-19 MED FILL — Venlafaxine HCl Cap ER 24HR 75 MG (Base Equivalent): ORAL | 90 days supply | Qty: 90 | Fill #1 | Status: AC

## 2021-03-19 MED FILL — Acetazolamide Cap ER 12HR 500 MG: ORAL | 90 days supply | Qty: 180 | Fill #1 | Status: AC

## 2021-03-19 MED FILL — Magnesium Oxide Tab 400 MG (240 MG Elemental Mg): ORAL | 90 days supply | Qty: 180 | Fill #1 | Status: AC

## 2021-03-20 DIAGNOSIS — G4733 Obstructive sleep apnea (adult) (pediatric): Secondary | ICD-10-CM | POA: Diagnosis not present

## 2021-03-24 ENCOUNTER — Other Ambulatory Visit (HOSPITAL_COMMUNITY): Payer: Self-pay

## 2021-03-24 DIAGNOSIS — L400 Psoriasis vulgaris: Secondary | ICD-10-CM | POA: Diagnosis not present

## 2021-03-24 DIAGNOSIS — L218 Other seborrheic dermatitis: Secondary | ICD-10-CM | POA: Diagnosis not present

## 2021-03-24 MED ORDER — MOMETASONE FUROATE 0.1 % EX CREA
TOPICAL_CREAM | CUTANEOUS | 3 refills | Status: DC
  Filled 2021-03-24: qty 45, 14d supply, fill #0
  Filled 2021-12-29: qty 45, 14d supply, fill #1

## 2021-03-24 MED ORDER — TRIAMTERENE-HCTZ 75-50 MG PO TABS
ORAL_TABLET | ORAL | 3 refills | Status: DC
  Filled 2021-03-24: qty 90, 90d supply, fill #0
  Filled 2021-06-19: qty 90, 90d supply, fill #1
  Filled 2021-09-18: qty 90, 90d supply, fill #2
  Filled 2021-12-11: qty 90, 90d supply, fill #3

## 2021-03-24 MED ORDER — FLUOCINONIDE 0.05 % EX SOLN
CUTANEOUS | 4 refills | Status: DC
  Filled 2021-03-24: qty 60, 30d supply, fill #0
  Filled 2021-05-12: qty 60, 30d supply, fill #1
  Filled 2021-08-25: qty 60, 30d supply, fill #2
  Filled 2021-09-18: qty 60, 30d supply, fill #3
  Filled 2021-12-29: qty 60, 30d supply, fill #4

## 2021-03-25 ENCOUNTER — Other Ambulatory Visit (HOSPITAL_COMMUNITY): Payer: Self-pay

## 2021-03-31 ENCOUNTER — Other Ambulatory Visit: Payer: Self-pay

## 2021-03-31 ENCOUNTER — Ambulatory Visit
Admission: RE | Admit: 2021-03-31 | Discharge: 2021-03-31 | Disposition: A | Discharge status: Home / Self Care | Payer: 59 | Source: Ambulatory Visit | Attending: Internal Medicine | Admitting: Internal Medicine

## 2021-03-31 ENCOUNTER — Ambulatory Visit: Payer: 59

## 2021-03-31 DIAGNOSIS — Z1231 Encounter for screening mammogram for malignant neoplasm of breast: Secondary | ICD-10-CM

## 2021-05-12 ENCOUNTER — Other Ambulatory Visit (HOSPITAL_COMMUNITY): Payer: Self-pay

## 2021-05-15 ENCOUNTER — Other Ambulatory Visit (HOSPITAL_COMMUNITY): Payer: Self-pay

## 2021-05-15 DIAGNOSIS — K219 Gastro-esophageal reflux disease without esophagitis: Secondary | ICD-10-CM | POA: Diagnosis not present

## 2021-05-15 DIAGNOSIS — K5900 Constipation, unspecified: Secondary | ICD-10-CM | POA: Diagnosis not present

## 2021-05-15 DIAGNOSIS — K625 Hemorrhage of anus and rectum: Secondary | ICD-10-CM | POA: Diagnosis not present

## 2021-05-15 MED ORDER — PANTOPRAZOLE SODIUM 40 MG PO TBEC
DELAYED_RELEASE_TABLET | ORAL | 4 refills | Status: DC
  Filled 2021-05-15: qty 90, 90d supply, fill #0
  Filled 2021-08-25: qty 90, 90d supply, fill #1
  Filled 2021-12-06: qty 90, 90d supply, fill #2
  Filled 2022-03-20: qty 90, 90d supply, fill #3
  Filled 2022-04-30: qty 90, 90d supply, fill #4

## 2021-05-28 ENCOUNTER — Other Ambulatory Visit (HOSPITAL_COMMUNITY): Payer: Self-pay | Admitting: Internal Medicine

## 2021-05-28 ENCOUNTER — Other Ambulatory Visit (HOSPITAL_COMMUNITY): Payer: Self-pay

## 2021-06-03 ENCOUNTER — Other Ambulatory Visit (HOSPITAL_COMMUNITY): Payer: Self-pay

## 2021-06-03 MED ORDER — NEBIVOLOL HCL 5 MG PO TABS
ORAL_TABLET | ORAL | 3 refills | Status: DC
  Filled 2021-06-03 – 2022-03-20 (×2): qty 90, 90d supply, fill #0
  Filled 2022-04-30: qty 90, 90d supply, fill #1

## 2021-06-03 MED ORDER — NEBIVOLOL HCL 5 MG PO TABS
ORAL_TABLET | ORAL | 3 refills | Status: DC
  Filled 2021-06-03: qty 90, 90d supply, fill #0
  Filled 2021-08-25: qty 90, 90d supply, fill #1
  Filled 2021-12-06: qty 90, 90d supply, fill #2

## 2021-06-13 DIAGNOSIS — G4733 Obstructive sleep apnea (adult) (pediatric): Secondary | ICD-10-CM | POA: Diagnosis not present

## 2021-06-19 ENCOUNTER — Other Ambulatory Visit (HOSPITAL_COMMUNITY): Payer: Self-pay

## 2021-06-19 MED FILL — Cholecalciferol Cap 1.25 MG (50000 Unit): ORAL | 84 days supply | Qty: 12 | Fill #2 | Status: AC

## 2021-06-19 MED FILL — Venlafaxine HCl Cap ER 24HR 75 MG (Base Equivalent): ORAL | 90 days supply | Qty: 90 | Fill #2 | Status: CN

## 2021-06-19 MED FILL — Acetazolamide Cap ER 12HR 500 MG: ORAL | 90 days supply | Qty: 180 | Fill #2 | Status: CN

## 2021-06-20 ENCOUNTER — Other Ambulatory Visit (HOSPITAL_COMMUNITY): Payer: Self-pay

## 2021-06-24 ENCOUNTER — Other Ambulatory Visit (HOSPITAL_COMMUNITY): Payer: Self-pay

## 2021-06-24 ENCOUNTER — Other Ambulatory Visit: Payer: Self-pay | Admitting: Neurology

## 2021-06-26 ENCOUNTER — Other Ambulatory Visit (HOSPITAL_COMMUNITY): Payer: Self-pay

## 2021-06-26 DIAGNOSIS — G932 Benign intracranial hypertension: Secondary | ICD-10-CM | POA: Diagnosis not present

## 2021-06-26 DIAGNOSIS — Z6841 Body Mass Index (BMI) 40.0 and over, adult: Secondary | ICD-10-CM | POA: Diagnosis not present

## 2021-06-26 DIAGNOSIS — J018 Other acute sinusitis: Secondary | ICD-10-CM | POA: Diagnosis not present

## 2021-06-26 DIAGNOSIS — K5900 Constipation, unspecified: Secondary | ICD-10-CM | POA: Diagnosis not present

## 2021-06-26 DIAGNOSIS — G4733 Obstructive sleep apnea (adult) (pediatric): Secondary | ICD-10-CM | POA: Diagnosis not present

## 2021-06-26 DIAGNOSIS — G43009 Migraine without aura, not intractable, without status migrainosus: Secondary | ICD-10-CM | POA: Diagnosis not present

## 2021-06-26 DIAGNOSIS — E782 Mixed hyperlipidemia: Secondary | ICD-10-CM | POA: Diagnosis not present

## 2021-06-26 DIAGNOSIS — K219 Gastro-esophageal reflux disease without esophagitis: Secondary | ICD-10-CM | POA: Diagnosis not present

## 2021-06-26 DIAGNOSIS — I10 Essential (primary) hypertension: Secondary | ICD-10-CM | POA: Diagnosis not present

## 2021-06-26 DIAGNOSIS — J301 Allergic rhinitis due to pollen: Secondary | ICD-10-CM | POA: Diagnosis not present

## 2021-06-26 DIAGNOSIS — R7303 Prediabetes: Secondary | ICD-10-CM | POA: Diagnosis not present

## 2021-06-26 MED ORDER — VENLAFAXINE HCL ER 75 MG PO CP24
ORAL_CAPSULE | ORAL | 1 refills | Status: DC
  Filled 2021-06-26: qty 30, 30d supply, fill #0
  Filled 2021-08-12: qty 30, 30d supply, fill #1

## 2021-06-26 MED ORDER — AMOXICILLIN-POT CLAVULANATE 500-125 MG PO TABS
ORAL_TABLET | ORAL | 1 refills | Status: DC
  Filled 2021-06-26: qty 20, 10d supply, fill #0
  Filled 2021-07-28: qty 20, 10d supply, fill #1

## 2021-06-26 MED ORDER — AZITHROMYCIN 250 MG PO TABS
ORAL_TABLET | ORAL | 0 refills | Status: DC
  Filled 2021-06-26: qty 6, 5d supply, fill #0

## 2021-06-26 MED ORDER — ACETAZOLAMIDE ER 500 MG PO CP12
ORAL_CAPSULE | ORAL | 1 refills | Status: DC
  Filled 2021-06-26: qty 60, 30d supply, fill #0

## 2021-06-27 ENCOUNTER — Other Ambulatory Visit (HOSPITAL_COMMUNITY): Payer: Self-pay

## 2021-07-01 ENCOUNTER — Other Ambulatory Visit (HOSPITAL_COMMUNITY): Payer: Self-pay

## 2021-07-01 DIAGNOSIS — N8111 Cystocele, midline: Secondary | ICD-10-CM | POA: Diagnosis not present

## 2021-07-01 DIAGNOSIS — N762 Acute vulvitis: Secondary | ICD-10-CM | POA: Diagnosis not present

## 2021-07-01 DIAGNOSIS — Z01419 Encounter for gynecological examination (general) (routine) without abnormal findings: Secondary | ICD-10-CM | POA: Diagnosis not present

## 2021-07-01 MED ORDER — NYSTATIN-TRIAMCINOLONE 100000-0.1 UNIT/GM-% EX CREA
TOPICAL_CREAM | CUTANEOUS | 1 refills | Status: DC
  Filled 2021-07-01: qty 30, 7d supply, fill #0
  Filled 2021-12-29: qty 30, 7d supply, fill #1

## 2021-07-14 DIAGNOSIS — M545 Low back pain, unspecified: Secondary | ICD-10-CM | POA: Diagnosis not present

## 2021-07-14 DIAGNOSIS — M1711 Unilateral primary osteoarthritis, right knee: Secondary | ICD-10-CM | POA: Diagnosis not present

## 2021-07-28 ENCOUNTER — Telehealth: Payer: Self-pay | Admitting: Neurology

## 2021-07-28 ENCOUNTER — Other Ambulatory Visit (HOSPITAL_COMMUNITY): Payer: Self-pay

## 2021-07-28 MED FILL — Magnesium Oxide Tab 400 MG (240 MG Elemental Mg): ORAL | 90 days supply | Qty: 180 | Fill #2 | Status: AC

## 2021-07-28 NOTE — Telephone Encounter (Signed)
Pt would like a call from the nurse to discuss prescription refills  for acetaZOLAMIDE ER (DIAMOX) 500 MG capsule

## 2021-07-29 ENCOUNTER — Other Ambulatory Visit (HOSPITAL_COMMUNITY): Payer: Self-pay

## 2021-07-29 MED ORDER — ACETAZOLAMIDE ER 500 MG PO CP12
ORAL_CAPSULE | ORAL | 0 refills | Status: DC
  Filled 2021-07-29: qty 60, 30d supply, fill #0

## 2021-07-29 MED ORDER — RIZATRIPTAN BENZOATE 10 MG PO TABS
ORAL_TABLET | ORAL | 0 refills | Status: DC
  Filled 2021-07-29: qty 10, 30d supply, fill #0

## 2021-07-29 NOTE — Telephone Encounter (Signed)
I called patient. She needs refill on diamox and maxalt sent to [facility]. She has not been seen in over one year. However, she scheduled the soonest available appointment with Judson Roch, NP and has been placed on the waitlist. I encouraged her to call and check for sooner availability if she has not gotten a sooner appointment within a few weeks. Pt verbalized understanding.  I will refill the diamox and maxalt x 1 month.

## 2021-08-11 DIAGNOSIS — R7303 Prediabetes: Secondary | ICD-10-CM | POA: Diagnosis not present

## 2021-08-11 DIAGNOSIS — J018 Other acute sinusitis: Secondary | ICD-10-CM | POA: Diagnosis not present

## 2021-08-11 DIAGNOSIS — G479 Sleep disorder, unspecified: Secondary | ICD-10-CM | POA: Diagnosis not present

## 2021-08-11 DIAGNOSIS — G4733 Obstructive sleep apnea (adult) (pediatric): Secondary | ICD-10-CM | POA: Diagnosis not present

## 2021-08-11 DIAGNOSIS — K219 Gastro-esophageal reflux disease without esophagitis: Secondary | ICD-10-CM | POA: Diagnosis not present

## 2021-08-11 DIAGNOSIS — E782 Mixed hyperlipidemia: Secondary | ICD-10-CM | POA: Diagnosis not present

## 2021-08-11 DIAGNOSIS — J301 Allergic rhinitis due to pollen: Secondary | ICD-10-CM | POA: Diagnosis not present

## 2021-08-11 DIAGNOSIS — F0630 Mood disorder due to known physiological condition, unspecified: Secondary | ICD-10-CM | POA: Diagnosis not present

## 2021-08-11 DIAGNOSIS — G932 Benign intracranial hypertension: Secondary | ICD-10-CM | POA: Diagnosis not present

## 2021-08-11 DIAGNOSIS — M170 Bilateral primary osteoarthritis of knee: Secondary | ICD-10-CM | POA: Diagnosis not present

## 2021-08-11 DIAGNOSIS — Z6841 Body Mass Index (BMI) 40.0 and over, adult: Secondary | ICD-10-CM | POA: Diagnosis not present

## 2021-08-11 DIAGNOSIS — I10 Essential (primary) hypertension: Secondary | ICD-10-CM | POA: Diagnosis not present

## 2021-08-11 DIAGNOSIS — G4710 Hypersomnia, unspecified: Secondary | ICD-10-CM | POA: Diagnosis not present

## 2021-08-12 ENCOUNTER — Other Ambulatory Visit (HOSPITAL_COMMUNITY): Payer: Self-pay

## 2021-08-15 ENCOUNTER — Other Ambulatory Visit (HOSPITAL_COMMUNITY): Payer: Self-pay

## 2021-08-15 DIAGNOSIS — M5432 Sciatica, left side: Secondary | ICD-10-CM | POA: Diagnosis not present

## 2021-08-15 DIAGNOSIS — S8002XA Contusion of left knee, initial encounter: Secondary | ICD-10-CM | POA: Diagnosis not present

## 2021-08-15 MED ORDER — METHYLPREDNISOLONE 4 MG PO TBPK
ORAL_TABLET | ORAL | 0 refills | Status: DC
  Filled 2021-08-15: qty 21, 6d supply, fill #0

## 2021-08-25 ENCOUNTER — Other Ambulatory Visit (HOSPITAL_COMMUNITY): Payer: Self-pay

## 2021-08-25 ENCOUNTER — Other Ambulatory Visit: Payer: Self-pay | Admitting: Neurology

## 2021-08-25 DIAGNOSIS — L400 Psoriasis vulgaris: Secondary | ICD-10-CM | POA: Diagnosis not present

## 2021-08-25 MED ORDER — ACETAZOLAMIDE ER 500 MG PO CP12
ORAL_CAPSULE | ORAL | 0 refills | Status: DC
  Filled 2021-08-25: qty 60, 30d supply, fill #0

## 2021-08-25 MED ORDER — VENLAFAXINE HCL ER 75 MG PO CP24
ORAL_CAPSULE | ORAL | 2 refills | Status: DC
  Filled 2021-08-25 – 2021-09-08 (×2): qty 30, 30d supply, fill #0
  Filled 2021-10-13: qty 30, 30d supply, fill #1
  Filled 2021-11-06: qty 30, 30d supply, fill #2

## 2021-08-25 MED ORDER — FLUOCINONIDE 0.05 % EX CREA
TOPICAL_CREAM | CUTANEOUS | 1 refills | Status: DC
  Filled 2021-08-25: qty 30, 15d supply, fill #0
  Filled 2021-12-29: qty 30, 15d supply, fill #1

## 2021-08-26 ENCOUNTER — Other Ambulatory Visit (HOSPITAL_COMMUNITY): Payer: Self-pay

## 2021-09-04 ENCOUNTER — Other Ambulatory Visit (HOSPITAL_COMMUNITY): Payer: Self-pay

## 2021-09-04 DIAGNOSIS — S83281A Other tear of lateral meniscus, current injury, right knee, initial encounter: Secondary | ICD-10-CM | POA: Diagnosis not present

## 2021-09-04 MED ORDER — METHYLPREDNISOLONE 4 MG PO TBPK
ORAL_TABLET | ORAL | 0 refills | Status: DC
  Filled 2021-09-04: qty 21, 6d supply, fill #0

## 2021-09-05 DIAGNOSIS — G4733 Obstructive sleep apnea (adult) (pediatric): Secondary | ICD-10-CM | POA: Diagnosis not present

## 2021-09-08 ENCOUNTER — Other Ambulatory Visit (HOSPITAL_COMMUNITY): Payer: Self-pay

## 2021-09-11 DIAGNOSIS — M25561 Pain in right knee: Secondary | ICD-10-CM | POA: Diagnosis not present

## 2021-09-17 DIAGNOSIS — S83281A Other tear of lateral meniscus, current injury, right knee, initial encounter: Secondary | ICD-10-CM | POA: Diagnosis not present

## 2021-09-17 DIAGNOSIS — S83241A Other tear of medial meniscus, current injury, right knee, initial encounter: Secondary | ICD-10-CM | POA: Diagnosis not present

## 2021-09-18 ENCOUNTER — Other Ambulatory Visit (HOSPITAL_COMMUNITY): Payer: Self-pay

## 2021-09-18 MED ORDER — VITAMIN D3 1.25 MG (50000 UT) PO CAPS
1.0000 | ORAL_CAPSULE | ORAL | 3 refills | Status: DC
  Filled 2021-09-18: qty 12, 84d supply, fill #0
  Filled 2021-12-11: qty 12, 84d supply, fill #1

## 2021-09-18 MED ORDER — POTASSIUM CHLORIDE CRYS ER 20 MEQ PO TBCR
20.0000 meq | EXTENDED_RELEASE_TABLET | Freq: Every day | ORAL | 3 refills | Status: DC
  Filled 2021-09-18: qty 90, 90d supply, fill #0

## 2021-09-24 DIAGNOSIS — S83271D Complex tear of lateral meniscus, current injury, right knee, subsequent encounter: Secondary | ICD-10-CM | POA: Diagnosis not present

## 2021-09-24 DIAGNOSIS — R262 Difficulty in walking, not elsewhere classified: Secondary | ICD-10-CM | POA: Diagnosis not present

## 2021-09-24 DIAGNOSIS — M25661 Stiffness of right knee, not elsewhere classified: Secondary | ICD-10-CM | POA: Diagnosis not present

## 2021-09-24 DIAGNOSIS — M6281 Muscle weakness (generalized): Secondary | ICD-10-CM | POA: Diagnosis not present

## 2021-09-24 DIAGNOSIS — S83231D Complex tear of medial meniscus, current injury, right knee, subsequent encounter: Secondary | ICD-10-CM | POA: Diagnosis not present

## 2021-09-30 DIAGNOSIS — E785 Hyperlipidemia, unspecified: Secondary | ICD-10-CM | POA: Diagnosis not present

## 2021-09-30 DIAGNOSIS — G4709 Other insomnia: Secondary | ICD-10-CM | POA: Diagnosis not present

## 2021-09-30 DIAGNOSIS — Z6841 Body Mass Index (BMI) 40.0 and over, adult: Secondary | ICD-10-CM | POA: Diagnosis not present

## 2021-09-30 DIAGNOSIS — M170 Bilateral primary osteoarthritis of knee: Secondary | ICD-10-CM | POA: Diagnosis not present

## 2021-09-30 DIAGNOSIS — F0630 Mood disorder due to known physiological condition, unspecified: Secondary | ICD-10-CM | POA: Diagnosis not present

## 2021-09-30 DIAGNOSIS — E559 Vitamin D deficiency, unspecified: Secondary | ICD-10-CM | POA: Diagnosis not present

## 2021-09-30 DIAGNOSIS — G932 Benign intracranial hypertension: Secondary | ICD-10-CM | POA: Diagnosis not present

## 2021-09-30 DIAGNOSIS — I10 Essential (primary) hypertension: Secondary | ICD-10-CM | POA: Diagnosis not present

## 2021-09-30 DIAGNOSIS — G4733 Obstructive sleep apnea (adult) (pediatric): Secondary | ICD-10-CM | POA: Diagnosis not present

## 2021-09-30 DIAGNOSIS — K219 Gastro-esophageal reflux disease without esophagitis: Secondary | ICD-10-CM | POA: Diagnosis not present

## 2021-09-30 DIAGNOSIS — R7303 Prediabetes: Secondary | ICD-10-CM | POA: Diagnosis not present

## 2021-09-30 DIAGNOSIS — I208 Other forms of angina pectoris: Secondary | ICD-10-CM | POA: Diagnosis not present

## 2021-10-06 DIAGNOSIS — M6281 Muscle weakness (generalized): Secondary | ICD-10-CM | POA: Diagnosis not present

## 2021-10-06 DIAGNOSIS — S83231D Complex tear of medial meniscus, current injury, right knee, subsequent encounter: Secondary | ICD-10-CM | POA: Diagnosis not present

## 2021-10-06 DIAGNOSIS — M25661 Stiffness of right knee, not elsewhere classified: Secondary | ICD-10-CM | POA: Diagnosis not present

## 2021-10-06 DIAGNOSIS — R262 Difficulty in walking, not elsewhere classified: Secondary | ICD-10-CM | POA: Diagnosis not present

## 2021-10-06 DIAGNOSIS — S83271D Complex tear of lateral meniscus, current injury, right knee, subsequent encounter: Secondary | ICD-10-CM | POA: Diagnosis not present

## 2021-10-13 ENCOUNTER — Other Ambulatory Visit (HOSPITAL_COMMUNITY): Payer: Self-pay

## 2021-10-27 DIAGNOSIS — M25661 Stiffness of right knee, not elsewhere classified: Secondary | ICD-10-CM | POA: Diagnosis not present

## 2021-10-27 DIAGNOSIS — S83231D Complex tear of medial meniscus, current injury, right knee, subsequent encounter: Secondary | ICD-10-CM | POA: Diagnosis not present

## 2021-10-27 DIAGNOSIS — M6281 Muscle weakness (generalized): Secondary | ICD-10-CM | POA: Diagnosis not present

## 2021-10-27 DIAGNOSIS — S83271D Complex tear of lateral meniscus, current injury, right knee, subsequent encounter: Secondary | ICD-10-CM | POA: Diagnosis not present

## 2021-10-27 DIAGNOSIS — R262 Difficulty in walking, not elsewhere classified: Secondary | ICD-10-CM | POA: Diagnosis not present

## 2021-11-05 DIAGNOSIS — M6281 Muscle weakness (generalized): Secondary | ICD-10-CM | POA: Diagnosis not present

## 2021-11-05 DIAGNOSIS — S83231D Complex tear of medial meniscus, current injury, right knee, subsequent encounter: Secondary | ICD-10-CM | POA: Diagnosis not present

## 2021-11-05 DIAGNOSIS — M25661 Stiffness of right knee, not elsewhere classified: Secondary | ICD-10-CM | POA: Diagnosis not present

## 2021-11-05 DIAGNOSIS — S83271D Complex tear of lateral meniscus, current injury, right knee, subsequent encounter: Secondary | ICD-10-CM | POA: Diagnosis not present

## 2021-11-05 DIAGNOSIS — R262 Difficulty in walking, not elsewhere classified: Secondary | ICD-10-CM | POA: Diagnosis not present

## 2021-11-06 ENCOUNTER — Other Ambulatory Visit: Payer: Self-pay | Admitting: Neurology

## 2021-11-06 ENCOUNTER — Other Ambulatory Visit (HOSPITAL_COMMUNITY): Payer: Self-pay

## 2021-11-06 MED ORDER — ACETAZOLAMIDE ER 500 MG PO CP12
ORAL_CAPSULE | ORAL | 2 refills | Status: DC
  Filled 2021-11-06: qty 60, 30d supply, fill #0
  Filled 2021-12-11: qty 60, 30d supply, fill #1

## 2021-11-07 ENCOUNTER — Other Ambulatory Visit (HOSPITAL_COMMUNITY): Payer: Self-pay

## 2021-11-07 DIAGNOSIS — G4709 Other insomnia: Secondary | ICD-10-CM | POA: Diagnosis not present

## 2021-11-07 DIAGNOSIS — G932 Benign intracranial hypertension: Secondary | ICD-10-CM | POA: Diagnosis not present

## 2021-11-07 DIAGNOSIS — M170 Bilateral primary osteoarthritis of knee: Secondary | ICD-10-CM | POA: Diagnosis not present

## 2021-11-07 DIAGNOSIS — I10 Essential (primary) hypertension: Secondary | ICD-10-CM | POA: Diagnosis not present

## 2021-11-07 DIAGNOSIS — R2241 Localized swelling, mass and lump, right lower limb: Secondary | ICD-10-CM | POA: Diagnosis not present

## 2021-11-07 DIAGNOSIS — Z6841 Body Mass Index (BMI) 40.0 and over, adult: Secondary | ICD-10-CM | POA: Diagnosis not present

## 2021-11-07 DIAGNOSIS — F0630 Mood disorder due to known physiological condition, unspecified: Secondary | ICD-10-CM | POA: Diagnosis not present

## 2021-11-07 DIAGNOSIS — R7303 Prediabetes: Secondary | ICD-10-CM | POA: Diagnosis not present

## 2021-11-07 DIAGNOSIS — G4733 Obstructive sleep apnea (adult) (pediatric): Secondary | ICD-10-CM | POA: Diagnosis not present

## 2021-11-07 MED ORDER — VENLAFAXINE HCL ER 75 MG PO CP24
75.0000 mg | ORAL_CAPSULE | Freq: Every day | ORAL | 2 refills | Status: DC
  Filled 2021-11-07 – 2021-12-29 (×2): qty 90, 90d supply, fill #0

## 2021-11-14 DIAGNOSIS — R609 Edema, unspecified: Secondary | ICD-10-CM | POA: Diagnosis not present

## 2021-11-14 DIAGNOSIS — M79604 Pain in right leg: Secondary | ICD-10-CM | POA: Diagnosis not present

## 2021-11-14 DIAGNOSIS — G4489 Other headache syndrome: Secondary | ICD-10-CM | POA: Diagnosis not present

## 2021-11-27 ENCOUNTER — Other Ambulatory Visit (HOSPITAL_COMMUNITY): Payer: Self-pay

## 2021-11-27 MED ORDER — SAXENDA 18 MG/3ML ~~LOC~~ SOPN
PEN_INJECTOR | SUBCUTANEOUS | 3 refills | Status: DC
  Filled 2021-11-27: qty 15, 30d supply, fill #0
  Filled 2021-12-29: qty 15, 30d supply, fill #1

## 2021-11-27 MED ORDER — MONTELUKAST SODIUM 10 MG PO TABS
ORAL_TABLET | Freq: Every day | ORAL | 2 refills | Status: DC
  Filled 2021-11-27: qty 90, 90d supply, fill #0
  Filled 2022-03-20: qty 90, 90d supply, fill #1
  Filled 2022-08-06: qty 90, 90d supply, fill #2

## 2021-11-28 DIAGNOSIS — G4733 Obstructive sleep apnea (adult) (pediatric): Secondary | ICD-10-CM | POA: Diagnosis not present

## 2021-12-06 ENCOUNTER — Other Ambulatory Visit (HOSPITAL_COMMUNITY): Payer: Self-pay

## 2021-12-11 ENCOUNTER — Other Ambulatory Visit (HOSPITAL_COMMUNITY): Payer: Self-pay

## 2021-12-11 MED ORDER — VENLAFAXINE HCL ER 75 MG PO CP24
ORAL_CAPSULE | ORAL | 2 refills | Status: DC
  Filled 2021-12-11: qty 30, 30d supply, fill #0

## 2021-12-12 ENCOUNTER — Other Ambulatory Visit (HOSPITAL_COMMUNITY): Payer: Self-pay

## 2021-12-17 DIAGNOSIS — M25561 Pain in right knee: Secondary | ICD-10-CM | POA: Diagnosis not present

## 2021-12-25 ENCOUNTER — Other Ambulatory Visit: Payer: Self-pay | Admitting: Gastroenterology

## 2021-12-25 ENCOUNTER — Other Ambulatory Visit (HOSPITAL_COMMUNITY): Payer: Self-pay | Admitting: Gastroenterology

## 2021-12-25 DIAGNOSIS — K219 Gastro-esophageal reflux disease without esophagitis: Secondary | ICD-10-CM | POA: Diagnosis not present

## 2021-12-25 DIAGNOSIS — R1033 Periumbilical pain: Secondary | ICD-10-CM

## 2021-12-25 DIAGNOSIS — K5900 Constipation, unspecified: Secondary | ICD-10-CM | POA: Diagnosis not present

## 2021-12-29 ENCOUNTER — Other Ambulatory Visit: Payer: Self-pay | Admitting: Neurology

## 2021-12-29 ENCOUNTER — Other Ambulatory Visit (HOSPITAL_COMMUNITY): Payer: Self-pay

## 2021-12-30 ENCOUNTER — Ambulatory Visit: Payer: 59 | Admitting: Neurology

## 2021-12-30 ENCOUNTER — Encounter: Payer: Self-pay | Admitting: Neurology

## 2021-12-30 ENCOUNTER — Other Ambulatory Visit (HOSPITAL_COMMUNITY): Payer: Self-pay

## 2021-12-30 VITALS — BP 109/76 | HR 73 | Ht 65.0 in | Wt 245.0 lb

## 2021-12-30 DIAGNOSIS — E559 Vitamin D deficiency, unspecified: Secondary | ICD-10-CM | POA: Diagnosis not present

## 2021-12-30 DIAGNOSIS — E785 Hyperlipidemia, unspecified: Secondary | ICD-10-CM | POA: Diagnosis not present

## 2021-12-30 DIAGNOSIS — I10 Essential (primary) hypertension: Secondary | ICD-10-CM | POA: Diagnosis not present

## 2021-12-30 DIAGNOSIS — G43019 Migraine without aura, intractable, without status migrainosus: Secondary | ICD-10-CM

## 2021-12-30 DIAGNOSIS — I208 Other forms of angina pectoris: Secondary | ICD-10-CM | POA: Diagnosis not present

## 2021-12-30 DIAGNOSIS — G932 Benign intracranial hypertension: Secondary | ICD-10-CM | POA: Diagnosis not present

## 2021-12-30 MED ORDER — ACETAZOLAMIDE 250 MG PO TABS
250.0000 mg | ORAL_TABLET | Freq: Two times a day (BID) | ORAL | 3 refills | Status: DC
  Filled 2021-12-30: qty 180, 90d supply, fill #0

## 2021-12-30 MED ORDER — VENLAFAXINE HCL ER 75 MG PO CP24
ORAL_CAPSULE | ORAL | 2 refills | Status: DC
Start: 2021-12-30 — End: 2022-04-17
  Filled 2021-12-30: qty 30, fill #0
  Filled 2022-01-21: qty 30, 30d supply, fill #0
  Filled 2022-02-24: qty 30, 30d supply, fill #1
  Filled 2022-03-20: qty 30, 30d supply, fill #2

## 2021-12-30 MED ORDER — POTASSIUM CHLORIDE ER 20 MEQ PO TBCR
EXTENDED_RELEASE_TABLET | ORAL | 3 refills | Status: DC
  Filled 2021-12-30: qty 180, 90d supply, fill #0
  Filled 2021-12-31: qty 152, 76d supply, fill #0
  Filled 2021-12-31: qty 28, 14d supply, fill #0
  Filled 2022-04-17: qty 180, 90d supply, fill #1

## 2021-12-30 MED ORDER — RIZATRIPTAN BENZOATE 10 MG PO TABS
ORAL_TABLET | ORAL | 5 refills | Status: DC
  Filled 2021-12-30: qty 10, 30d supply, fill #0

## 2021-12-30 MED ORDER — VITAMIN D3 1.25 MG (50000 UT) PO CAPS
1.0000 | ORAL_CAPSULE | ORAL | 3 refills | Status: DC
Start: 2021-12-30 — End: 2022-07-15
  Filled 2021-12-30: qty 12, 84d supply, fill #0

## 2021-12-30 MED ORDER — MAGNESIUM OXIDE -MG SUPPLEMENT 400 (240 MG) MG PO TABS
1.0000 | ORAL_TABLET | Freq: Two times a day (BID) | ORAL | 3 refills | Status: DC
  Filled 2021-12-30: qty 180, 90d supply, fill #0
  Filled 2022-04-30: qty 180, 90d supply, fill #1

## 2021-12-31 ENCOUNTER — Other Ambulatory Visit (HOSPITAL_COMMUNITY): Payer: Self-pay

## 2022-01-01 DIAGNOSIS — G4733 Obstructive sleep apnea (adult) (pediatric): Secondary | ICD-10-CM | POA: Diagnosis not present

## 2022-01-01 DIAGNOSIS — G43009 Migraine without aura, not intractable, without status migrainosus: Secondary | ICD-10-CM | POA: Diagnosis not present

## 2022-01-01 DIAGNOSIS — F0630 Mood disorder due to known physiological condition, unspecified: Secondary | ICD-10-CM | POA: Diagnosis not present

## 2022-01-01 DIAGNOSIS — M170 Bilateral primary osteoarthritis of knee: Secondary | ICD-10-CM | POA: Diagnosis not present

## 2022-01-01 DIAGNOSIS — G932 Benign intracranial hypertension: Secondary | ICD-10-CM | POA: Diagnosis not present

## 2022-01-01 DIAGNOSIS — E782 Mixed hyperlipidemia: Secondary | ICD-10-CM | POA: Diagnosis not present

## 2022-01-01 DIAGNOSIS — Z6841 Body Mass Index (BMI) 40.0 and over, adult: Secondary | ICD-10-CM | POA: Diagnosis not present

## 2022-01-01 DIAGNOSIS — I10 Essential (primary) hypertension: Secondary | ICD-10-CM | POA: Diagnosis not present

## 2022-01-01 DIAGNOSIS — G4709 Other insomnia: Secondary | ICD-10-CM | POA: Diagnosis not present

## 2022-01-01 DIAGNOSIS — R7303 Prediabetes: Secondary | ICD-10-CM | POA: Diagnosis not present

## 2022-01-09 ENCOUNTER — Encounter (HOSPITAL_COMMUNITY): Payer: Self-pay

## 2022-01-09 ENCOUNTER — Ambulatory Visit (HOSPITAL_COMMUNITY): Payer: 59

## 2022-01-10 ENCOUNTER — Other Ambulatory Visit (HOSPITAL_COMMUNITY): Payer: Self-pay

## 2022-01-21 ENCOUNTER — Other Ambulatory Visit (HOSPITAL_COMMUNITY): Payer: Self-pay

## 2022-01-26 DIAGNOSIS — G4733 Obstructive sleep apnea (adult) (pediatric): Secondary | ICD-10-CM | POA: Diagnosis not present

## 2022-01-26 DIAGNOSIS — F0630 Mood disorder due to known physiological condition, unspecified: Secondary | ICD-10-CM | POA: Diagnosis not present

## 2022-01-26 DIAGNOSIS — R7303 Prediabetes: Secondary | ICD-10-CM | POA: Diagnosis not present

## 2022-01-26 DIAGNOSIS — I10 Essential (primary) hypertension: Secondary | ICD-10-CM | POA: Diagnosis not present

## 2022-01-26 DIAGNOSIS — Z6841 Body Mass Index (BMI) 40.0 and over, adult: Secondary | ICD-10-CM | POA: Diagnosis not present

## 2022-01-26 DIAGNOSIS — M170 Bilateral primary osteoarthritis of knee: Secondary | ICD-10-CM | POA: Diagnosis not present

## 2022-01-26 DIAGNOSIS — G4709 Other insomnia: Secondary | ICD-10-CM | POA: Diagnosis not present

## 2022-01-26 DIAGNOSIS — G43009 Migraine without aura, not intractable, without status migrainosus: Secondary | ICD-10-CM | POA: Diagnosis not present

## 2022-01-26 DIAGNOSIS — G932 Benign intracranial hypertension: Secondary | ICD-10-CM | POA: Diagnosis not present

## 2022-01-28 ENCOUNTER — Ambulatory Visit: Payer: 59 | Admitting: Neurology

## 2022-02-05 ENCOUNTER — Ambulatory Visit (HOSPITAL_COMMUNITY)
Admission: RE | Admit: 2022-02-05 | Discharge: 2022-02-05 | Disposition: A | Discharge status: Home / Self Care | Payer: 59 | Source: Ambulatory Visit | Attending: Gastroenterology | Admitting: Gastroenterology

## 2022-02-05 ENCOUNTER — Other Ambulatory Visit (HOSPITAL_COMMUNITY): Payer: Self-pay

## 2022-02-05 DIAGNOSIS — R109 Unspecified abdominal pain: Secondary | ICD-10-CM | POA: Diagnosis not present

## 2022-02-05 DIAGNOSIS — R1033 Periumbilical pain: Secondary | ICD-10-CM | POA: Diagnosis not present

## 2022-02-05 MED ORDER — IOHEXOL 300 MG/ML  SOLN
100.0000 mL | Freq: Once | INTRAMUSCULAR | Status: AC | PRN
  Administered 2022-02-05: 100 mL via INTRAVENOUS

## 2022-02-05 MED ORDER — IOHEXOL 9 MG/ML PO SOLN
ORAL | Status: AC
  Filled 2022-02-05: qty 1000

## 2022-02-05 MED ORDER — SODIUM CHLORIDE (PF) 0.9 % IJ SOLN
INTRAMUSCULAR | Status: AC
  Filled 2022-02-05: qty 50

## 2022-02-05 MED ORDER — WEGOVY 0.25 MG/0.5ML ~~LOC~~ SOAJ
SUBCUTANEOUS | 1 refills | Status: DC
  Filled 2022-02-05 – 2022-03-19 (×2): qty 2, 28d supply, fill #0

## 2022-02-20 DIAGNOSIS — G4733 Obstructive sleep apnea (adult) (pediatric): Secondary | ICD-10-CM | POA: Diagnosis not present

## 2022-02-24 ENCOUNTER — Other Ambulatory Visit (HOSPITAL_COMMUNITY): Payer: Self-pay

## 2022-03-10 ENCOUNTER — Telehealth: Payer: Self-pay | Admitting: Neurology

## 2022-03-10 NOTE — Telephone Encounter (Signed)
Would you like to increase this med dosage going forward.

## 2022-03-10 NOTE — Telephone Encounter (Signed)
Pt said, need new prescription for acetaZOLAMIDE (DIAMOX) 250 MG tablet increased to 500 mg. Pt increased dosage due to having symptoms returned dizziness, turbulent in ear. Would like a call from the nurse.

## 2022-03-11 ENCOUNTER — Other Ambulatory Visit (HOSPITAL_COMMUNITY): Payer: Self-pay

## 2022-03-11 MED ORDER — ACETAZOLAMIDE ER 500 MG PO CP12
500.0000 mg | ORAL_CAPSULE | Freq: Two times a day (BID) | ORAL | 4 refills | Status: DC
  Filled 2022-03-11: qty 60, 30d supply, fill #0
  Filled 2022-04-17: qty 60, 30d supply, fill #1
  Filled 2022-04-30 – 2022-05-25 (×2): qty 60, 30d supply, fill #2
  Filled 2022-06-25: qty 60, 30d supply, fill #3

## 2022-03-11 NOTE — Telephone Encounter (Signed)
I called the pt and relayed information from NP. Pt agreeable to bring CPAP by for DL (will plan for tomorrow) and making f/u with eye doctor (estimates last visit was March) .  Rx for Diamox 500 mg bid sent to [facility].

## 2022-03-11 NOTE — Addendum Note (Signed)
Addended by: Verlin Grills on: 03/11/2022 09:11 AM   Modules accepted: Orders

## 2022-03-11 NOTE — Telephone Encounter (Signed)
Chart reviewed, seen 12/30/21, Diamox reduced from 500 mg BID to 250 mg BID. It is ok to go back to higher dose if it was more helpful for headaches. I would also see her eye doctor to ensure no papilledema is present. I was not able to pull a CPAP download, if she wants to bring her machine by, we can check to make sure no issues causing headaches from CPAP.

## 2022-03-16 ENCOUNTER — Other Ambulatory Visit (HOSPITAL_COMMUNITY): Payer: Self-pay

## 2022-03-16 DIAGNOSIS — G4709 Other insomnia: Secondary | ICD-10-CM | POA: Diagnosis not present

## 2022-03-16 DIAGNOSIS — Z6841 Body Mass Index (BMI) 40.0 and over, adult: Secondary | ICD-10-CM | POA: Diagnosis not present

## 2022-03-16 DIAGNOSIS — G43009 Migraine without aura, not intractable, without status migrainosus: Secondary | ICD-10-CM | POA: Diagnosis not present

## 2022-03-16 DIAGNOSIS — R112 Nausea with vomiting, unspecified: Secondary | ICD-10-CM | POA: Diagnosis not present

## 2022-03-16 DIAGNOSIS — R7303 Prediabetes: Secondary | ICD-10-CM | POA: Diagnosis not present

## 2022-03-16 DIAGNOSIS — G932 Benign intracranial hypertension: Secondary | ICD-10-CM | POA: Diagnosis not present

## 2022-03-16 DIAGNOSIS — I10 Essential (primary) hypertension: Secondary | ICD-10-CM | POA: Diagnosis not present

## 2022-03-16 DIAGNOSIS — G4733 Obstructive sleep apnea (adult) (pediatric): Secondary | ICD-10-CM | POA: Diagnosis not present

## 2022-03-16 DIAGNOSIS — M170 Bilateral primary osteoarthritis of knee: Secondary | ICD-10-CM | POA: Diagnosis not present

## 2022-03-16 MED ORDER — WEGOVY 0.25 MG/0.5ML ~~LOC~~ SOAJ
0.2500 mg | SUBCUTANEOUS | 1 refills | Status: DC
  Filled 2022-03-16 – 2022-07-20 (×4): qty 2, 28d supply, fill #0

## 2022-03-16 NOTE — Telephone Encounter (Signed)
Pt came by the office today and I was able to DL CPAP. Placed on NP's desk for review.

## 2022-03-17 NOTE — Telephone Encounter (Signed)
I was able to review CPAP download from 02/14/2022-03/15/2022, 27/30 days at 90%, greater than 4 hours 8 days at 27%.  Average usage days used 3 hours and 15 minutes.  Set pressure 7 cm water.  Leak in the 95th percentile 20.8, AHI 0.1.  I would encourage her to increase nightly use greater than 4 hours, she is currently very suboptimal in this area.  Otherwise apnea is well treated. Any reason for this? I likely need to send an order for continued supplies. If she notices leaking mask we can order refit or she just may need new mask. Next appointment is in Jan, make sure she bring her machine then. Thanks

## 2022-03-17 NOTE — Telephone Encounter (Signed)
FYI  Contacted pt back, went over [employer]. She stated she sometimes fail to put CPAP on prior to going to sleep. Also that she naps on the couch VS being in bed with her machine, did reiterate to try to get at least 4 hrs of usage on machine and we will see her at FU in Jan

## 2022-03-19 ENCOUNTER — Other Ambulatory Visit (HOSPITAL_COMMUNITY): Payer: Self-pay

## 2022-03-20 ENCOUNTER — Other Ambulatory Visit (HOSPITAL_COMMUNITY): Payer: Self-pay

## 2022-03-21 ENCOUNTER — Other Ambulatory Visit (HOSPITAL_COMMUNITY): Payer: Self-pay

## 2022-03-23 ENCOUNTER — Other Ambulatory Visit (HOSPITAL_COMMUNITY): Payer: Self-pay

## 2022-03-30 ENCOUNTER — Other Ambulatory Visit (HOSPITAL_COMMUNITY): Payer: Self-pay

## 2022-04-09 ENCOUNTER — Other Ambulatory Visit (HOSPITAL_COMMUNITY): Payer: Self-pay

## 2022-04-15 ENCOUNTER — Other Ambulatory Visit (HOSPITAL_COMMUNITY): Payer: Self-pay

## 2022-04-17 ENCOUNTER — Other Ambulatory Visit (HOSPITAL_COMMUNITY): Payer: Self-pay

## 2022-04-17 ENCOUNTER — Other Ambulatory Visit: Payer: Self-pay | Admitting: Neurology

## 2022-04-17 MED ORDER — TRIAMTERENE-HCTZ 75-50 MG PO TABS
1.0000 | ORAL_TABLET | Freq: Every day | ORAL | 3 refills | Status: DC
  Filled 2022-04-17: qty 90, 90d supply, fill #0
  Filled 2022-08-06: qty 90, 90d supply, fill #1
  Filled 2022-11-23: qty 90, 90d supply, fill #2
  Filled 2023-03-23: qty 90, 90d supply, fill #3

## 2022-04-17 MED ORDER — VENLAFAXINE HCL ER 75 MG PO CP24
75.0000 mg | ORAL_CAPSULE | Freq: Every day | ORAL | 2 refills | Status: DC
  Filled 2022-04-17: qty 30, 30d supply, fill #0
  Filled 2022-05-25: qty 30, 30d supply, fill #1
  Filled 2022-06-25: qty 30, 30d supply, fill #2

## 2022-04-20 ENCOUNTER — Other Ambulatory Visit (HOSPITAL_COMMUNITY): Payer: Self-pay

## 2022-04-30 ENCOUNTER — Other Ambulatory Visit (HOSPITAL_COMMUNITY): Payer: Self-pay

## 2022-04-30 ENCOUNTER — Other Ambulatory Visit: Payer: Self-pay | Admitting: Internal Medicine

## 2022-04-30 DIAGNOSIS — Z1231 Encounter for screening mammogram for malignant neoplasm of breast: Secondary | ICD-10-CM

## 2022-04-30 MED ORDER — HYDROXYZINE HCL 25 MG PO TABS
25.0000 mg | ORAL_TABLET | Freq: Every evening | ORAL | 2 refills | Status: DC | PRN
  Filled 2022-04-30: qty 90, 90d supply, fill #0
  Filled 2022-08-06: qty 90, 90d supply, fill #1
  Filled 2022-09-12: qty 90, 90d supply, fill #2

## 2022-04-30 MED ORDER — NYSTATIN-TRIAMCINOLONE 100000-0.1 UNIT/GM-% EX CREA
TOPICAL_CREAM | CUTANEOUS | 1 refills | Status: DC
  Filled 2022-04-30: qty 30, 7d supply, fill #0
  Filled 2023-04-14: qty 30, 7d supply, fill #1

## 2022-04-30 MED ORDER — FLUOCINONIDE 0.05 % EX CREA
1.0000 | TOPICAL_CREAM | Freq: Two times a day (BID) | CUTANEOUS | 2 refills | Status: DC
  Filled 2022-04-30: qty 30, 15d supply, fill #0
  Filled 2022-05-25: qty 30, 15d supply, fill #1
  Filled 2022-08-14: qty 30, 15d supply, fill #2

## 2022-04-30 MED ORDER — MOMETASONE FUROATE 0.1 % EX CREA
1.0000 | TOPICAL_CREAM | Freq: Two times a day (BID) | CUTANEOUS | 0 refills | Status: DC | PRN
  Filled 2022-04-30: qty 45, 30d supply, fill #0

## 2022-05-01 ENCOUNTER — Other Ambulatory Visit (HOSPITAL_COMMUNITY): Payer: Self-pay

## 2022-05-04 ENCOUNTER — Other Ambulatory Visit (HOSPITAL_COMMUNITY): Payer: Self-pay

## 2022-05-05 ENCOUNTER — Other Ambulatory Visit (HOSPITAL_COMMUNITY): Payer: Self-pay

## 2022-05-05 DIAGNOSIS — G932 Benign intracranial hypertension: Secondary | ICD-10-CM | POA: Diagnosis not present

## 2022-05-05 DIAGNOSIS — G43009 Migraine without aura, not intractable, without status migrainosus: Secondary | ICD-10-CM | POA: Diagnosis not present

## 2022-05-05 DIAGNOSIS — G4733 Obstructive sleep apnea (adult) (pediatric): Secondary | ICD-10-CM | POA: Diagnosis not present

## 2022-05-05 DIAGNOSIS — R112 Nausea with vomiting, unspecified: Secondary | ICD-10-CM | POA: Diagnosis not present

## 2022-05-05 DIAGNOSIS — R7303 Prediabetes: Secondary | ICD-10-CM | POA: Diagnosis not present

## 2022-05-05 DIAGNOSIS — M170 Bilateral primary osteoarthritis of knee: Secondary | ICD-10-CM | POA: Diagnosis not present

## 2022-05-05 DIAGNOSIS — Z6841 Body Mass Index (BMI) 40.0 and over, adult: Secondary | ICD-10-CM | POA: Diagnosis not present

## 2022-05-05 DIAGNOSIS — E782 Mixed hyperlipidemia: Secondary | ICD-10-CM | POA: Diagnosis not present

## 2022-05-05 DIAGNOSIS — I10 Essential (primary) hypertension: Secondary | ICD-10-CM | POA: Diagnosis not present

## 2022-05-05 DIAGNOSIS — G4709 Other insomnia: Secondary | ICD-10-CM | POA: Diagnosis not present

## 2022-05-05 MED ORDER — VITAMIN D3 1.25 MG (50000 UT) PO CAPS
1.0000 | ORAL_CAPSULE | ORAL | 3 refills | Status: DC
  Filled 2022-05-05: qty 12, 84d supply, fill #0
  Filled 2022-09-12: qty 12, 84d supply, fill #1
  Filled 2023-01-06: qty 12, 84d supply, fill #2
  Filled 2023-04-14: qty 12, 84d supply, fill #3

## 2022-05-05 MED ORDER — MAGNESIUM OXIDE -MG SUPPLEMENT 400 (240 MG) MG PO TABS
1.0000 | ORAL_TABLET | Freq: Two times a day (BID) | ORAL | 3 refills | Status: DC
  Filled 2022-05-05 – 2022-08-06 (×2): qty 180, 90d supply, fill #0
  Filled 2023-01-06: qty 180, 90d supply, fill #1
  Filled 2023-04-14: qty 180, 90d supply, fill #2

## 2022-05-05 MED ORDER — POTASSIUM CHLORIDE ER 20 MEQ PO TBCR
20.0000 meq | EXTENDED_RELEASE_TABLET | Freq: Two times a day (BID) | ORAL | 3 refills | Status: DC
  Filled 2022-05-05: qty 180, 90d supply, fill #0

## 2022-05-06 ENCOUNTER — Other Ambulatory Visit (HOSPITAL_COMMUNITY): Payer: Self-pay

## 2022-05-11 ENCOUNTER — Other Ambulatory Visit (HOSPITAL_COMMUNITY): Payer: Self-pay

## 2022-05-11 MED ORDER — AZITHROMYCIN 250 MG PO TABS
ORAL_TABLET | ORAL | 0 refills | Status: DC
  Filled 2022-05-11: qty 6, 5d supply, fill #0

## 2022-05-11 MED ORDER — IPRATROPIUM BROMIDE 0.06 % NA SOLN
2.0000 | Freq: Three times a day (TID) | NASAL | 2 refills | Status: AC
  Filled 2022-05-11: qty 15, 13d supply, fill #0
  Filled 2022-08-06: qty 15, 13d supply, fill #1

## 2022-05-14 ENCOUNTER — Ambulatory Visit: Payer: 59

## 2022-05-21 DIAGNOSIS — G4733 Obstructive sleep apnea (adult) (pediatric): Secondary | ICD-10-CM | POA: Diagnosis not present

## 2022-05-25 ENCOUNTER — Other Ambulatory Visit (HOSPITAL_COMMUNITY): Payer: Self-pay

## 2022-05-26 ENCOUNTER — Other Ambulatory Visit (HOSPITAL_COMMUNITY): Payer: Self-pay

## 2022-06-01 ENCOUNTER — Other Ambulatory Visit (HOSPITAL_COMMUNITY): Payer: Self-pay

## 2022-06-02 ENCOUNTER — Other Ambulatory Visit (HOSPITAL_COMMUNITY): Payer: Self-pay

## 2022-06-02 DIAGNOSIS — G932 Benign intracranial hypertension: Secondary | ICD-10-CM | POA: Diagnosis not present

## 2022-06-02 DIAGNOSIS — F0630 Mood disorder due to known physiological condition, unspecified: Secondary | ICD-10-CM | POA: Diagnosis not present

## 2022-06-02 DIAGNOSIS — I10 Essential (primary) hypertension: Secondary | ICD-10-CM | POA: Diagnosis not present

## 2022-06-02 DIAGNOSIS — J0101 Acute recurrent maxillary sinusitis: Secondary | ICD-10-CM | POA: Diagnosis not present

## 2022-06-02 DIAGNOSIS — M170 Bilateral primary osteoarthritis of knee: Secondary | ICD-10-CM | POA: Diagnosis not present

## 2022-06-02 DIAGNOSIS — E782 Mixed hyperlipidemia: Secondary | ICD-10-CM | POA: Diagnosis not present

## 2022-06-02 DIAGNOSIS — G4709 Other insomnia: Secondary | ICD-10-CM | POA: Diagnosis not present

## 2022-06-02 DIAGNOSIS — R7303 Prediabetes: Secondary | ICD-10-CM | POA: Diagnosis not present

## 2022-06-02 DIAGNOSIS — G4733 Obstructive sleep apnea (adult) (pediatric): Secondary | ICD-10-CM | POA: Diagnosis not present

## 2022-06-02 DIAGNOSIS — Z6841 Body Mass Index (BMI) 40.0 and over, adult: Secondary | ICD-10-CM | POA: Diagnosis not present

## 2022-06-02 MED ORDER — MOUNJARO 2.5 MG/0.5ML ~~LOC~~ SOAJ
2.5000 mg | SUBCUTANEOUS | 1 refills | Status: DC
  Filled 2022-06-02 – 2022-06-03 (×3): qty 2, 28d supply, fill #0
  Filled 2022-06-18 – 2022-06-27 (×2): qty 2, 28d supply, fill #1

## 2022-06-02 MED ORDER — AZITHROMYCIN 250 MG PO TABS
ORAL_TABLET | ORAL | 0 refills | Status: AC
  Filled 2022-06-02: qty 6, 5d supply, fill #0

## 2022-06-03 ENCOUNTER — Other Ambulatory Visit (HOSPITAL_COMMUNITY): Payer: Self-pay

## 2022-06-04 ENCOUNTER — Other Ambulatory Visit (HOSPITAL_COMMUNITY): Payer: Self-pay

## 2022-06-05 ENCOUNTER — Ambulatory Visit: Payer: 59

## 2022-06-05 ENCOUNTER — Ambulatory Visit
Admission: RE | Admit: 2022-06-05 | Discharge: 2022-06-05 | Disposition: A | Discharge status: Home / Self Care | Payer: 59 | Source: Ambulatory Visit | Attending: Internal Medicine | Admitting: Internal Medicine

## 2022-06-05 DIAGNOSIS — Z1231 Encounter for screening mammogram for malignant neoplasm of breast: Secondary | ICD-10-CM | POA: Diagnosis not present

## 2022-06-18 ENCOUNTER — Other Ambulatory Visit (HOSPITAL_COMMUNITY): Payer: Self-pay

## 2022-06-25 ENCOUNTER — Other Ambulatory Visit (HOSPITAL_COMMUNITY): Payer: Self-pay

## 2022-06-25 MED ORDER — NEBIVOLOL HCL 5 MG PO TABS
5.0000 mg | ORAL_TABLET | Freq: Every day | ORAL | 3 refills | Status: DC
  Filled 2022-06-25: qty 90, 90d supply, fill #0
  Filled 2022-08-06 – 2022-10-27 (×2): qty 90, 90d supply, fill #1
  Filled 2022-11-23 – 2023-02-03 (×2): qty 90, 90d supply, fill #2
  Filled 2023-04-29: qty 90, 90d supply, fill #3

## 2022-06-26 ENCOUNTER — Other Ambulatory Visit (HOSPITAL_COMMUNITY): Payer: Self-pay

## 2022-06-26 ENCOUNTER — Other Ambulatory Visit: Payer: Self-pay

## 2022-06-30 ENCOUNTER — Other Ambulatory Visit (HOSPITAL_COMMUNITY): Payer: Self-pay

## 2022-07-14 ENCOUNTER — Other Ambulatory Visit (HOSPITAL_COMMUNITY): Payer: Self-pay

## 2022-07-14 DIAGNOSIS — N952 Postmenopausal atrophic vaginitis: Secondary | ICD-10-CM | POA: Diagnosis not present

## 2022-07-14 DIAGNOSIS — N8111 Cystocele, midline: Secondary | ICD-10-CM | POA: Diagnosis not present

## 2022-07-14 DIAGNOSIS — Z01419 Encounter for gynecological examination (general) (routine) without abnormal findings: Secondary | ICD-10-CM | POA: Diagnosis not present

## 2022-07-14 DIAGNOSIS — B3789 Other sites of candidiasis: Secondary | ICD-10-CM | POA: Diagnosis not present

## 2022-07-14 MED ORDER — ESTRADIOL 0.1 MG/GM VA CREA
TOPICAL_CREAM | VAGINAL | 11 refills | Status: DC
  Filled 2022-07-14: qty 42.5, 90d supply, fill #0

## 2022-07-14 MED ORDER — NYSTATIN-TRIAMCINOLONE 100000-0.1 UNIT/GM-% EX OINT
TOPICAL_OINTMENT | CUTANEOUS | 3 refills | Status: DC
  Filled 2022-07-14: qty 30, 30d supply, fill #0
  Filled 2023-04-14: qty 30, 30d supply, fill #1

## 2022-07-14 MED ORDER — FLUCONAZOLE 150 MG PO TABS
150.0000 mg | ORAL_TABLET | ORAL | 0 refills | Status: DC
  Filled 2022-07-14: qty 2, 3d supply, fill #0

## 2022-07-15 ENCOUNTER — Other Ambulatory Visit: Payer: Self-pay

## 2022-07-15 ENCOUNTER — Ambulatory Visit: Payer: Commercial Managed Care - PPO | Admitting: Neurology

## 2022-07-15 ENCOUNTER — Encounter: Payer: Self-pay | Admitting: Neurology

## 2022-07-15 ENCOUNTER — Other Ambulatory Visit (HOSPITAL_COMMUNITY): Payer: Self-pay

## 2022-07-15 VITALS — BP 122/70 | HR 80 | Ht 64.0 in | Wt 244.0 lb

## 2022-07-15 DIAGNOSIS — G43019 Migraine without aura, intractable, without status migrainosus: Secondary | ICD-10-CM

## 2022-07-15 DIAGNOSIS — G932 Benign intracranial hypertension: Secondary | ICD-10-CM | POA: Diagnosis not present

## 2022-07-15 MED ORDER — ACETAZOLAMIDE 250 MG PO TABS
ORAL_TABLET | ORAL | 3 refills | Status: DC
  Filled 2022-07-15 – 2022-07-16 (×2): qty 270, 90d supply, fill #0
  Filled 2022-11-23: qty 270, 90d supply, fill #1

## 2022-07-15 MED ORDER — VENLAFAXINE HCL ER 75 MG PO CP24
75.0000 mg | ORAL_CAPSULE | Freq: Every day | ORAL | 3 refills | Status: DC
  Filled 2022-07-15 – 2022-07-20 (×2): qty 90, 90d supply, fill #0
  Filled 2022-11-23: qty 90, 90d supply, fill #1

## 2022-07-15 NOTE — Progress Notes (Signed)
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 Tried to lower Diamox from 500 mg BID, to 250 mg BID but felt off balance and fluid retention. Back on Diamox 500 mg BID. If she stands too quickly, can feel dizzy. Takes Effexor XR 75 mg daily. About 2 migraines a month. Often stress induced. Has Maxalt if needed, rarely takes. Is on Mounjaro for weight loss, 10 lbs weight loss last 3 months. Is due for eye exam. Overall doing well. Didn't bring CPAP, we couldn't pull a download.  Update 12/30/21 SS: Zadie Rhine is here today for follow-up.  Has not been seen since December 2021. Using CPAP, can't pull download, no data from April 2022, maybe not transmitting? Is RN, Working night shift again, had few more headaches then. Has lost 40 lbs over the last year. Feels migraines are better. On Saxenda. No more than 2 migraines a month. Maxalt works well, rarely takes. On Effexor XR 75 mg daily, Diamox 500 mg twice daily. Reports saw eye doctor recently, no papilledema. Got new glasses. Dr. Sabra Heck. 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   Update 06/19/2020 SS: Kaylah Chiasson is a 57 year old female with history of obesity, possible pseudotumor cerebri, and migraine headaches.  She is on Diamox and Effexor.  She is on CPAP.  Headaches remain well controlled.  She may have no more than 2 migraines a month, sometimes none.  Usually brought on by stress.  For headache, Maxalt is beneficial, but laying down and sleeping works the best.  She is a Marine scientist, has FMLA, she may miss 1 or 2 days every 3 or so months.  Since 2014, has some occasional dizziness with standing, may feel she leans to the right, her balance is not as good.  Occasionally hears a swishing to her right ear, generally well controlled when her weight  is maintained, she stays hydrated, and takes her medications.  She has yet to see her ophthalmologist.  Previously she has tried to wean off Diamox, and her headaches return.  CPAP is quite beneficial for headaches.  Presents today for evaluation unaccompanied.  HISTORY 07/11/2019 SS: Ms. Deschene is a 57 year old female with history of obesity, possible pseudotumor cerebri.  She remains on Diamox and Effexor.  She reported previously that her eye doctor has found that her papilledema had disappeared.  She reports she is due for a repeat eye visit in the next several months.  She indicates her headaches have been doing very well, this is the best she has felt in a long time.  She reports on average she may have 2 migraines a month.  She says she is not having to take Maxalt as often.  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  She finds Maxalt to be beneficial, along with Zofran, but afterwards she feels drowsy and needs to lie down.  She remains on CPAP.  She presents today for evaluation via virtual visit.   REVIEW OF SYSTEMS: Out of a complete 14 system review of symptoms, the patient complains only of the following symptoms, and all other reviewed systems are negative.  See HPI  ALLERGIES: Allergies  Allergen Reactions   Benazepril-Hydrochlorothiazide Swelling    angioedema   Codeine Nausea And Vomiting  Hydrocodone Itching   HOME MEDICATIONS: Outpatient Medications Prior to Visit  Medication Sig Dispense Refill   albuterol (VENTOLIN HFA) 108 (90 Base) MCG/ACT inhaler Inhale 2 puffs by mouth every 4 hours as needed for wheezing 6.7 g 3   cetirizine (ZYRTEC) 10 MG tablet Take 10 mg by mouth daily.     Cholecalciferol (VITAMIN D3) 1.25 MG (50000 UT) CAPS Take 1 capsule by mouth once a week. 12 capsule 3   estradiol (ESTRACE VAGINAL) 0.1 MG/GM vaginal cream Insert 0.5 grams vaginally twice weekly as directed 60 g 11   famotidine (PEPCID) 20 MG tablet  Take 20 mg by mouth 2 (two) times daily.      fluconazole (DIFLUCAN) 150 MG tablet Take 1 tablet (150 mg total) by mouth as directed. Repeat in 3 days as directed 2 tablet 0   fluocinonide (LIDEX) 0.05 % external solution Apply a small amount to scalp twice daily as directed as needed for flares 60 mL 4   fluocinonide cream (LIDEX) 0.05 % Apply a small amount to the affected area of skin on back and left arm 2 (two) times daily as directed 30 g 2   fluticasone (FLONASE) 50 MCG/ACT nasal spray Place 2 sprays into both nostrils daily. 1 g 0   hydrOXYzine (ATARAX) 25 MG tablet Take 1 tablet (25 mg total) by mouth at bedtime as needed. 90 tablet 2   ipratropium (ATROVENT) 0.06 % nasal spray Place 2 sprays into both nostrils 3 (three) times daily. 15 mL 2   magnesium oxide (MAG-OX) 400 (240 Mg) MG tablet TAKE 1 TABLET BY MOUTH TWICE DAILY 180 tablet 3   magnesium oxide (MAG-OX) 400 (240 Mg) MG tablet Take 1 tablet (400 mg total) by mouth 2 (two) times daily. 180 tablet 3   mometasone (ELOCON) 0.1 % cream Apply a small amount/thin layer to affected area twice a day as needed for flares 45 g 3   mometasone (ELOCON) 0.1 % cream Apply small thin amount topically twice daily as needed for flares 45 g 0   montelukast (SINGULAIR) 10 MG tablet TAKE 1 TABLET BY MOUTH ONCE A DAY 90 tablet 2   nebivolol (BYSTOLIC) 5 MG tablet Take 1 tablet (5 mg total) by mouth daily. 90 tablet 3   nystatin-triamcinolone (MYCOLOG II) cream Apply to affected area topically twice a day as needed for  7 days 30 g 1   nystatin-triamcinolone ointment (MYCOLOG) Apply to the affected area(s) of skin twice a day for 2 weeks then as needed 30 g 3   pantoprazole (PROTONIX) 40 MG tablet Take 1 tablet by mouth 20 minutes before breakfast daily 90 tablet 4   Potassium Chloride ER 20 MEQ TBCR Take 1 tablet by mouth twice daily with food 180 tablet 3   rizatriptan (MAXALT) 10 MG tablet TAKE 1 TABLET BY MOUTH 3 TIMES DAILY AS NEEDED FOR MIGRAINE  AS DIRECTED 10 tablet 5   Semaglutide-Weight Management (WEGOVY) 0.25 MG/0.5ML SOAJ Inject 0.25 mg into the skin once a week. 2 mL 1   sennosides-docusate sodium (SENOKOT-S) 8.6-50 MG tablet Take 2 tablets by mouth as needed.      tirzepatide [facility]) 2.5 MG/0.5ML Pen Inject 2.5 mg into the skin once a week. 2 mL 1   triamterene-hydrochlorothiazide (MAXZIDE) 75-50 MG tablet Take 1 tablet by mouth daily. (stop losartan) 90 tablet 3   Vitamin D, Ergocalciferol, (DRISDOL) 50000 units CAPS capsule Take 50,000 Units by mouth every 7 (seven) days. Every Sunday.  acetaZOLAMIDE ER (DIAMOX) 500 MG capsule Take 1 capsule (500 mg total) by mouth 2 (two) times daily. 60 capsule 4   venlafaxine XR (EFFEXOR-XR) 75 MG 24 hr capsule Take 1 capsule (75 mg total) by mouth daily. 30 capsule 2   Cholecalciferol (VITAMIN D3) 1.25 MG (50000 UT) CAPS TAKE 1 CAPSULE BY MOUTH ONCE WEEKLY 12 capsule 3   Cholecalciferol (VITAMIN D3) 1.25 MG (50000 UT) CAPS TAKE 1 CAPSULE BY MOUTH ONCE WEEKLY 12 capsule 3   Liraglutide -Weight Management (SAXENDA) 18 MG/3ML SOPN Inject 0.6 mg into the skin once daily for 1 week and increase dose as directed. 15 mL 3   magnesium oxide (MAG-OX) 400 (240 Mg) MG tablet TAKE 1 TABLET BY MOUTH TWICE DAILY 180 tablet 3   Potassium Chloride ER 20 MEQ TBCR Take 1 tablet (20 meq) by mouth 2 times per day with food 180 tablet 3   Potassium Chloride ER 20 MEQ TBCR Take 20 mEq (1 tablet) by mouth 2 (two) times daily with food. 180 tablet 3   potassium chloride SA (K-DUR,KLOR-CON) 20 MEQ tablet Take 20 mEq by mouth 2 (two) times daily.     potassium chloride SA (KLOR-CON M) 20 MEQ tablet TAKE 1 TABLET BY MOUTH ONCE DAILY 90 tablet 3   Semaglutide-Weight Management (WEGOVY) 0.25 MG/0.5ML SOAJ Inject 0.25 mg into the skin once a week 2 mL 1   venlafaxine XR (EFFEXOR-XR) 75 MG 24 hr capsule TAKE 1 CAPSULE BY MOUTH EVERY NIGHT AT BEDTIME 90 capsule 3   No facility-administered medications prior to visit.     PAST MEDICAL HISTORY: Past Medical History:  Diagnosis Date   Allergy    takes Zyrtec daily   Arthritis    Back pain    arthritis   Benign intracranial hypertension    Chronic constipation    takes Stool Softener   Dizziness    Eczema    Family history of anesthesia complication    mother got confused some after anesthesia   Gastroesophageal reflux disease    takes Pepcid daily   Headache(784.0)    benign intercranial HTN d/t CSF leak, migraines   History of bronchitis    pt states a very long time ago   History of MRSA infection    > 19yr ago   Hypertension    takes Lotensin daily   Lumbago    Obesity, unspecified    Peripheral edema    Pulsatile tinnitus    Sleep apnea    Unspecified vitamin D deficiency     PAST SURGICAL HISTORY: Past Surgical History:  Procedure Laterality Date   ANTERIOR AND POSTERIOR REPAIR N/A 10/10/2015   Procedure: ANTERIOR (CYSTOCELE) AND POSTERIOR REPAIR (RECTOCELE);  Surgeon: SBjorn Loser MD;  [facility];  Service: Urology;  Laterality: N/A;   CARDIOVASCULAR STRESS TEST     CESAREAN SECTION  [id number]   CHOLECYSTECTOMY     CYSTO N/A 10/10/2015   Procedure: CYSTO;  Surgeon: ScBjorn LoserMD;  [facility];  Service: Urology;  Laterality: N/A;   GALLBLADDER SURGERY  [id number]   SINUS ENDO W/FUSION Bilateral 02/03/2013   Procedure: ENDOSCOPIC SINUS SURGERY WITH FUSION NAVIGATION;  Surgeon: MitRuby ColaD;  [facility];  Laterality: Bilateral;  Repaired CSF Leak   [place]OCARDIOGRAPHY     VAGINAL HYSTERECTOMY N/A 10/10/2015   Procedure: HYSTERECTOMY VAGINAL;  Surgeon: SheServando SalinaD;  [facility];  Service: Gynecology;  Laterality: N/A;   VAGINAL PROLAPSE  REPAIR N/A 10/10/2015   Procedure: VAULT PROLAPSE AND GRAFT;  Surgeon: Bjorn Loser, MD;  Location: [facility];  Service: Urology;  Laterality: N/A;    FAMILY HISTORY: Family History  Problem Relation Age of Onset   Cancer - Prostate  Father    Hypertension Father    Hypertension Mother    Diabetes Mother    Heart attack Neg Hx    Hyperlipidemia Neg Hx    Sudden death Neg Hx     SOCIAL HISTORY: Social History   Socioeconomic History   Marital status: Married    Spouse name: Nicole Kindred   Number of children: 12   Years of education: 12+   Highest education level: Not on file  Occupational History    Employer: [employer]  Tobacco Use   Smoking status: Never   Smokeless tobacco: Never  Substance and Sexual Activity   Alcohol use: No    Alcohol/week: 0.0 standard drinks of alcohol   Drug use: No   Sexual activity: Yes  Other Topics Concern   Not on file  Social History Narrative   Patient lives at home with husband and son. Nicole Kindred)   Patient has 2 children.    Patient is currently working as an Therapist, sports.   Patient has a college education.    Patient is right handed.    Consumes drinks 1 16oz soda 3 times a week, rarely drinks coffee.    Works night shift   Social Determinants of Radio broadcast assistant Strain: Not on file  Food Insecurity: Not on file  Transportation Needs: Not on file  Physical Activity: Not on file  Stress: Not on file  Social Connections: Not on file  Intimate Partner Violence: Not on file    PHYSICAL EXAM  Vitals:   07/15/22 1323  BP: 122/70  Pulse: 80  Weight: 244 lb (110.7 kg)  Height: '5\' 4"'$  (1.626 m)   Body mass index is 41.88 kg/m.  Generalized: Well developed, in no acute distress   Neurological examination  Mentation: Alert oriented to time, place, history taking. Follows all commands speech and language fluent Cranial nerve II-XII: Pupils were equal round reactive to light. Extraocular movements were full, visual field were full on confrontational test. Facial sensation and strength were normal.  Head turning and shoulder shrug  were normal and symmetric. Motor: The motor testing reveals 5 over 5 strength of all 4 extremities. Good symmetric motor tone is noted  throughout.  Sensory: Sensory testing is intact to soft touch on all 4 extremities. No evidence of extinction is noted.  Coordination: Cerebellar testing reveals good finger-nose-finger and heel-to-shin bilaterally.  Gait and station: Gait is normal.   Reflexes: Deep tendon reflexes are symmetric and normal bilaterally.   DIAGNOSTIC DATA (LABS, IMAGING, TESTING) - I reviewed patient records, labs, notes, testing and imaging myself where available.  Lab Results  Component Value Date   WBC 10.4 10/11/2015   HGB 12.3 10/11/2015   HCT 37.8 10/11/2015   MCV 81.8 10/11/2015   PLT 369 10/11/2015      Component Value Date/Time   NA 134 (L) 10/11/2015 0530   NA 140 12/10/2011 0000   K 3.3 (L) 10/11/2015 0530   CL 103 10/11/2015 0530   CO2 23 10/11/2015 0530   GLUCOSE 107 (H) 10/11/2015 0530   BUN 13 10/11/2015 0530   BUN 11 12/10/2011 0000   CREATININE 0.77 10/11/2015 0530   CALCIUM 8.5 (L) 10/11/2015 0530   PROT 6.4 02/05/2013 0502  ALBUMIN 3.1 (L) 02/05/2013 0502   AST 20 02/05/2013 0502   ALT 12 02/05/2013 0502   ALKPHOS 77 02/05/2013 0502   BILITOT 0.4 02/05/2013 0502   GFRNONAA >60 10/11/2015 0530   GFRAA >60 10/11/2015 0530   No results found for: "CHOL", "HDL", "LDLCALC", "LDLDIRECT", "TRIG", "CHOLHDL" No results found for: "HGBA1C" No results found for: "VITAMINB12" No results found for: "TSH"  ASSESSMENT AND PLAN 57 y.o. year old female   1.  Pseudotumor cerebri 2.  Migraine headache -Continues to do well, we will again try to lower her Diamox from 500 mg twice a day to 250 mg AM/500 mg PM -Continue Effexor XR 75 mg daily -Continue Maxalt as needed for acute headache -Monitor for any worsening symptoms, sure to keep annual ophthalmology follow-up -Follow-up 6 months  3. OSA on CPAP -Fortunately unable to pull electronic download to review today, at last review patient's greater than 4-hour use was suboptimal, she will need to bring CPAP by for card  download  Butler Denmark, AGNP-C, DNP 07/15/2022, 1:53 PM [facility] [address] [place], [place] [address] [phone]

## 2022-07-15 NOTE — Patient Instructions (Signed)
Try to reduce Diamox to 250 mg in the AM/500 mg in the evening  Continue the Effexor-XR 75 mg daily Make sure you see your eye doctor for annual visit Please bring your CPAP by so we can pull a download See you back in 6 months

## 2022-07-16 ENCOUNTER — Other Ambulatory Visit (HOSPITAL_COMMUNITY): Payer: Self-pay

## 2022-07-16 ENCOUNTER — Other Ambulatory Visit: Payer: Self-pay

## 2022-07-17 ENCOUNTER — Other Ambulatory Visit: Payer: Self-pay

## 2022-07-17 ENCOUNTER — Other Ambulatory Visit (HOSPITAL_COMMUNITY): Payer: Self-pay

## 2022-07-17 MED ORDER — TIRZEPATIDE 2.5 MG/0.5ML ~~LOC~~ SOAJ
2.5000 mg | SUBCUTANEOUS | 1 refills | Status: DC
  Filled 2022-07-17 – 2022-08-09 (×2): qty 2, 28d supply, fill #0
  Filled 2022-09-12: qty 2, 28d supply, fill #1

## 2022-07-20 ENCOUNTER — Other Ambulatory Visit (HOSPITAL_COMMUNITY): Payer: Self-pay

## 2022-07-20 ENCOUNTER — Other Ambulatory Visit: Payer: Self-pay

## 2022-07-21 ENCOUNTER — Other Ambulatory Visit (HOSPITAL_COMMUNITY): Payer: Self-pay

## 2022-08-06 ENCOUNTER — Other Ambulatory Visit (HOSPITAL_COMMUNITY): Payer: Self-pay

## 2022-08-06 ENCOUNTER — Other Ambulatory Visit: Payer: Self-pay

## 2022-08-06 DIAGNOSIS — E782 Mixed hyperlipidemia: Secondary | ICD-10-CM | POA: Diagnosis not present

## 2022-08-06 DIAGNOSIS — I10 Essential (primary) hypertension: Secondary | ICD-10-CM | POA: Diagnosis not present

## 2022-08-06 DIAGNOSIS — G4709 Other insomnia: Secondary | ICD-10-CM | POA: Diagnosis not present

## 2022-08-06 DIAGNOSIS — G4733 Obstructive sleep apnea (adult) (pediatric): Secondary | ICD-10-CM | POA: Diagnosis not present

## 2022-08-06 DIAGNOSIS — F0630 Mood disorder due to known physiological condition, unspecified: Secondary | ICD-10-CM | POA: Diagnosis not present

## 2022-08-06 DIAGNOSIS — R7303 Prediabetes: Secondary | ICD-10-CM | POA: Diagnosis not present

## 2022-08-06 DIAGNOSIS — M170 Bilateral primary osteoarthritis of knee: Secondary | ICD-10-CM | POA: Diagnosis not present

## 2022-08-06 DIAGNOSIS — Z6841 Body Mass Index (BMI) 40.0 and over, adult: Secondary | ICD-10-CM | POA: Diagnosis not present

## 2022-08-06 DIAGNOSIS — G932 Benign intracranial hypertension: Secondary | ICD-10-CM | POA: Diagnosis not present

## 2022-08-10 ENCOUNTER — Other Ambulatory Visit (HOSPITAL_COMMUNITY): Payer: Self-pay

## 2022-08-10 ENCOUNTER — Other Ambulatory Visit: Payer: Self-pay

## 2022-08-11 ENCOUNTER — Other Ambulatory Visit (HOSPITAL_COMMUNITY): Payer: Self-pay

## 2022-08-11 ENCOUNTER — Other Ambulatory Visit: Payer: Self-pay

## 2022-08-11 MED ORDER — PANTOPRAZOLE SODIUM 40 MG PO TBEC
40.0000 mg | DELAYED_RELEASE_TABLET | Freq: Every day | ORAL | 2 refills | Status: DC
  Filled 2022-08-11: qty 90, 90d supply, fill #0
  Filled 2022-11-23: qty 90, 90d supply, fill #1
  Filled 2023-03-23: qty 90, 90d supply, fill #2

## 2022-08-12 ENCOUNTER — Other Ambulatory Visit (HOSPITAL_COMMUNITY): Payer: Self-pay

## 2022-08-12 ENCOUNTER — Other Ambulatory Visit: Payer: Self-pay

## 2022-08-12 MED ORDER — WEGOVY 0.25 MG/0.5ML ~~LOC~~ SOAJ
0.2500 mg | SUBCUTANEOUS | 1 refills | Status: DC
  Filled 2022-08-12: qty 2, 28d supply, fill #0

## 2022-08-14 ENCOUNTER — Other Ambulatory Visit: Payer: Self-pay

## 2022-08-14 ENCOUNTER — Other Ambulatory Visit (HOSPITAL_BASED_OUTPATIENT_CLINIC_OR_DEPARTMENT_OTHER): Payer: Self-pay

## 2022-08-14 ENCOUNTER — Other Ambulatory Visit (HOSPITAL_COMMUNITY): Payer: Self-pay

## 2022-08-17 ENCOUNTER — Other Ambulatory Visit (HOSPITAL_COMMUNITY): Payer: Self-pay

## 2022-08-19 ENCOUNTER — Other Ambulatory Visit (HOSPITAL_COMMUNITY): Payer: Self-pay

## 2022-08-19 DIAGNOSIS — G4733 Obstructive sleep apnea (adult) (pediatric): Secondary | ICD-10-CM | POA: Diagnosis not present

## 2022-08-25 ENCOUNTER — Other Ambulatory Visit (HOSPITAL_COMMUNITY): Payer: Self-pay

## 2022-08-25 DIAGNOSIS — D235 Other benign neoplasm of skin of trunk: Secondary | ICD-10-CM | POA: Diagnosis not present

## 2022-08-25 DIAGNOSIS — L400 Psoriasis vulgaris: Secondary | ICD-10-CM | POA: Diagnosis not present

## 2022-08-25 DIAGNOSIS — L821 Other seborrheic keratosis: Secondary | ICD-10-CM | POA: Diagnosis not present

## 2022-08-25 DIAGNOSIS — L245 Irritant contact dermatitis due to other chemical products: Secondary | ICD-10-CM | POA: Diagnosis not present

## 2022-08-25 DIAGNOSIS — L853 Xerosis cutis: Secondary | ICD-10-CM | POA: Diagnosis not present

## 2022-08-25 MED ORDER — BETAMETHASONE DIPROPIONATE 0.05 % EX CREA
TOPICAL_CREAM | CUTANEOUS | 2 refills | Status: DC
  Filled 2022-08-25: qty 45, 30d supply, fill #0
  Filled 2022-09-14 – 2022-10-06 (×2): qty 45, 30d supply, fill #1
  Filled 2023-04-29: qty 45, 30d supply, fill #2

## 2022-08-26 ENCOUNTER — Other Ambulatory Visit (HOSPITAL_COMMUNITY): Payer: Self-pay

## 2022-08-26 ENCOUNTER — Other Ambulatory Visit: Payer: Self-pay

## 2022-08-26 MED ORDER — FLUOCINONIDE 0.05 % EX SOLN
CUTANEOUS | 3 refills | Status: AC
  Filled 2022-08-26: qty 60, 30d supply, fill #0
  Filled 2022-09-25: qty 60, 30d supply, fill #1
  Filled 2022-10-06 – 2023-01-06 (×2): qty 60, 30d supply, fill #2
  Filled 2023-04-14: qty 60, 30d supply, fill #3

## 2022-09-06 ENCOUNTER — Other Ambulatory Visit (HOSPITAL_COMMUNITY): Payer: Self-pay

## 2022-09-12 ENCOUNTER — Other Ambulatory Visit (HOSPITAL_COMMUNITY): Payer: Self-pay

## 2022-09-13 MED ORDER — ALBUTEROL SULFATE HFA 108 (90 BASE) MCG/ACT IN AERS
INHALATION_SPRAY | RESPIRATORY_TRACT | 3 refills | Status: AC
  Filled 2022-09-13: qty 6.7, 17d supply, fill #0
  Filled 2022-10-06: qty 6.7, 17d supply, fill #1

## 2022-09-14 ENCOUNTER — Other Ambulatory Visit: Payer: Self-pay

## 2022-09-14 ENCOUNTER — Other Ambulatory Visit (HOSPITAL_COMMUNITY): Payer: Self-pay

## 2022-09-15 ENCOUNTER — Other Ambulatory Visit: Payer: Self-pay

## 2022-09-17 DIAGNOSIS — G4733 Obstructive sleep apnea (adult) (pediatric): Secondary | ICD-10-CM | POA: Diagnosis not present

## 2022-09-23 ENCOUNTER — Other Ambulatory Visit (HOSPITAL_COMMUNITY): Payer: Self-pay

## 2022-09-24 ENCOUNTER — Other Ambulatory Visit (HOSPITAL_COMMUNITY): Payer: Self-pay

## 2022-09-24 MED ORDER — TIRZEPATIDE 2.5 MG/0.5ML ~~LOC~~ SOAJ
2.5000 mg | SUBCUTANEOUS | 1 refills | Status: DC
  Filled 2022-09-24 – 2022-10-10 (×3): qty 2, 28d supply, fill #0

## 2022-09-25 ENCOUNTER — Other Ambulatory Visit (HOSPITAL_COMMUNITY): Payer: Self-pay

## 2022-09-28 ENCOUNTER — Other Ambulatory Visit (HOSPITAL_COMMUNITY): Payer: Self-pay

## 2022-09-28 MED ORDER — MOUNJARO 5 MG/0.5ML ~~LOC~~ SOAJ
5.0000 mg | SUBCUTANEOUS | 3 refills | Status: DC
  Filled 2022-09-28 – 2022-10-10 (×3): qty 2, 28d supply, fill #0
  Filled 2022-10-27 – 2022-11-03 (×3): qty 2, 28d supply, fill #1
  Filled 2022-11-23 – 2022-12-04 (×2): qty 2, 28d supply, fill #2
  Filled 2023-01-06: qty 2, 28d supply, fill #3

## 2022-10-02 ENCOUNTER — Other Ambulatory Visit (HOSPITAL_COMMUNITY): Payer: Self-pay

## 2022-10-05 ENCOUNTER — Other Ambulatory Visit (HOSPITAL_COMMUNITY): Payer: Self-pay

## 2022-10-06 ENCOUNTER — Other Ambulatory Visit: Payer: Self-pay

## 2022-10-10 ENCOUNTER — Other Ambulatory Visit (HOSPITAL_COMMUNITY): Payer: Self-pay

## 2022-10-12 ENCOUNTER — Other Ambulatory Visit (HOSPITAL_COMMUNITY): Payer: Self-pay

## 2022-10-12 MED ORDER — FLUOCINONIDE 0.05 % EX CREA
1.0000 | TOPICAL_CREAM | Freq: Two times a day (BID) | CUTANEOUS | 2 refills | Status: DC
  Filled 2022-10-12: qty 30, 15d supply, fill #0

## 2022-10-18 DIAGNOSIS — G4733 Obstructive sleep apnea (adult) (pediatric): Secondary | ICD-10-CM | POA: Diagnosis not present

## 2022-10-27 DIAGNOSIS — G90512 Complex regional pain syndrome I of left upper limb: Secondary | ICD-10-CM | POA: Diagnosis not present

## 2022-10-27 DIAGNOSIS — I119 Hypertensive heart disease without heart failure: Secondary | ICD-10-CM | POA: Diagnosis not present

## 2022-10-27 DIAGNOSIS — R7303 Prediabetes: Secondary | ICD-10-CM | POA: Diagnosis not present

## 2022-10-27 DIAGNOSIS — Z6839 Body mass index (BMI) 39.0-39.9, adult: Secondary | ICD-10-CM | POA: Diagnosis not present

## 2022-10-27 DIAGNOSIS — G4709 Other insomnia: Secondary | ICD-10-CM | POA: Diagnosis not present

## 2022-10-27 DIAGNOSIS — E782 Mixed hyperlipidemia: Secondary | ICD-10-CM | POA: Diagnosis not present

## 2022-10-27 DIAGNOSIS — M170 Bilateral primary osteoarthritis of knee: Secondary | ICD-10-CM | POA: Diagnosis not present

## 2022-10-27 DIAGNOSIS — G4733 Obstructive sleep apnea (adult) (pediatric): Secondary | ICD-10-CM | POA: Diagnosis not present

## 2022-10-27 DIAGNOSIS — G932 Benign intracranial hypertension: Secondary | ICD-10-CM | POA: Diagnosis not present

## 2022-10-27 DIAGNOSIS — F0630 Mood disorder due to known physiological condition, unspecified: Secondary | ICD-10-CM | POA: Diagnosis not present

## 2022-10-28 ENCOUNTER — Other Ambulatory Visit: Payer: Self-pay

## 2022-10-28 ENCOUNTER — Other Ambulatory Visit (HOSPITAL_COMMUNITY): Payer: Self-pay

## 2022-10-30 ENCOUNTER — Other Ambulatory Visit: Payer: Self-pay

## 2022-11-03 ENCOUNTER — Other Ambulatory Visit: Payer: Self-pay

## 2022-11-05 ENCOUNTER — Other Ambulatory Visit: Payer: Self-pay

## 2022-11-17 DIAGNOSIS — G4733 Obstructive sleep apnea (adult) (pediatric): Secondary | ICD-10-CM | POA: Diagnosis not present

## 2022-11-23 ENCOUNTER — Other Ambulatory Visit (HOSPITAL_COMMUNITY): Payer: Self-pay

## 2022-11-23 ENCOUNTER — Other Ambulatory Visit: Payer: Self-pay

## 2022-11-23 MED ORDER — POTASSIUM CHLORIDE ER 20 MEQ PO TBCR
20.0000 meq | EXTENDED_RELEASE_TABLET | Freq: Two times a day (BID) | ORAL | 3 refills | Status: DC
  Filled 2022-11-23: qty 180, 90d supply, fill #0
  Filled 2023-08-16: qty 180, 90d supply, fill #1

## 2022-11-23 MED ORDER — MONTELUKAST SODIUM 10 MG PO TABS
10.0000 mg | ORAL_TABLET | Freq: Every day | ORAL | 2 refills | Status: DC
  Filled 2022-11-23: qty 90, 90d supply, fill #0
  Filled 2023-01-06 – 2023-03-23 (×2): qty 90, 90d supply, fill #1
  Filled 2023-06-21: qty 90, 90d supply, fill #2

## 2022-11-24 ENCOUNTER — Other Ambulatory Visit: Payer: Self-pay

## 2022-12-04 ENCOUNTER — Other Ambulatory Visit: Payer: Self-pay

## 2022-12-04 ENCOUNTER — Other Ambulatory Visit (HOSPITAL_COMMUNITY): Payer: Self-pay

## 2022-12-04 DIAGNOSIS — Z6839 Body mass index (BMI) 39.0-39.9, adult: Secondary | ICD-10-CM | POA: Diagnosis not present

## 2022-12-04 DIAGNOSIS — G90512 Complex regional pain syndrome I of left upper limb: Secondary | ICD-10-CM | POA: Diagnosis not present

## 2022-12-04 DIAGNOSIS — G4733 Obstructive sleep apnea (adult) (pediatric): Secondary | ICD-10-CM | POA: Diagnosis not present

## 2022-12-04 DIAGNOSIS — I119 Hypertensive heart disease without heart failure: Secondary | ICD-10-CM | POA: Diagnosis not present

## 2022-12-04 DIAGNOSIS — H65191 Other acute nonsuppurative otitis media, right ear: Secondary | ICD-10-CM | POA: Diagnosis not present

## 2022-12-04 DIAGNOSIS — M170 Bilateral primary osteoarthritis of knee: Secondary | ICD-10-CM | POA: Diagnosis not present

## 2022-12-04 DIAGNOSIS — F0630 Mood disorder due to known physiological condition, unspecified: Secondary | ICD-10-CM | POA: Diagnosis not present

## 2022-12-04 DIAGNOSIS — J0110 Acute frontal sinusitis, unspecified: Secondary | ICD-10-CM | POA: Diagnosis not present

## 2022-12-04 DIAGNOSIS — G932 Benign intracranial hypertension: Secondary | ICD-10-CM | POA: Diagnosis not present

## 2022-12-04 DIAGNOSIS — E782 Mixed hyperlipidemia: Secondary | ICD-10-CM | POA: Diagnosis not present

## 2022-12-04 MED ORDER — AMOXICILLIN-POT CLAVULANATE 875-125 MG PO TABS
1.0000 | ORAL_TABLET | Freq: Two times a day (BID) | ORAL | 0 refills | Status: DC
  Filled 2022-12-04: qty 20, 10d supply, fill #0

## 2022-12-17 DIAGNOSIS — M79641 Pain in right hand: Secondary | ICD-10-CM | POA: Diagnosis not present

## 2022-12-17 DIAGNOSIS — J0110 Acute frontal sinusitis, unspecified: Secondary | ICD-10-CM | POA: Diagnosis not present

## 2022-12-17 DIAGNOSIS — M25532 Pain in left wrist: Secondary | ICD-10-CM | POA: Diagnosis not present

## 2022-12-17 DIAGNOSIS — G5601 Carpal tunnel syndrome, right upper limb: Secondary | ICD-10-CM | POA: Diagnosis not present

## 2022-12-17 DIAGNOSIS — Z6839 Body mass index (BMI) 39.0-39.9, adult: Secondary | ICD-10-CM | POA: Diagnosis not present

## 2022-12-17 DIAGNOSIS — G4733 Obstructive sleep apnea (adult) (pediatric): Secondary | ICD-10-CM | POA: Diagnosis not present

## 2022-12-17 DIAGNOSIS — G90512 Complex regional pain syndrome I of left upper limb: Secondary | ICD-10-CM | POA: Diagnosis not present

## 2022-12-17 DIAGNOSIS — M79642 Pain in left hand: Secondary | ICD-10-CM | POA: Diagnosis not present

## 2022-12-17 DIAGNOSIS — G932 Benign intracranial hypertension: Secondary | ICD-10-CM | POA: Diagnosis not present

## 2022-12-17 DIAGNOSIS — R7303 Prediabetes: Secondary | ICD-10-CM | POA: Diagnosis not present

## 2022-12-17 DIAGNOSIS — H65191 Other acute nonsuppurative otitis media, right ear: Secondary | ICD-10-CM | POA: Diagnosis not present

## 2022-12-17 DIAGNOSIS — M25531 Pain in right wrist: Secondary | ICD-10-CM | POA: Diagnosis not present

## 2022-12-18 DIAGNOSIS — G4733 Obstructive sleep apnea (adult) (pediatric): Secondary | ICD-10-CM | POA: Diagnosis not present

## 2022-12-31 DIAGNOSIS — G5603 Carpal tunnel syndrome, bilateral upper limbs: Secondary | ICD-10-CM | POA: Diagnosis not present

## 2023-01-01 ENCOUNTER — Other Ambulatory Visit: Payer: Self-pay | Admitting: Orthopedic Surgery

## 2023-01-06 ENCOUNTER — Other Ambulatory Visit (HOSPITAL_COMMUNITY): Payer: Self-pay

## 2023-01-06 ENCOUNTER — Other Ambulatory Visit: Payer: Self-pay

## 2023-01-06 ENCOUNTER — Encounter: Payer: Self-pay | Admitting: Pharmacist

## 2023-01-06 MED ORDER — AMOXICILLIN-POT CLAVULANATE 875-125 MG PO TABS
1.0000 | ORAL_TABLET | Freq: Two times a day (BID) | ORAL | 0 refills | Status: DC
  Filled 2023-01-06: qty 20, 10d supply, fill #0

## 2023-01-11 ENCOUNTER — Other Ambulatory Visit (HOSPITAL_COMMUNITY): Payer: Self-pay

## 2023-01-17 DIAGNOSIS — G4733 Obstructive sleep apnea (adult) (pediatric): Secondary | ICD-10-CM | POA: Diagnosis not present

## 2023-01-21 ENCOUNTER — Other Ambulatory Visit (HOSPITAL_COMMUNITY): Payer: Self-pay

## 2023-01-21 MED ORDER — TIRZEPATIDE 5 MG/0.5ML ~~LOC~~ SOAJ
5.0000 mg | SUBCUTANEOUS | 3 refills | Status: DC
  Filled 2023-01-21 – 2023-02-03 (×2): qty 2, 28d supply, fill #0
  Filled 2023-03-01: qty 2, 28d supply, fill #1
  Filled 2023-03-23 – 2023-03-26 (×2): qty 2, 28d supply, fill #2
  Filled 2023-04-29: qty 2, 28d supply, fill #3

## 2023-01-22 ENCOUNTER — Other Ambulatory Visit (HOSPITAL_COMMUNITY): Payer: Self-pay

## 2023-01-28 ENCOUNTER — Encounter: Payer: Self-pay | Admitting: Neurology

## 2023-01-28 ENCOUNTER — Other Ambulatory Visit: Payer: Self-pay

## 2023-01-28 ENCOUNTER — Other Ambulatory Visit (HOSPITAL_COMMUNITY): Payer: Self-pay

## 2023-01-28 ENCOUNTER — Ambulatory Visit: Payer: Commercial Managed Care - PPO | Admitting: Neurology

## 2023-01-28 VITALS — BP 109/71 | HR 90 | Ht 64.0 in | Wt 236.8 lb

## 2023-01-28 DIAGNOSIS — G4733 Obstructive sleep apnea (adult) (pediatric): Secondary | ICD-10-CM

## 2023-01-28 DIAGNOSIS — G932 Benign intracranial hypertension: Secondary | ICD-10-CM | POA: Diagnosis not present

## 2023-01-28 DIAGNOSIS — G43019 Migraine without aura, intractable, without status migrainosus: Secondary | ICD-10-CM

## 2023-01-28 MED ORDER — ACETAZOLAMIDE 250 MG PO TABS
ORAL_TABLET | ORAL | 3 refills | Status: DC
  Filled 2023-01-28 – 2023-02-03 (×2): qty 270, 90d supply, fill #0
  Filled 2023-05-26: qty 270, 90d supply, fill #1
  Filled 2023-09-09: qty 270, 90d supply, fill #2

## 2023-01-28 MED ORDER — RIZATRIPTAN BENZOATE 10 MG PO TABS
ORAL_TABLET | ORAL | 5 refills | Status: DC
  Filled 2023-01-28: qty 10, 30d supply, fill #0

## 2023-01-28 MED ORDER — VENLAFAXINE HCL ER 75 MG PO CP24
75.0000 mg | ORAL_CAPSULE | Freq: Every day | ORAL | 3 refills | Status: DC
  Filled 2023-01-28 – 2023-02-03 (×2): qty 90, 90d supply, fill #0
  Filled 2023-04-29: qty 90, 90d supply, fill #1
  Filled 2023-08-16: qty 90, 90d supply, fill #2
  Filled 2023-12-04: qty 90, 90d supply, fill #3

## 2023-01-28 NOTE — Progress Notes (Signed)
PATIENT: [name] DOB: October 09, 1965  REASON FOR VISIT: follow up for migraines  HISTORY FROM: patient PRIMARY NEUROLOGIST: [name]  HISTORY OF PRESENT ILLNESS: Today 01/28/23  Migraines doing well, 1-2 per month. Taking Diamox lower dose 250/500, tolerating well. Occasionally have doesn't lift right leg up high enough, sometimes balance issue. Remains on Effexor XR 75 mg daily. On Mounjaro 40 lbs weight loss. Takes Maxalt as needed, with good benefit. Works nightshift as [name]. May miss work 1 day every 3 months due to loopy feeling, nauseated from migraine. Seeing eye doctor soon, Dr. Hyacinth Gregory. obstructive sleep apnea (overall mild, severe REM related), on treatment with CPAP.  CPAP card download 68/90 days at 76%, greater than 4 hours 25 days at 28%.  Average usage days used 3 hours 11 minutes.  Set pressure 7 cm water.  Leak 28.6.  AHI 0.2.  Feels benefit from CPAP, when she does not use will have morning headache.  Uses fullface mask.  Does wonder if her CPAP has changed with her 40 pound weight loss. Sleep patterns vary with working nightshift.   07/15/22 SS: Working night shift as [name] in the [employer]. Tried to lower Diamox from 500 mg BID, to 250 mg BID but felt off balance and fluid retention. Back on Diamox 500 mg BID. If she stands too quickly, can feel dizzy. Takes Effexor XR 75 mg daily. About 2 migraines a month. Often stress induced. Has Maxalt if needed, rarely takes. Is on Mounjaro for weight loss, 10 lbs weight loss last 3 months. Is due for eye exam. Overall doing well. Didn't bring CPAP, we couldn't pull a download.  Update 12/30/21 SS: [name] is here today for follow-up.  Has not been seen since December 2021. Using CPAP, can't pull download, no data from April 2022, maybe not transmitting? Is RN, Working night shift again, had few more headaches then. Has lost 40 lbs over the last year. Feels migraines are better. On Saxenda. No more than 2 migraines a month.  Maxalt works well, rarely takes. On Effexor XR 75 mg daily, Diamox 500 mg twice daily. Reports saw eye doctor recently, no papilledema. Got new glasses. Dr. Hyacinth Gregory. 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   Update 06/19/2020 SS: [name] is a 57 year old female with history of obesity, possible pseudotumor cerebri, and migraine headaches.  She is on Diamox and Effexor.  She is on CPAP.  Headaches remain well controlled.  She may have no more than 2 migraines a month, sometimes none.  Usually brought on by stress.  For headache, Maxalt is beneficial, but laying down and sleeping works the best.  She is a Engineer, civil (consulting), has FMLA, she may miss 1 or 2 days every 3 or so months.  Since 2014, has some occasional dizziness with standing, may feel she leans to the right, her balance is not as good.  Occasionally hears a swishing to her right ear, generally well controlled when her weight is maintained, she stays hydrated, and takes her medications.  She has yet to see her ophthalmologist.  Previously she has tried to wean off Diamox, and her headaches return.  CPAP is quite beneficial for headaches.  Presents today for evaluation unaccompanied.  HISTORY 07/11/2019 SS: [name] is a 57 year old female with history of obesity, possible pseudotumor cerebri.  She remains on Diamox and Effexor.  She reported previously that her eye doctor has found that her papilledema had disappeared.  She reports she is  due for a repeat eye visit in the next several months.  She indicates her headaches have been doing very well, this is the best she has felt in a long time.  She reports on average she may have 2 migraines a month.  She says she is not having to take Maxalt as often.  She works as a Engineer, civil (consulting), she says she may have to miss 1 day of work every 3 months.  She finds Maxalt to be beneficial, along with Zofran, but afterwards she feels drowsy and needs to lie down.  She remains on CPAP.  She presents today for  evaluation via virtual visit.   REVIEW OF SYSTEMS: Out of a complete 14 system review of symptoms, the patient complains only of the following symptoms, and all other reviewed systems are negative.  See HPI  ALLERGIES: Allergies  Allergen Reactions   Benazepril-Hydrochlorothiazide Swelling    angioedema   Codeine Nausea And Vomiting   Hydrocodone Itching   HOME MEDICATIONS: Outpatient Medications Prior to Visit  Medication Sig Dispense Refill   albuterol (VENTOLIN HFA) 108 (90 Base) MCG/ACT inhaler Inhale 2 puffs by mouth every 4 hours as needed for wheezing 6.7 g 3   amoxicillin-clavulanate (AUGMENTIN) 875-125 MG tablet Take 1 tablet by mouth 2 (two) times daily. 20 tablet 0   betamethasone dipropionate 0.05 % cream Apply a small amount to affected area twice a day ,for stubborn areas. 45 g 2   cetirizine (ZYRTEC) 10 MG tablet Take 10 mg by mouth daily.     Cholecalciferol (VITAMIN D3) 1.25 MG (50000 UT) CAPS Take 1 capsule by mouth once a week. 12 capsule 3   estradiol (ESTRACE VAGINAL) 0.1 MG/GM vaginal cream Insert 0.5 grams vaginally twice weekly as directed 60 g 11   famotidine (PEPCID) 20 MG tablet Take 20 mg by mouth 2 (two) times daily.      fluconazole (DIFLUCAN) 150 MG tablet Take 1 tablet (150 mg total) by mouth as directed. Repeat in 3 days as directed 2 tablet 0   fluocinonide (LIDEX) 0.05 % external solution Apply a small amount to scalp twice daily as directed as needed for flares 60 mL 4   fluocinonide (LIDEX) 0.05 % external solution Apply a small amount topically twice a day as needed for flares 60 mL 3   fluocinonide cream (LIDEX) 0.05 % Apply a small amount to the affected area of skin on back and left arm 2 (two) times daily as directed 30 g 2   fluticasone (FLONASE) 50 MCG/ACT nasal spray Place 2 sprays into both nostrils daily. 1 g 0   hydrOXYzine (ATARAX) 25 MG tablet Take 1 tablet (25 mg total) by mouth at bedtime as needed. 90 tablet 2   ipratropium (ATROVENT)  0.06 % nasal spray Place 2 sprays into both nostrils 3 (three) times daily. 15 mL 2   magnesium oxide (MAG-OX) 400 (240 Mg) MG tablet Take 1 tablet (400 mg total) by mouth 2 (two) times daily. 180 tablet 3   mometasone (ELOCON) 0.1 % cream Apply a small amount/thin layer to affected area twice a day as needed for flares 45 g 3   mometasone (ELOCON) 0.1 % cream Apply small thin amount topically twice daily as needed for flares 45 g 0   montelukast (SINGULAIR) 10 MG tablet Take 1 tablet (10 mg total) by mouth daily. 90 tablet 2   nebivolol (BYSTOLIC) 5 MG tablet Take 1 tablet (5 mg total) by mouth daily. 90 tablet 3  nystatin-triamcinolone (MYCOLOG II) cream Apply to affected area topically twice a day as needed for  7 days 30 g 1   nystatin-triamcinolone ointment (MYCOLOG) Apply to the affected area(s) of skin twice a day for 2 weeks then as needed 30 g 3   pantoprazole (PROTONIX) 40 MG tablet Take 1 tablet (40 mg total) by mouth daily 20 minutes before breakfast. 90 tablet 2   Potassium Chloride ER 20 MEQ TBCR Take 1 tablet by mouth twice daily with food 180 tablet 3   sennosides-docusate sodium (SENOKOT-S) 8.6-50 MG tablet Take 2 tablets by mouth as needed.      tirzepatide [facility]) 5 MG/0.5ML Pen Inject 5 mg into the skin once a week. 2 mL 3   triamterene-hydrochlorothiazide (MAXZIDE) 75-50 MG tablet Take 1 tablet by mouth daily. (stop losartan) 90 tablet 3   Vitamin D, Ergocalciferol, (DRISDOL) 50000 units CAPS capsule Take 50,000 Units by mouth every 7 (seven) days. Every Sunday.     acetaZOLAMIDE (DIAMOX) 250 MG tablet Take 1 tablet (250 mg total) by mouth in the morning AND 2 tablets (500 mg total) every evening. 270 tablet 3   venlafaxine XR (EFFEXOR-XR) 75 MG 24 hr capsule Take 1 capsule (75 mg total) by mouth daily. 90 capsule 3   magnesium oxide (MAG-OX) 400 (240 Mg) MG tablet TAKE 1 TABLET BY MOUTH TWICE DAILY 180 tablet 3   rizatriptan (MAXALT) 10 MG tablet TAKE 1 TABLET BY MOUTH 3  TIMES DAILY AS NEEDED FOR MIGRAINE AS DIRECTED 10 tablet 5   Semaglutide-Weight Management (WEGOVY) 0.25 MG/0.5ML SOAJ Inject 0.25 mg into the skin once a week. 2 mL 1   Semaglutide-Weight Management (WEGOVY) 0.25 MG/0.5ML SOAJ Inject 0.25 mg into the skin every 7 (seven) days. 2 mL 1   No facility-administered medications prior to visit.    PAST MEDICAL HISTORY: Past Medical History:  Diagnosis Date   Allergy    takes Zyrtec daily   Arthritis    Back pain    arthritis   Benign intracranial hypertension    Chronic constipation    takes Stool Softener   Dizziness    Eczema    Family history of anesthesia complication    mother got confused some after anesthesia   Gastroesophageal reflux disease    takes Pepcid daily   Headache(784.0)    benign intercranial HTN d/t CSF leak, migraines   History of bronchitis    pt states a very long time ago   History of MRSA infection    > 27yrs ago   Hypertension    takes Lotensin daily   Lumbago    Obesity, unspecified    Peripheral edema    Pulsatile tinnitus    Sleep apnea    Unspecified vitamin D deficiency     PAST SURGICAL HISTORY: Past Surgical History:  Procedure Laterality Date   ANTERIOR AND POSTERIOR REPAIR N/A 10/10/2015   Procedure: ANTERIOR (CYSTOCELE) AND POSTERIOR REPAIR (RECTOCELE);  Surgeon: Alfredo Martinez, MD;  Location: [facility];  Service: Urology;  Laterality: N/A;   CARDIOVASCULAR STRESS TEST     CESAREAN SECTION  23yrs ago   CHOLECYSTECTOMY     CYSTO N/A 10/10/2015   Procedure: CYSTO;  Surgeon: Alfredo Martinez, MD;  Location: [facility];  Service: Urology;  Laterality: N/A;   GALLBLADDER SURGERY  56yrs ago   SINUS ENDO W/FUSION Bilateral 02/03/2013   Procedure: ENDOSCOPIC SINUS SURGERY WITH FUSION NAVIGATION;  Surgeon: Melvenia Beam, MD;  Location: [facility] OR;  Service: ENT;  Laterality:  Bilateral;  Repaired CSF Leak   US ECHOCARDIOGRAPHY     VAGINAL HYSTERECTOMY N/A 10/10/2015   Procedure: HYSTERECTOMY VAGINAL;  Surgeon:  Maxie Better, MD;  Location: [facility];  Service: Gynecology;  Laterality: N/A;   VAGINAL PROLAPSE REPAIR N/A 10/10/2015   Procedure: VAULT PROLAPSE AND GRAFT;  Surgeon: Alfredo Martinez, MD;  Location: [facility];  Service: Urology;  Laterality: N/A;    FAMILY HISTORY: Family History  Problem Relation Age of Onset   Cancer - Prostate Father    Hypertension Father    Hypertension Mother    Diabetes Mother    Heart attack Neg Hx    Hyperlipidemia Neg Hx    Sudden death Neg Hx     SOCIAL HISTORY: Social History   Socioeconomic History   Marital status: Married    Spouse name: Alinda Money   Number of children: 2   Years of education: 12+   Highest education level: Bachelor's degree (e.g., BA, AB, BS)  Occupational History    Employer: [employer]  Tobacco Use   Smoking status: Never   Smokeless tobacco: Never  Vaping Use   Vaping status: Never Used  Substance and Sexual Activity   Alcohol use: No    Alcohol/week: 0.0 standard drinks of alcohol   Drug use: No   Sexual activity: Not Currently  Other Topics Concern   Not on file  Social History Narrative   Patient lives at home with husband and son. Alinda Money)   Patient has 2 children.    Patient is currently working as an [name].   Patient has a college education.    Patient is right handed.    Consumes drinks 1 16oz soda 3 times a week, rarely drinks coffee.    Works night shift   Social Determinants of Corporate investment banker Strain: Not on file  Food Insecurity: Not on file  Transportation Needs: Not on file  Physical Activity: Not on file  Stress: Not on file  Social Connections: Unknown (11/10/2021)   Received from [facility]   Social Network    Social Network: Not on file  Intimate Partner Violence: Unknown (11/10/2021)   Received from [facility]   HITS    Physically Hurt: Not on file    Insult or Talk Down To: Not on file    Threaten Physical Harm: Not on file    Scream or Curse: Not on file    PHYSICAL  EXAM  Vitals:   01/28/23 1403  BP: 109/71  Pulse: 90  Weight: 236 lb 12.4 oz (107.4 kg)  Height: 5\' 4"  (1.626 m)    Body mass index is 40.64 kg/m.  Generalized: Well developed, in no acute distress   Neurological examination  Mentation: Alert oriented to time, place, history taking. Follows all commands speech and language fluent Cranial nerve II-XII: Pupils were equal round reactive to light. Extraocular movements were full, visual field were full on confrontational test. Facial sensation and strength were normal.  Head turning and shoulder shrug  were normal and symmetric. Motor: The motor testing reveals 5 over 5 strength of all 4 extremities. Good symmetric motor tone is noted throughout.  Sensory: Sensory testing is intact to soft touch on all 4 extremities. No evidence of extinction is noted.  Coordination: Cerebellar testing reveals good finger-nose-finger and heel-to-shin bilaterally.  Gait and station: Gait is normal.   Reflexes: Deep tendon reflexes are symmetric and normal bilaterally.   DIAGNOSTIC DATA (LABS, IMAGING, TESTING) - I reviewed patient  records, labs, notes, testing and imaging myself where available.  Lab Results  Component Value Date   WBC 10.4 10/11/2015   HGB 12.3 10/11/2015   HCT 37.8 10/11/2015   MCV 81.8 10/11/2015   PLT 369 10/11/2015      Component Value Date/Time   NA 134 (L) 10/11/2015 0530   NA 140 12/10/2011 0000   K 3.3 (L) 10/11/2015 0530   CL 103 10/11/2015 0530   CO2 23 10/11/2015 0530   GLUCOSE 107 (H) 10/11/2015 0530   BUN 13 10/11/2015 0530   BUN 11 12/10/2011 0000   CREATININE 0.77 10/11/2015 0530   CALCIUM 8.5 (L) 10/11/2015 0530   PROT 6.4 02/05/2013 0502   ALBUMIN 3.1 (L) 02/05/2013 0502   AST 20 02/05/2013 0502   ALT 12 02/05/2013 0502   ALKPHOS 77 02/05/2013 0502   BILITOT 0.4 02/05/2013 0502   GFRNONAA >60 10/11/2015 0530   GFRAA >60 10/11/2015 0530   No results found for: "CHOL", "HDL", "LDLCALC", "LDLDIRECT",  "TRIG", "CHOLHDL" No results found for: "HGBA1C" No results found for: "VITAMINB12" No results found for: "TSH"  ASSESSMENT AND PLAN 57 y.o. year old female   1.  Pseudotumor cerebri 2.  Migraine headache -Under good control, continue lower dose Diamox 250/500 mg daily, Effexor XR 75 mg daily -Continue Maxalt 10 mg as needed for acute headache -Due for follow-up with ophthalmology -Has done excellent to lose 40 pounds -Follow up in 1 year   3. OSA on CPAP -Order home sleep study for reevaluation of sleep apnea with 40 pound weight loss. ESS 0 -Download shows overall suboptimal usage, sleep patterns vary as she works night shift -Sleep study was in 2016 obstructive sleep apnea (overall mild, severe REM related), on treatment with CPAP.  Margie Ege, AGNP-C, DNP 01/28/2023, 2:29 PM [facility] [address], [address] [place], [place] [address] [phone]

## 2023-01-28 NOTE — Patient Instructions (Signed)
Order home sleep study for re-evaluation of sleep apnea Follow up with your eye doctor due for exam Continue current medications

## 2023-01-29 ENCOUNTER — Other Ambulatory Visit: Payer: Self-pay

## 2023-02-03 ENCOUNTER — Other Ambulatory Visit (HOSPITAL_COMMUNITY): Payer: Self-pay

## 2023-02-03 ENCOUNTER — Other Ambulatory Visit: Payer: Self-pay

## 2023-02-08 DIAGNOSIS — G90512 Complex regional pain syndrome I of left upper limb: Secondary | ICD-10-CM | POA: Diagnosis not present

## 2023-02-08 DIAGNOSIS — E782 Mixed hyperlipidemia: Secondary | ICD-10-CM | POA: Diagnosis not present

## 2023-02-08 DIAGNOSIS — H65191 Other acute nonsuppurative otitis media, right ear: Secondary | ICD-10-CM | POA: Diagnosis not present

## 2023-02-08 DIAGNOSIS — Z6838 Body mass index (BMI) 38.0-38.9, adult: Secondary | ICD-10-CM | POA: Diagnosis not present

## 2023-02-08 DIAGNOSIS — R7303 Prediabetes: Secondary | ICD-10-CM | POA: Diagnosis not present

## 2023-02-08 DIAGNOSIS — J0110 Acute frontal sinusitis, unspecified: Secondary | ICD-10-CM | POA: Diagnosis not present

## 2023-02-08 DIAGNOSIS — M170 Bilateral primary osteoarthritis of knee: Secondary | ICD-10-CM | POA: Diagnosis not present

## 2023-02-08 DIAGNOSIS — I119 Hypertensive heart disease without heart failure: Secondary | ICD-10-CM | POA: Diagnosis not present

## 2023-02-08 DIAGNOSIS — G932 Benign intracranial hypertension: Secondary | ICD-10-CM | POA: Diagnosis not present

## 2023-02-08 DIAGNOSIS — F0630 Mood disorder due to known physiological condition, unspecified: Secondary | ICD-10-CM | POA: Diagnosis not present

## 2023-02-08 DIAGNOSIS — G4733 Obstructive sleep apnea (adult) (pediatric): Secondary | ICD-10-CM | POA: Diagnosis not present

## 2023-02-15 DIAGNOSIS — G4733 Obstructive sleep apnea (adult) (pediatric): Secondary | ICD-10-CM | POA: Diagnosis not present

## 2023-02-16 ENCOUNTER — Telehealth: Payer: Self-pay | Admitting: Neurology

## 2023-02-16 ENCOUNTER — Other Ambulatory Visit (HOSPITAL_COMMUNITY): Payer: Self-pay | Admitting: Gastroenterology

## 2023-02-16 ENCOUNTER — Ambulatory Visit (HOSPITAL_BASED_OUTPATIENT_CLINIC_OR_DEPARTMENT_OTHER)
Admission: RE | Admit: 2023-02-16 | Discharge: 2023-02-16 | Disposition: A | Discharge status: Home / Self Care | Payer: Commercial Managed Care - PPO | Source: Ambulatory Visit | Attending: Gastroenterology | Admitting: Gastroenterology

## 2023-02-16 DIAGNOSIS — K219 Gastro-esophageal reflux disease without esophagitis: Secondary | ICD-10-CM | POA: Diagnosis not present

## 2023-02-16 DIAGNOSIS — R1013 Epigastric pain: Secondary | ICD-10-CM | POA: Diagnosis not present

## 2023-02-16 DIAGNOSIS — K625 Hemorrhage of anus and rectum: Secondary | ICD-10-CM | POA: Diagnosis not present

## 2023-02-16 DIAGNOSIS — K5900 Constipation, unspecified: Secondary | ICD-10-CM | POA: Diagnosis not present

## 2023-02-16 DIAGNOSIS — R1033 Periumbilical pain: Secondary | ICD-10-CM | POA: Diagnosis not present

## 2023-02-16 DIAGNOSIS — N281 Cyst of kidney, acquired: Secondary | ICD-10-CM | POA: Diagnosis not present

## 2023-02-16 DIAGNOSIS — K7689 Other specified diseases of liver: Secondary | ICD-10-CM | POA: Diagnosis not present

## 2023-02-16 DIAGNOSIS — K5730 Diverticulosis of large intestine without perforation or abscess without bleeding: Secondary | ICD-10-CM | POA: Diagnosis not present

## 2023-02-16 MED ORDER — IOHEXOL 300 MG/ML  SOLN
100.0000 mL | Freq: Once | INTRAMUSCULAR | Status: AC | PRN
  Administered 2023-02-16: 100 mL via INTRAVENOUS

## 2023-02-16 NOTE — Telephone Encounter (Signed)
02/09/23: LVM ag  02/03/23 [facility] aetna no auth req EE

## 2023-03-02 ENCOUNTER — Other Ambulatory Visit (HOSPITAL_COMMUNITY): Payer: Self-pay

## 2023-03-02 ENCOUNTER — Other Ambulatory Visit: Payer: Self-pay

## 2023-03-03 ENCOUNTER — Other Ambulatory Visit (HOSPITAL_COMMUNITY): Payer: Self-pay

## 2023-03-15 ENCOUNTER — Other Ambulatory Visit (HOSPITAL_COMMUNITY): Payer: Self-pay

## 2023-03-23 ENCOUNTER — Other Ambulatory Visit: Payer: Self-pay

## 2023-03-24 ENCOUNTER — Other Ambulatory Visit (HOSPITAL_COMMUNITY): Payer: Self-pay

## 2023-03-26 ENCOUNTER — Other Ambulatory Visit (HOSPITAL_COMMUNITY): Payer: Self-pay

## 2023-04-14 ENCOUNTER — Other Ambulatory Visit: Payer: Self-pay | Admitting: Internal Medicine

## 2023-04-14 ENCOUNTER — Other Ambulatory Visit: Payer: Self-pay

## 2023-04-14 DIAGNOSIS — Z1231 Encounter for screening mammogram for malignant neoplasm of breast: Secondary | ICD-10-CM

## 2023-04-19 ENCOUNTER — Other Ambulatory Visit: Payer: Self-pay | Admitting: Orthopedic Surgery

## 2023-04-29 ENCOUNTER — Other Ambulatory Visit: Payer: Self-pay

## 2023-05-03 ENCOUNTER — Other Ambulatory Visit: Payer: Self-pay

## 2023-05-03 ENCOUNTER — Other Ambulatory Visit (HOSPITAL_COMMUNITY): Payer: Self-pay

## 2023-05-03 DIAGNOSIS — L408 Other psoriasis: Secondary | ICD-10-CM | POA: Diagnosis not present

## 2023-05-03 DIAGNOSIS — L409 Psoriasis, unspecified: Secondary | ICD-10-CM | POA: Diagnosis not present

## 2023-05-03 DIAGNOSIS — L219 Seborrheic dermatitis, unspecified: Secondary | ICD-10-CM | POA: Diagnosis not present

## 2023-05-03 MED ORDER — TRIAMCINOLONE ACETONIDE 0.1 % EX CREA
TOPICAL_CREAM | CUTANEOUS | 1 refills | Status: DC
  Filled 2023-05-03: qty 453, 30d supply, fill #0
  Filled 2023-05-04: qty 454, 90d supply, fill #0
  Filled 2023-10-17: qty 400, 90d supply, fill #1

## 2023-05-03 MED ORDER — CLOBETASOL PROP EMOLLIENT BASE 0.05 % EX CREA
TOPICAL_CREAM | CUTANEOUS | 1 refills | Status: DC
  Filled 2023-05-03: qty 45, 30d supply, fill #0

## 2023-05-03 MED ORDER — FLUOCINOLONE ACETONIDE SCALP 0.01 % EX OIL
TOPICAL_OIL | CUTANEOUS | 5 refills | Status: AC
  Filled 2023-05-03 – 2023-05-04 (×2): qty 118.28, 30d supply, fill #0
  Filled 2023-09-09: qty 118.28, 30d supply, fill #1
  Filled 2023-10-17: qty 118.28, 30d supply, fill #2

## 2023-05-03 MED ORDER — HYDROCORTISONE 2.5 % EX CREA
TOPICAL_CREAM | CUTANEOUS | 2 refills | Status: AC
  Filled 2023-05-03 – 2023-05-04 (×2): qty 30, 30d supply, fill #0
  Filled 2023-10-17: qty 30, 30d supply, fill #1

## 2023-05-03 MED ORDER — SILVER SULFADIAZINE 1 % EX CREA
TOPICAL_CREAM | CUTANEOUS | 0 refills | Status: AC
  Filled 2023-05-03: qty 400, 30d supply, fill #0

## 2023-05-04 ENCOUNTER — Other Ambulatory Visit (HOSPITAL_COMMUNITY): Payer: Self-pay

## 2023-05-04 ENCOUNTER — Other Ambulatory Visit: Payer: Self-pay

## 2023-05-05 ENCOUNTER — Other Ambulatory Visit: Payer: Self-pay

## 2023-05-06 ENCOUNTER — Other Ambulatory Visit (HOSPITAL_COMMUNITY): Payer: Self-pay

## 2023-05-06 ENCOUNTER — Other Ambulatory Visit: Payer: Self-pay

## 2023-05-06 MED ORDER — ZORYVE 0.3 % EX CREA
TOPICAL_CREAM | CUTANEOUS | 3 refills | Status: DC
  Filled 2023-05-06: qty 60, 30d supply, fill #0

## 2023-05-17 ENCOUNTER — Other Ambulatory Visit (HOSPITAL_COMMUNITY): Payer: Self-pay

## 2023-05-17 DIAGNOSIS — G4733 Obstructive sleep apnea (adult) (pediatric): Secondary | ICD-10-CM | POA: Diagnosis not present

## 2023-05-25 ENCOUNTER — Encounter: Payer: Self-pay | Admitting: Pharmacist

## 2023-05-25 ENCOUNTER — Other Ambulatory Visit: Payer: Self-pay

## 2023-05-25 ENCOUNTER — Other Ambulatory Visit (HOSPITAL_COMMUNITY): Payer: Self-pay

## 2023-05-25 DIAGNOSIS — G4733 Obstructive sleep apnea (adult) (pediatric): Secondary | ICD-10-CM | POA: Diagnosis not present

## 2023-05-25 DIAGNOSIS — J0110 Acute frontal sinusitis, unspecified: Secondary | ICD-10-CM | POA: Diagnosis not present

## 2023-05-25 DIAGNOSIS — I119 Hypertensive heart disease without heart failure: Secondary | ICD-10-CM | POA: Diagnosis not present

## 2023-05-25 DIAGNOSIS — G90512 Complex regional pain syndrome I of left upper limb: Secondary | ICD-10-CM | POA: Diagnosis not present

## 2023-05-25 DIAGNOSIS — G932 Benign intracranial hypertension: Secondary | ICD-10-CM | POA: Diagnosis not present

## 2023-05-25 DIAGNOSIS — K5730 Diverticulosis of large intestine without perforation or abscess without bleeding: Secondary | ICD-10-CM | POA: Diagnosis not present

## 2023-05-25 DIAGNOSIS — H65191 Other acute nonsuppurative otitis media, right ear: Secondary | ICD-10-CM | POA: Diagnosis not present

## 2023-05-25 DIAGNOSIS — Z6838 Body mass index (BMI) 38.0-38.9, adult: Secondary | ICD-10-CM | POA: Diagnosis not present

## 2023-05-25 DIAGNOSIS — E782 Mixed hyperlipidemia: Secondary | ICD-10-CM | POA: Diagnosis not present

## 2023-05-25 DIAGNOSIS — R7303 Prediabetes: Secondary | ICD-10-CM | POA: Diagnosis not present

## 2023-05-25 DIAGNOSIS — M170 Bilateral primary osteoarthritis of knee: Secondary | ICD-10-CM | POA: Diagnosis not present

## 2023-05-25 MED ORDER — WEGOVY 0.5 MG/0.5ML ~~LOC~~ SOAJ
0.5000 mL | SUBCUTANEOUS | 3 refills | Status: DC
Start: 2023-05-25 — End: 2024-02-01
  Filled 2023-05-25: qty 2, 28d supply, fill #0

## 2023-05-25 MED ORDER — AZITHROMYCIN 250 MG PO TABS
ORAL_TABLET | ORAL | 0 refills | Status: AC
  Filled 2023-05-25: qty 6, 5d supply, fill #0

## 2023-05-26 ENCOUNTER — Other Ambulatory Visit (HOSPITAL_COMMUNITY): Payer: Self-pay

## 2023-05-27 ENCOUNTER — Other Ambulatory Visit (HOSPITAL_COMMUNITY): Payer: Self-pay

## 2023-05-27 ENCOUNTER — Other Ambulatory Visit: Payer: Self-pay

## 2023-05-27 MED ORDER — TIRZEPATIDE 5 MG/0.5ML ~~LOC~~ SOAJ
5.0000 mg | SUBCUTANEOUS | 3 refills | Status: DC
  Filled 2023-05-27 (×2): qty 2, 28d supply, fill #0
  Filled 2023-06-21: qty 2, 28d supply, fill #1
  Filled 2023-07-17 (×3): qty 2, 28d supply, fill #2
  Filled 2023-08-14: qty 2, 28d supply, fill #3

## 2023-06-01 ENCOUNTER — Other Ambulatory Visit (HOSPITAL_COMMUNITY): Payer: Self-pay

## 2023-06-01 ENCOUNTER — Other Ambulatory Visit: Payer: Self-pay

## 2023-06-01 ENCOUNTER — Encounter: Payer: Self-pay | Admitting: Pharmacist

## 2023-06-01 MED ORDER — AMOXICILLIN-POT CLAVULANATE 875-125 MG PO TABS
1.0000 | ORAL_TABLET | Freq: Two times a day (BID) | ORAL | 0 refills | Status: DC
  Filled 2023-06-01: qty 20, 10d supply, fill #0

## 2023-06-16 DIAGNOSIS — G4733 Obstructive sleep apnea (adult) (pediatric): Secondary | ICD-10-CM | POA: Diagnosis not present

## 2023-06-18 ENCOUNTER — Encounter (HOSPITAL_BASED_OUTPATIENT_CLINIC_OR_DEPARTMENT_OTHER): Payer: Self-pay | Admitting: Orthopedic Surgery

## 2023-06-18 ENCOUNTER — Other Ambulatory Visit: Payer: Self-pay

## 2023-06-18 NOTE — Progress Notes (Signed)
   06/18/23 1634  Pre-op Phone Call  Surgery Date Verified 07/05/23  Arrival Time Verified 1130  Surgery Location Verified [facility]  Medical History Reviewed Yes  Is the patient taking a GLP-1 receptor agonist? Yes  Has the patient been informed on holding medication? (S)  Yes  Does the patient have diabetes? Pre-diabetes  Do you have a history of heart problems? No  Does patient have other implanted devices? No  Patient Teaching Enhanced Recovery;Pre / Post Procedure  Patient educated about smoking cessation 24 hours prior to surgery. N/A Non-Smoker  Patient verbalizes understanding of bowel prep? N/A  THA/TKA patients only:  By your surgery date, will you have been taking narcotics for 90 days or greater? No  Med Rec Completed Yes  Take the Following Meds the Morning of Surgery hold monjaro 7 day before surgery, Diamox,maxzide, vitamins, and asprin  Recent  Lab Work, EKG, CXR? No  NPO (Including gum & candy) After midnight  Allowed clear liquids Water;Gatorade  (diabetics please choose diet or no sugar options)  Patient instructed to stop clear liquids including Carb loading drink at: 1000  Stop Solids, Milk, Candy, and Gum STARTING AT MIDNIGHT  Did patient view EMMI videos? No  Responsible adult to drive and be with you for 24 hours? Yes  Name & Phone Number for Ride/Caregiver son  No Jewelry, money, nail polish or make-up.  No lotions, powders, perfumes. No shaving  48 hrs. prior to surgery. Yes  Contacts, Dentures & Glasses Will Have to be Removed Before OR. Yes  Please bring your ID and Insurance Card the morning of your surgery. (Surgery Centers Only) Yes  Bring any papers or x-rays with you that your surgeon gave you. Yes  Instructed to contact the location of procedure/ provider if they or anyone in their household develops symptoms or tests positive for COVID-19, has close contact with someone who tests positive for COVID, or has known exposure to any contagious illness. Yes   Call this number the morning of surgery  with any problems that may cancel your surgery. [phone]  Covid-19 Assessment  Have you had a positive COVID-19 test within the previous 90 days? No  COVID Testing Guidance Proceed with the additional questions.  Patient's surgery required a COVID-19 test (cardiothoracic, complex ENT, and bronchoscopies/ EBUS) No  Have you been unmasked and in close contact with anyone with COVID-19 or COVID-19 symptoms within the past 10 days? No  Do you or anyone in your household currently have any COVID-19 symptoms? No

## 2023-06-21 ENCOUNTER — Other Ambulatory Visit: Payer: Self-pay

## 2023-06-21 ENCOUNTER — Other Ambulatory Visit (HOSPITAL_COMMUNITY): Payer: Self-pay

## 2023-06-21 MED ORDER — TRIAMTERENE-HCTZ 75-50 MG PO TABS
1.0000 | ORAL_TABLET | Freq: Every day | ORAL | 3 refills | Status: DC
  Filled 2023-06-21: qty 90, 90d supply, fill #0
  Filled 2023-08-16 – 2023-10-04 (×2): qty 90, 90d supply, fill #1
  Filled 2023-12-04 – 2024-01-30 (×2): qty 90, 90d supply, fill #2
  Filled 2024-04-26: qty 90, 90d supply, fill #3

## 2023-06-22 ENCOUNTER — Other Ambulatory Visit (HOSPITAL_COMMUNITY): Payer: Self-pay

## 2023-06-22 ENCOUNTER — Other Ambulatory Visit: Payer: Self-pay

## 2023-06-22 ENCOUNTER — Encounter: Payer: Self-pay | Admitting: Pharmacist

## 2023-06-23 ENCOUNTER — Encounter (HOSPITAL_BASED_OUTPATIENT_CLINIC_OR_DEPARTMENT_OTHER)
Admission: RE | Admit: 2023-06-23 | Discharge: 2023-06-23 | Disposition: A | Discharge status: Home / Self Care | Payer: Commercial Managed Care - PPO | Source: Ambulatory Visit | Attending: Orthopedic Surgery | Admitting: Orthopedic Surgery

## 2023-06-23 ENCOUNTER — Other Ambulatory Visit (HOSPITAL_COMMUNITY): Payer: Self-pay

## 2023-06-23 ENCOUNTER — Ambulatory Visit: Payer: Commercial Managed Care - PPO

## 2023-06-23 DIAGNOSIS — Z01818 Encounter for other preprocedural examination: Secondary | ICD-10-CM | POA: Diagnosis present

## 2023-06-23 DIAGNOSIS — G5603 Carpal tunnel syndrome, bilateral upper limbs: Secondary | ICD-10-CM | POA: Diagnosis not present

## 2023-06-23 LAB — BASIC METABOLIC PANEL
Anion gap: 11 (ref 5–15)
BUN: 18 mg/dL (ref 6–20)
CO2: 22 mmol/L (ref 22–32)
Calcium: 9.2 mg/dL (ref 8.9–10.3)
Chloride: 105 mmol/L (ref 98–111)
Creatinine, Ser: 1 mg/dL (ref 0.44–1.00)
GFR, Estimated: >60 mL/min (ref >60)
Glucose, Bld: 97 mg/dL (ref 70–99)
Potassium: 2.9 mmol/L — ABNORMAL LOW (ref 3.5–5.1)
Sodium: 138 mmol/L (ref 135–145)

## 2023-06-23 LAB — SURGICAL PCR SCREEN
MRSA, PCR: NEGATIVE
Staphylococcus aureus: NEGATIVE

## 2023-06-23 NOTE — Progress Notes (Signed)
 ERAS drink given to patient with instructions for use.  Patient verbalized understanding of instructions.

## 2023-06-24 ENCOUNTER — Ambulatory Visit: Payer: Commercial Managed Care - PPO

## 2023-06-25 NOTE — Progress Notes (Signed)
K+ 2.9, will recheck BMET day of surgery per Dr.Hodierne.

## 2023-07-05 ENCOUNTER — Other Ambulatory Visit: Payer: Self-pay

## 2023-07-05 ENCOUNTER — Encounter (HOSPITAL_BASED_OUTPATIENT_CLINIC_OR_DEPARTMENT_OTHER)
Admission: RE | Disposition: A | Discharge status: Home / Self Care | Payer: Self-pay | Source: Home / Self Care | Attending: Orthopedic Surgery

## 2023-07-05 ENCOUNTER — Other Ambulatory Visit (HOSPITAL_COMMUNITY): Payer: Self-pay

## 2023-07-05 ENCOUNTER — Encounter (HOSPITAL_BASED_OUTPATIENT_CLINIC_OR_DEPARTMENT_OTHER): Payer: Self-pay | Admitting: Orthopedic Surgery

## 2023-07-05 ENCOUNTER — Ambulatory Visit (HOSPITAL_BASED_OUTPATIENT_CLINIC_OR_DEPARTMENT_OTHER)
Admission: RE | Admit: 2023-07-05 | Discharge: 2023-07-05 | Disposition: A | Discharge status: Home / Self Care | Payer: Commercial Managed Care - PPO | Attending: Orthopedic Surgery | Admitting: Orthopedic Surgery

## 2023-07-05 ENCOUNTER — Ambulatory Visit (HOSPITAL_BASED_OUTPATIENT_CLINIC_OR_DEPARTMENT_OTHER): Payer: Self-pay | Admitting: Anesthesiology

## 2023-07-05 ENCOUNTER — Ambulatory Visit (HOSPITAL_BASED_OUTPATIENT_CLINIC_OR_DEPARTMENT_OTHER): Payer: Commercial Managed Care - PPO | Admitting: Anesthesiology

## 2023-07-05 DIAGNOSIS — E66813 Obesity, class 3: Secondary | ICD-10-CM | POA: Insufficient documentation

## 2023-07-05 DIAGNOSIS — Z6841 Body Mass Index (BMI) 40.0 and over, adult: Secondary | ICD-10-CM | POA: Insufficient documentation

## 2023-07-05 DIAGNOSIS — K219 Gastro-esophageal reflux disease without esophagitis: Secondary | ICD-10-CM | POA: Insufficient documentation

## 2023-07-05 DIAGNOSIS — Z79899 Other long term (current) drug therapy: Secondary | ICD-10-CM | POA: Diagnosis not present

## 2023-07-05 DIAGNOSIS — I10 Essential (primary) hypertension: Secondary | ICD-10-CM | POA: Diagnosis not present

## 2023-07-05 DIAGNOSIS — G5601 Carpal tunnel syndrome, right upper limb: Secondary | ICD-10-CM

## 2023-07-05 DIAGNOSIS — G4733 Obstructive sleep apnea (adult) (pediatric): Secondary | ICD-10-CM | POA: Diagnosis not present

## 2023-07-05 DIAGNOSIS — Z01818 Encounter for other preprocedural examination: Secondary | ICD-10-CM

## 2023-07-05 DIAGNOSIS — R519 Headache, unspecified: Secondary | ICD-10-CM | POA: Insufficient documentation

## 2023-07-05 DIAGNOSIS — G4730 Sleep apnea, unspecified: Secondary | ICD-10-CM | POA: Diagnosis not present

## 2023-07-05 DIAGNOSIS — G932 Benign intracranial hypertension: Secondary | ICD-10-CM | POA: Diagnosis not present

## 2023-07-05 HISTORY — PX: CARPAL TUNNEL RELEASE: SHX101

## 2023-07-05 LAB — BASIC METABOLIC PANEL
Anion gap: 8 (ref 5–15)
BUN: 15 mg/dL (ref 6–20)
CO2: 23 mmol/L (ref 22–32)
Calcium: 8.8 mg/dL — ABNORMAL LOW (ref 8.9–10.3)
Chloride: 103 mmol/L (ref 98–111)
Creatinine, Ser: 0.69 mg/dL (ref 0.44–1.00)
GFR, Estimated: >60 mL/min (ref >60)
Glucose, Bld: 89 mg/dL (ref 70–99)
Potassium: 3.2 mmol/L — ABNORMAL LOW (ref 3.5–5.1)
Sodium: 134 mmol/L — ABNORMAL LOW (ref 135–145)

## 2023-07-05 LAB — GLUCOSE, CAPILLARY: Glucose-Capillary: 88 mg/dL (ref 70–99)

## 2023-07-05 SURGERY — CARPAL TUNNEL RELEASE
Anesthesia: General | Site: Hand | Laterality: Right

## 2023-07-05 MED ORDER — LIDOCAINE 2% (20 MG/ML) 5 ML SYRINGE
INTRAMUSCULAR | Status: DC | PRN
  Administered 2023-07-05: 100 mg via INTRAVENOUS

## 2023-07-05 MED ORDER — HYDROCODONE-ACETAMINOPHEN 5-325 MG PO TABS
ORAL_TABLET | ORAL | 0 refills | Status: DC
  Filled 2023-07-05: qty 15, 4d supply, fill #0

## 2023-07-05 MED ORDER — BUPIVACAINE HCL (PF) 0.25 % IJ SOLN
INTRAMUSCULAR | Status: DC | PRN
  Administered 2023-07-05: 9 mL

## 2023-07-05 MED ORDER — ACETAMINOPHEN 500 MG PO TABS
ORAL_TABLET | ORAL | Status: AC
  Filled 2023-07-05: qty 2

## 2023-07-05 MED ORDER — ONDANSETRON HCL 4 MG/2ML IJ SOLN: 4.0000 mg | Freq: Once | INTRAMUSCULAR | Status: DC | PRN

## 2023-07-05 MED ORDER — MIDAZOLAM HCL 2 MG/2ML IJ SOLN
INTRAMUSCULAR | Status: AC
  Filled 2023-07-05: qty 2

## 2023-07-05 MED ORDER — ONDANSETRON HCL 4 MG/2ML IJ SOLN
INTRAMUSCULAR | Status: DC | PRN
  Administered 2023-07-05: 4 mg via INTRAVENOUS

## 2023-07-05 MED ORDER — DEXAMETHASONE SODIUM PHOSPHATE 10 MG/ML IJ SOLN
INTRAMUSCULAR | Status: DC | PRN
  Administered 2023-07-05: 4 mg via INTRAVENOUS

## 2023-07-05 MED ORDER — SODIUM CHLORIDE 0.9 % IV SOLN: INTRAVENOUS | Status: DC | PRN

## 2023-07-05 MED ORDER — DEXAMETHASONE SODIUM PHOSPHATE 10 MG/ML IJ SOLN
INTRAMUSCULAR | Status: AC
  Filled 2023-07-05: qty 1

## 2023-07-05 MED ORDER — FENTANYL CITRATE (PF) 100 MCG/2ML IJ SOLN: 25.0000 ug | INTRAMUSCULAR | Status: DC | PRN

## 2023-07-05 MED ORDER — 0.9 % SODIUM CHLORIDE (POUR BTL) OPTIME
TOPICAL | Status: DC | PRN
  Administered 2023-07-05: 75 mL

## 2023-07-05 MED ORDER — ONDANSETRON HCL 4 MG/2ML IJ SOLN
INTRAMUSCULAR | Status: AC
  Filled 2023-07-05: qty 2

## 2023-07-05 MED ORDER — ACETAMINOPHEN 500 MG PO TABS
1000.0000 mg | ORAL_TABLET | Freq: Once | ORAL | Status: AC
  Administered 2023-07-05: 1000 mg via ORAL

## 2023-07-05 MED ORDER — BUPIVACAINE HCL (PF) 0.25 % IJ SOLN
INTRAMUSCULAR | Status: AC
  Filled 2023-07-05: qty 90

## 2023-07-05 MED ORDER — CEFAZOLIN SODIUM-DEXTROSE 2-4 GM/100ML-% IV SOLN
INTRAVENOUS | Status: AC
  Filled 2023-07-05: qty 100

## 2023-07-05 MED ORDER — FENTANYL CITRATE (PF) 100 MCG/2ML IJ SOLN
INTRAMUSCULAR | Status: DC | PRN
  Administered 2023-07-05 (×2): 50 ug via INTRAVENOUS

## 2023-07-05 MED ORDER — PROPOFOL 10 MG/ML IV BOLUS
INTRAVENOUS | Status: AC
  Filled 2023-07-05: qty 20

## 2023-07-05 MED ORDER — LIDOCAINE 2% (20 MG/ML) 5 ML SYRINGE
INTRAMUSCULAR | Status: AC
  Filled 2023-07-05: qty 5

## 2023-07-05 MED ORDER — CEFAZOLIN SODIUM-DEXTROSE 2-4 GM/100ML-% IV SOLN
2.0000 g | INTRAVENOUS | Status: AC
  Administered 2023-07-05: 2 g via INTRAVENOUS

## 2023-07-05 MED ORDER — PROPOFOL 10 MG/ML IV BOLUS
INTRAVENOUS | Status: DC | PRN
  Administered 2023-07-05: 150 mg via INTRAVENOUS

## 2023-07-05 MED ORDER — CEFAZOLIN SODIUM-DEXTROSE 2-4 GM/100ML-% IV SOLN: 2.0000 g | INTRAVENOUS | Status: DC

## 2023-07-05 MED ORDER — FENTANYL CITRATE (PF) 100 MCG/2ML IJ SOLN
INTRAMUSCULAR | Status: AC
  Filled 2023-07-05: qty 2

## 2023-07-05 MED ORDER — LACTATED RINGERS IV SOLN: INTRAVENOUS | Status: DC

## 2023-07-05 SURGICAL SUPPLY — 31 items
BLADE SURG 15 STRL LF DISP TIS (BLADE) ×2 IMPLANT
BNDG ELASTIC 3INX 5YD STR LF (GAUZE/BANDAGES/DRESSINGS) ×1 IMPLANT
BNDG ESMARK 4X9 LF (GAUZE/BANDAGES/DRESSINGS) IMPLANT
BNDG GAUZE DERMACEA FLUFF 4 (GAUZE/BANDAGES/DRESSINGS) ×1 IMPLANT
CHLORAPREP W/TINT 26 (MISCELLANEOUS) ×1 IMPLANT
CORD BIPOLAR FORCEPS 12FT (ELECTRODE) ×1 IMPLANT
COVER BACK TABLE 60X90IN (DRAPES) ×1 IMPLANT
COVER MAYO STAND STRL (DRAPES) ×1 IMPLANT
CUFF TOURN SGL QUICK 18X4 (TOURNIQUET CUFF) ×1 IMPLANT
DRAPE EXTREMITY T 121X128X90 (DISPOSABLE) ×1 IMPLANT
DRAPE SURG 17X23 STRL (DRAPES) ×1 IMPLANT
GAUZE PAD ABD 8X10 STRL (GAUZE/BANDAGES/DRESSINGS) ×1 IMPLANT
GAUZE SPONGE 4X4 12PLY STRL (GAUZE/BANDAGES/DRESSINGS) ×1 IMPLANT
GAUZE XEROFORM 1X8 LF (GAUZE/BANDAGES/DRESSINGS) ×1 IMPLANT
GLOVE BIO SURGEON STRL SZ7.5 (GLOVE) ×1 IMPLANT
GLOVE BIOGEL PI IND STRL 7.0 (GLOVE) IMPLANT
GLOVE BIOGEL PI IND STRL 8 (GLOVE) ×1 IMPLANT
GLOVE SURG SS PI 7.0 STRL IVOR (GLOVE) IMPLANT
GOWN STRL REUS W/ TWL LRG LVL3 (GOWN DISPOSABLE) ×1 IMPLANT
GOWN STRL REUS W/TWL XL LVL3 (GOWN DISPOSABLE) ×1 IMPLANT
NDL HYPO 25X1 1.5 SAFETY (NEEDLE) ×1 IMPLANT
NEEDLE HYPO 25X1 1.5 SAFETY (NEEDLE) ×1 IMPLANT
NS IRRIG 1000ML POUR BTL (IV SOLUTION) ×1 IMPLANT
PACK BASIN DAY SURGERY FS (CUSTOM PROCEDURE TRAY) ×1 IMPLANT
PADDING CAST ABS COTTON 4X4 ST (CAST SUPPLIES) ×1 IMPLANT
STOCKINETTE 4X48 STRL (DRAPES) ×1 IMPLANT
SUT ETHILON 4 0 PS 2 18 (SUTURE) ×1 IMPLANT
SYR BULB EAR ULCER 3OZ GRN STR (SYRINGE) ×1 IMPLANT
SYR CONTROL 10ML LL (SYRINGE) ×1 IMPLANT
TOWEL GREEN STERILE FF (TOWEL DISPOSABLE) ×2 IMPLANT
UNDERPAD 30X36 HEAVY ABSORB (UNDERPADS AND DIAPERS) ×1 IMPLANT

## 2023-07-05 NOTE — Anesthesia Preprocedure Evaluation (Addendum)
Anesthesia Evaluation  Patient identified by MRN, date of birth, ID band Patient awake    Reviewed: Allergy & Precautions, NPO status , Patient's Chart, lab work & pertinent test results, reviewed documented beta blocker date and time   Airway Mallampati: II  TM Distance: >3 FB Neck ROM: Full    Dental  (+) Teeth Intact, Dental Advisory Given, Implants,    Pulmonary sleep apnea and Continuous Positive Airway Pressure Ventilation    Pulmonary exam normal breath sounds clear to auscultation       Cardiovascular hypertension, Pt. on home beta blockers Normal cardiovascular exam Rhythm:Regular Rate:Normal     Neuro/Psych  Headaches Benign intracranial hypertension  negative psych ROS   GI/Hepatic Neg liver ROS,GERD  Medicated and Controlled,,  Endo/Other    Class 3 obesity  Renal/GU negative Renal ROS     Musculoskeletal  (+) Arthritis ,   RIGHT CARPAL TUNNEL SYNDROME   Abdominal   Peds  Hematology negative hematology ROS (+)   Anesthesia Other Findings Day of surgery medications reviewed with the patient.  Reproductive/Obstetrics                             Anesthesia Physical Anesthesia Plan  ASA: 3  Anesthesia Plan: General   Post-op Pain Management: Tylenol PO (pre-op)*   Induction: Intravenous  PONV Risk Score and Plan: 3 and Midazolam, Dexamethasone and Ondansetron  Airway Management Planned: LMA  Additional Equipment:   Intra-op Plan:   Post-operative Plan: Extubation in OR  Informed Consent: I have reviewed the patients History and Physical, chart, labs and discussed the procedure including the risks, benefits and alternatives for the proposed anesthesia with the patient or authorized representative who has indicated his/her understanding and acceptance.     Dental advisory given  Plan Discussed with: CRNA  Anesthesia Plan Comments:         Anesthesia Quick  Evaluation

## 2023-07-05 NOTE — Anesthesia Postprocedure Evaluation (Signed)
Anesthesia Post Note  Patient: [name]  Procedure(s) Performed: RIGHT CARPAL TUNNEL RELEASE (Right: Hand)     Patient location during evaluation: PACU Anesthesia Type: General Level of consciousness: awake and alert Pain management: pain level controlled Vital Signs Assessment: post-procedure vital signs reviewed and stable Respiratory status: spontaneous breathing, nonlabored ventilation and respiratory function stable Cardiovascular status: blood pressure returned to baseline and stable Postop Assessment: no apparent nausea or vomiting Anesthetic complications: no   No notable events documented.  Last Vitals:  Vitals:   07/05/23 1430 07/05/23 1445  BP: 131/74 119/71  Pulse: 68 66  Resp: 20 16  Temp:  (!) 36.1 C  SpO2: 96% 97%    Last Pain:  Vitals:   07/05/23 1445  TempSrc:   PainSc: 0-No pain                 Collene Schlichter

## 2023-07-05 NOTE — Discharge Instructions (Addendum)
[facility] Center Instructions Hand Surgery  Wound Care: Keep your hand elevated above the level of your heart.  Do not allow it to dangle by your side.  Keep the dressing dry and do not remove it unless your doctor advises you to do so.  He will usually change it at the time of your post-op visit.  Moving your fingers is advised to stimulate circulation but will depend on the site of your surgery.  If you have a splint applied, your doctor will advise you regarding movement.  Activity: Do not drive or operate machinery today.  Rest today and then you may return to your normal activity and work as indicated by your physician.  Diet:  Drink liquids today or eat a light diet.  You may resume a regular diet tomorrow.    General expectations: Pain for two to three days. Fingers may become slightly swollen.  Call your doctor if any of the following occur: Severe pain not relieved by pain medication. Elevated temperature. Dressing soaked with blood. Inability to move fingers. White or bluish color to fingers.   No Tylenol until after 5:30pm today, if needed.   Post Anesthesia Home Care Instructions  Activity: Get plenty of rest for the remainder of the day. A responsible individual must stay with you for 24 hours following the procedure.  For the next 24 hours, DO NOT: -Drive a car -Advertising copywriter -Drink alcoholic beverages -Take any medication unless instructed by your physician -Make any legal decisions or sign important papers.  Meals: Start with liquid foods such as gelatin or soup. Progress to regular foods as tolerated. Avoid greasy, spicy, heavy foods. If nausea and/or vomiting occur, drink only clear liquids until the nausea and/or vomiting subsides. Call your physician if vomiting continues.  Special Instructions/Symptoms: Your throat may feel dry or sore from the anesthesia or the breathing tube placed in your throat during surgery. If this causes discomfort, gargle with  warm salt water. The discomfort should disappear within 24 hours.  If you had a scopolamine patch placed behind your ear for the management of post- operative nausea and/or vomiting:  1. The medication in the patch is effective for 72 hours, after which it should be removed.  Wrap patch in a tissue and discard in the trash. Wash hands thoroughly with soap and water. 2. You may remove the patch earlier than 72 hours if you experience unpleasant side effects which may include dry mouth, dizziness or visual disturbances. 3. Avoid touching the patch. Wash your hands with soap and water after contact with the patch.

## 2023-07-05 NOTE — Op Note (Signed)
07/05/2023 [facility]                              OPERATIVE REPORT   PREOPERATIVE DIAGNOSIS:  Right carpal tunnel syndrome.  POSTOPERATIVE DIAGNOSIS:  Right carpal tunnel syndrome.  PROCEDURE:  Right carpal tunnel release.  SURGEON:  Betha Loa, MD  ASSISTANT:  none.  ANESTHESIA: General  IV FLUIDS:  Per anesthesia flow sheet.  ESTIMATED BLOOD LOSS:  Minimal.  COMPLICATIONS:  None.  SPECIMENS:  None.  TOURNIQUET TIME:    Total Tourniquet Time Documented: Forearm (Right) - 11 minutes Total: Forearm (Right) - 11 minutes   DISPOSITION:  Stable to PACU.  LOCATION: [facility]  INDICATIONS:  57 y.o. yo female with numbness and tingling right hand.  Positive nerve conduction studies. She wishes to proceed with right carpal tunnel release.  Risks, benefits and alternatives of surgery were discussed including the risk of blood loss; infection; damage to nerves, vessels, tendons, ligaments, bone; failure of surgery; need for additional surgery; complications with wound healing; continued pain; recurrence of carpal tunnel syndrome; and damage to motor branch. She voiced understanding of these risks and elected to proceed.   OPERATIVE COURSE:  After being identified preoperatively by myself, the patient and I agreed upon the procedure and site of procedure.  The surgical site was marked.  Surgical consent had been signed.  She was given IV Ancef as preoperative antibiotic prophylaxis.  She was transferred to the operating room and placed on the operating room table in supine position with the Right upper extremity on an armboard.  General anesthesia was induced by the anesthesiologist.  Right upper extremity was prepped and draped in normal sterile orthopaedic fashion.  A surgical pause was performed between the surgeons, anesthesia, and operating room staff, and all were in agreement as to the patient, procedure, and site of procedure.  Tourniquet at the  proximal aspect of the forearm was inflated to 250 mmHg after exsanguination of the arm with an Esmarch bandage  Incision was made over the transverse carpal ligament and carried into the subcutaneous tissues by spreading technique.  Bipolar electrocautery was used to obtain hemostasis.  The palmar fascia was sharply incised.  The transverse carpal ligament was identified.  The fascia distal to the ligament was opened.  Retractor was placed and the flexor tendons were identified.  The flexor tendon to the ring finger was identified and retracted radially.  The transverse carpal ligament was then incised from distal to proximal under direct visualization.  Scissors were used to split the distal aspect of the volar antebrachial fascia.  A finger was placed into the wound to ensure complete decompression, which was the case.  The nerve was examined.  It was adherent to the radial leaflet.  The motor branch was identified and was intact.  The wound was copiously irrigated with sterile saline.  It was then closed with 4-0 nylon in a horizontal mattress fashion.  It was injected with 0.25% plain Marcaine to aid in postoperative analgesia.  It was dressed with sterile Xeroform, 4x4s, an ABD, and wrapped with Kerlix and an Ace bandage.  Tourniquet was deflated at 11 minutes.  Fingertips were pink with brisk capillary refill after deflation of the tourniquet.  Operative drapes were broken down.  The patient was awoken from anesthesia safely.  She was transferred back to stretcher and taken to the PACU in stable condition.  I will  see her back in the office in 1 week for postoperative followup.  I will give her a prescription for Tramadol 50 mg 1-2 tabs PO q6 hours prn pain, dispense # 20.    Betha Loa, MD Electronically signed, 07/05/23

## 2023-07-05 NOTE — H&P (Signed)
[name] is an 57 y.o. female.   Chief Complaint: carpal tunnel syndrome HPI: 57 y.o. yo female with numbness and tingling bilateral hand.  Positive nerve conduction studies. She wishes to have right carpal tunnel release.   Allergies:  Allergies  Allergen Reactions   Benazepril-Hydrochlorothiazide Swelling    angioedema   Codeine Nausea And Vomiting   Hydrocodone Itching    Past Medical History:  Diagnosis Date   Allergy    takes Zyrtec daily   Arthritis    Back pain    arthritis   Benign intracranial hypertension    Chronic constipation    takes Stool Softener   Dizziness    Eczema    Family history of anesthesia complication    mother got confused some after anesthesia   Gastroesophageal reflux disease    takes Pepcid daily   Headache(784.0)    benign intercranial HTN d/t CSF leak, migraines   History of bronchitis    pt states a very long time ago   History of MRSA infection    > 67yrs ago   Hypertension    takes Lotensin daily   Lumbago    Obesity, unspecified    Peripheral edema    Pulsatile tinnitus    Sleep apnea    Unspecified vitamin D deficiency     Past Surgical History:  Procedure Laterality Date   ABDOMINAL HYSTERECTOMY     ANTERIOR AND POSTERIOR REPAIR N/A 10/10/2015   Procedure: ANTERIOR (CYSTOCELE) AND POSTERIOR REPAIR (RECTOCELE);  Surgeon: Alfredo Martinez, MD;  Location: [facility];  Service: Urology;  Laterality: N/A;   CARDIOVASCULAR STRESS TEST     CESAREAN SECTION  32yrs ago   CHOLECYSTECTOMY     CYSTO N/A 10/10/2015   Procedure: CYSTO;  Surgeon: Alfredo Martinez, MD;  Location: [facility];  Service: Urology;  Laterality: N/A;   GALLBLADDER SURGERY  51yrs ago   SINUS ENDO W/FUSION Bilateral 02/03/2013   Procedure: ENDOSCOPIC SINUS SURGERY WITH FUSION NAVIGATION;  Surgeon: Melvenia Beam, MD;  Location: [facility];  Service: ENT;  Laterality: Bilateral;  Repaired CSF Leak   US ECHOCARDIOGRAPHY     VAGINAL HYSTERECTOMY N/A 10/10/2015    Procedure: HYSTERECTOMY VAGINAL;  Surgeon: Maxie Better, MD;  Location: [facility];  Service: Gynecology;  Laterality: N/A;   VAGINAL PROLAPSE REPAIR N/A 10/10/2015   Procedure: VAULT PROLAPSE AND GRAFT;  Surgeon: Alfredo Martinez, MD;  Location: [facility];  Service: Urology;  Laterality: N/A;    Family History: Family History  Problem Relation Age of Onset   Cancer - Prostate Father    Hypertension Father    Hypertension Mother    Diabetes Mother    Heart attack Neg Hx    Hyperlipidemia Neg Hx    Sudden death Neg Hx     Social History:   reports that she has never smoked. She has never used smokeless tobacco. She reports that she does not drink alcohol and does not use drugs.  Medications: Medications Prior to Admission  Medication Sig Dispense Refill   acetaZOLAMIDE (DIAMOX) 250 MG tablet Take 1 tablet (250 mg total) by mouth in the morning AND 2 tablets (500 mg total) every evening. 270 tablet 3   betamethasone dipropionate 0.05 % cream Apply a small amount to affected area twice a day ,for stubborn areas. 45 g 2   cetirizine (ZYRTEC) 10 MG tablet Take 10 mg by mouth daily.     famotidine (PEPCID) 20 MG tablet Take 20 mg by mouth 2 (two)  times daily.      Fluocinolone Acetonide Scalp 0.01 % OIL Apply to scalp 3-4 times per week for seb derm as needed 118 mL 5   fluocinonide cream (LIDEX) 0.05 % Apply a small amount to the affected area of skin on back and left arm 2 (two) times daily as directed 30 g 2   fluticasone (FLONASE) 50 MCG/ACT nasal spray Place 2 sprays into both nostrils daily. 1 g 0   hydrOXYzine (ATARAX) 25 MG tablet Take 1 tablet (25 mg total) by mouth at bedtime as needed. 90 tablet 2   magnesium oxide (MAG-OX) 400 (240 Mg) MG tablet Take 1 tablet (400 mg total) by mouth 2 (two) times daily. 180 tablet 3   montelukast (SINGULAIR) 10 MG tablet Take 1 tablet (10 mg total) by mouth daily. 90 tablet 2   nebivolol (BYSTOLIC) 5 MG tablet Take 1 tablet (5 mg total) by mouth  daily. 90 tablet 3   pantoprazole (PROTONIX) 40 MG tablet Take 1 tablet (40 mg total) by mouth daily 20 minutes before breakfast. 90 tablet 2   Potassium Chloride ER 20 MEQ TBCR Take 1 tablet by mouth twice daily with food 180 tablet 3   rizatriptan (MAXALT) 10 MG tablet TAKE 1 TABLET BY MOUTH 3 TIMES DAILY AS NEEDED FOR MIGRAINE AS DIRECTED 10 tablet 5   sennosides-docusate sodium (SENOKOT-S) 8.6-50 MG tablet Take 2 tablets by mouth as needed.      silver sulfADIAZINE (SILVADENE) 1 % cream Apply a small amount 2 times weekly to affected areas in groin. 400 g 0   tirzepatide (MOUNJARO) 5 MG/0.5ML Pen Inject 5 mg into the skin once a week. 2 mL 3   triamcinolone cream (KENALOG) 0.1 % Apply topically to affected areas of psoriasis twice daily for 2 weeks, then daily for 1 week, then every other day. Restart when flaring 453 g 1   triamterene-hydrochlorothiazide (MAXZIDE) 75-50 MG tablet Take 1 tablet by mouth daily. (stop losartan) 90 tablet 3   venlafaxine XR (EFFEXOR-XR) 75 MG 24 hr capsule Take 1 capsule (75 mg total) by mouth daily. 90 capsule 3   Vitamin D, Ergocalciferol, (DRISDOL) 50000 units CAPS capsule Take 50,000 Units by mouth every 7 (seven) days. Every Sunday.     albuterol (VENTOLIN HFA) 108 (90 Base) MCG/ACT inhaler Inhale 2 puffs by mouth every 4 hours as needed for wheezing 6.7 g 3   Clobetasol Prop Emollient Base 0.05 % emollient cream Apply twice daily to thickest psoriasis lesions on body for 2 weeks, then daily for 1 weeks, then switch to triamcinolone. NEVER TO FACE, GROIN, or NORMAL SKIN 45 g 1   estradiol (ESTRACE VAGINAL) 0.1 MG/GM vaginal cream Insert 0.5 grams vaginally twice weekly as directed 60 g 11   fluconazole (DIFLUCAN) 150 MG tablet Take 1 tablet (150 mg total) by mouth as directed. Repeat in 3 days as directed 2 tablet 0   fluocinonide (LIDEX) 0.05 % external solution Apply a small amount to scalp twice daily as directed as needed for flares 60 mL 4   fluocinonide  (LIDEX) 0.05 % external solution Apply a small amount topically twice a day as needed for flares 60 mL 3   hydrocortisone 2.5 % cream Apply once to twice daily as needed to facial seborrheic dermatitis. Never use on smooth or normal skin. 28 g 2   ipratropium (ATROVENT) 0.06 % nasal spray Place 2 sprays into both nostrils 3 (three) times daily. 15 mL 2   nystatin-triamcinolone (MYCOLOG  II) cream Apply to affected area topically twice a day as needed for  7 days 30 g 1   nystatin-triamcinolone ointment (MYCOLOG) Apply to the affected area(s) of skin twice a day for 2 weeks then as needed 30 g 3   Semaglutide-Weight Management (WEGOVY) 0.5 MG/0.5ML SOAJ Inject 0.5 mg into the skin once a week. 2 mL 3    Results for orders placed or performed during the hospital encounter of 07/05/23 (from the past 48 hours)  Basic metabolic panel     Status: Abnormal   Collection Time: 07/05/23 11:23 AM  Result Value Ref Range   Sodium 134 (L) 135 - 145 mmol/L   Potassium 3.2 (L) 3.5 - 5.1 mmol/L   Chloride 103 98 - 111 mmol/L   CO2 23 22 - 32 mmol/L   Glucose, Bld 89 70 - 99 mg/dL    Comment: Glucose reference range applies only to samples taken after fasting for at least 8 hours.   BUN 15 6 - 20 mg/dL   Creatinine, Ser 4.09 0.44 - 1.00 mg/dL   Calcium 8.8 (L) 8.9 - 10.3 mg/dL   GFR, Estimated >81 >19 mL/min    Comment: (NOTE) Calculated using the CKD-EPI Creatinine Equation (2021)    Anion gap 8 5 - 15    Comment: Performed at [facility], [address], [place], [place] [address]    No results found.    Blood pressure 122/73, pulse 73, temperature (!) 97.2 F (36.2 C), temperature source Oral, resp. rate 18, height 5\' 3"  (1.6 m), weight 108.8 kg, last menstrual period 12/02/2014, SpO2 100%.  General appearance: alert, cooperative, and appears stated age Head: Normocephalic, without obvious abnormality, atraumatic Neck: supple, symmetrical, trachea midline Extremities: Intact sensation  and capillary refill all digits.  +epl/fpl/io.  No wounds.  Pulses: 2+ and symmetric Skin: Skin color, texture, turgor normal. No rashes or lesions Neurologic: Grossly normal Incision/Wound: none  Assessment/Plan Right carpal tunnel syndrome.  Non operative and operative treatment options have been discussed with the patient and patient wishes to proceed with operative treatment. Risks, benefits and alternatives of surgery were discussed including risks of blood loss, infection, damage to nerves/vessels/tendons/ligament/bone, failure of surgery, need for additional surgery, complication with wound healing, stiffness, recurrence, damage to motor branch.  She voiced understanding of these risks and elected to proceed.    Betha Loa 07/05/2023, 1:19 PM

## 2023-07-05 NOTE — Transfer of Care (Signed)
Immediate Anesthesia Transfer of Care Note  Patient: [name]  Procedure(s) Performed: RIGHT CARPAL TUNNEL RELEASE (Right: Hand)  Patient Location: PACU  Anesthesia Type:General  Level of Consciousness: awake, alert , and oriented  Airway & Oxygen Therapy: Patient Spontanous Breathing and Patient connected to face mask oxygen  Post-op Assessment: Report given to RN and Post -op Vital signs reviewed and stable  Post vital signs: Reviewed and stable  Last Vitals:  Vitals Value Taken Time  BP 139/79 07/05/23 1401  Temp 36.2 C 07/05/23 1401  Pulse 73 07/05/23 1405  Resp 23 07/05/23 1405  SpO2 98 % 07/05/23 1405  Vitals shown include unfiled device data.  Last Pain:  Vitals:   07/05/23 1134  TempSrc: Oral  PainSc: 0-No pain      Patients Stated Pain Goal: 6 (07/05/23 1134)  Complications: No notable events documented.

## 2023-07-05 NOTE — Anesthesia Procedure Notes (Signed)
Procedure Name: LMA Insertion Date/Time: 07/05/2023 1:29 PM  Performed by: Burna Cash, CRNAPre-anesthesia Checklist: Patient identified, Emergency Drugs available, Suction available and Patient being monitored Patient Re-evaluated:Patient Re-evaluated prior to induction Oxygen Delivery Method: Circle system utilized Preoxygenation: Pre-oxygenation with 100% oxygen Induction Type: IV induction Ventilation: Mask ventilation without difficulty LMA: LMA inserted LMA Size: 4.0 Number of attempts: 1 Airway Equipment and Method: Bite block Placement Confirmation: positive ETCO2 Tube secured with: Tape Dental Injury: Teeth and Oropharynx as per pre-operative assessment

## 2023-07-06 ENCOUNTER — Encounter (HOSPITAL_BASED_OUTPATIENT_CLINIC_OR_DEPARTMENT_OTHER): Payer: Self-pay | Admitting: Orthopedic Surgery

## 2023-07-17 ENCOUNTER — Other Ambulatory Visit (HOSPITAL_COMMUNITY): Payer: Self-pay

## 2023-07-17 DIAGNOSIS — G4733 Obstructive sleep apnea (adult) (pediatric): Secondary | ICD-10-CM | POA: Diagnosis not present

## 2023-07-21 ENCOUNTER — Ambulatory Visit
Admission: RE | Admit: 2023-07-21 | Discharge: 2023-07-21 | Disposition: A | Discharge status: Home / Self Care | Payer: Commercial Managed Care - PPO | Source: Ambulatory Visit | Attending: Internal Medicine | Admitting: Internal Medicine

## 2023-07-21 ENCOUNTER — Ambulatory Visit: Payer: Commercial Managed Care - PPO

## 2023-07-21 DIAGNOSIS — Z1231 Encounter for screening mammogram for malignant neoplasm of breast: Secondary | ICD-10-CM

## 2023-07-21 DIAGNOSIS — N816 Rectocele: Secondary | ICD-10-CM | POA: Diagnosis not present

## 2023-07-21 DIAGNOSIS — Z01419 Encounter for gynecological examination (general) (routine) without abnormal findings: Secondary | ICD-10-CM | POA: Diagnosis not present

## 2023-07-21 DIAGNOSIS — N8111 Cystocele, midline: Secondary | ICD-10-CM | POA: Diagnosis not present

## 2023-07-21 DIAGNOSIS — Z90710 Acquired absence of both cervix and uterus: Secondary | ICD-10-CM | POA: Diagnosis not present

## 2023-07-21 DIAGNOSIS — N952 Postmenopausal atrophic vaginitis: Secondary | ICD-10-CM | POA: Diagnosis not present

## 2023-07-23 ENCOUNTER — Other Ambulatory Visit (HOSPITAL_COMMUNITY): Payer: Self-pay

## 2023-07-26 ENCOUNTER — Other Ambulatory Visit (HOSPITAL_BASED_OUTPATIENT_CLINIC_OR_DEPARTMENT_OTHER): Payer: Self-pay

## 2023-07-26 ENCOUNTER — Other Ambulatory Visit (HOSPITAL_COMMUNITY): Payer: Self-pay

## 2023-07-27 ENCOUNTER — Other Ambulatory Visit (HOSPITAL_COMMUNITY): Payer: Self-pay

## 2023-07-27 MED ORDER — ESTRADIOL 0.1 MG/GM VA CREA
0.5000 g | TOPICAL_CREAM | VAGINAL | 8 refills | Status: AC
  Filled 2023-07-27: qty 42.5, 90d supply, fill #0

## 2023-08-09 ENCOUNTER — Ambulatory Visit: Payer: Commercial Managed Care - PPO | Admitting: Dermatology

## 2023-08-09 DIAGNOSIS — G90512 Complex regional pain syndrome I of left upper limb: Secondary | ICD-10-CM | POA: Diagnosis not present

## 2023-08-09 DIAGNOSIS — G5601 Carpal tunnel syndrome, right upper limb: Secondary | ICD-10-CM | POA: Diagnosis not present

## 2023-08-09 DIAGNOSIS — E782 Mixed hyperlipidemia: Secondary | ICD-10-CM | POA: Diagnosis not present

## 2023-08-09 DIAGNOSIS — R7303 Prediabetes: Secondary | ICD-10-CM | POA: Diagnosis not present

## 2023-08-09 DIAGNOSIS — K5730 Diverticulosis of large intestine without perforation or abscess without bleeding: Secondary | ICD-10-CM | POA: Diagnosis not present

## 2023-08-09 DIAGNOSIS — Z6839 Body mass index (BMI) 39.0-39.9, adult: Secondary | ICD-10-CM | POA: Diagnosis not present

## 2023-08-09 DIAGNOSIS — I119 Hypertensive heart disease without heart failure: Secondary | ICD-10-CM | POA: Diagnosis not present

## 2023-08-09 DIAGNOSIS — G932 Benign intracranial hypertension: Secondary | ICD-10-CM | POA: Diagnosis not present

## 2023-08-09 DIAGNOSIS — G4733 Obstructive sleep apnea (adult) (pediatric): Secondary | ICD-10-CM | POA: Diagnosis not present

## 2023-08-14 ENCOUNTER — Other Ambulatory Visit (HOSPITAL_COMMUNITY): Payer: Self-pay

## 2023-08-14 MED ORDER — NEBIVOLOL HCL 5 MG PO TABS
5.0000 mg | ORAL_TABLET | Freq: Every day | ORAL | 3 refills | Status: AC
  Filled 2023-08-14: qty 90, 90d supply, fill #0
  Filled 2023-12-04: qty 90, 90d supply, fill #1
  Filled 2024-01-30 – 2024-03-01 (×2): qty 90, 90d supply, fill #2
  Filled 2024-07-16: qty 90, 90d supply, fill #3

## 2023-08-15 ENCOUNTER — Other Ambulatory Visit: Payer: Self-pay

## 2023-08-16 ENCOUNTER — Other Ambulatory Visit (HOSPITAL_COMMUNITY): Payer: Self-pay

## 2023-08-17 ENCOUNTER — Other Ambulatory Visit: Payer: Self-pay

## 2023-08-17 ENCOUNTER — Other Ambulatory Visit (HOSPITAL_COMMUNITY): Payer: Self-pay

## 2023-08-17 ENCOUNTER — Other Ambulatory Visit (HOSPITAL_BASED_OUTPATIENT_CLINIC_OR_DEPARTMENT_OTHER): Payer: Self-pay

## 2023-08-17 MED ORDER — MAGNESIUM OXIDE -MG SUPPLEMENT 400 (240 MG) MG PO TABS
1.0000 | ORAL_TABLET | Freq: Two times a day (BID) | ORAL | 3 refills | Status: AC
  Filled 2023-08-17 – 2023-08-18 (×2): qty 180, 90d supply, fill #0
  Filled 2023-12-04: qty 180, 90d supply, fill #1
  Filled 2024-01-30: qty 180, 90d supply, fill #2

## 2023-08-17 MED ORDER — HYDROXYZINE HCL 25 MG PO TABS
25.0000 mg | ORAL_TABLET | Freq: Every evening | ORAL | 2 refills | Status: AC | PRN
  Filled 2023-08-17: qty 90, 90d supply, fill #0
  Filled 2024-01-30: qty 90, 90d supply, fill #1

## 2023-08-18 ENCOUNTER — Other Ambulatory Visit: Payer: Self-pay

## 2023-08-18 ENCOUNTER — Other Ambulatory Visit (HOSPITAL_BASED_OUTPATIENT_CLINIC_OR_DEPARTMENT_OTHER): Payer: Self-pay

## 2023-08-19 ENCOUNTER — Other Ambulatory Visit (HOSPITAL_COMMUNITY): Payer: Self-pay

## 2023-08-24 ENCOUNTER — Other Ambulatory Visit: Payer: Self-pay

## 2023-08-24 ENCOUNTER — Other Ambulatory Visit (HOSPITAL_COMMUNITY): Payer: Self-pay

## 2023-08-24 DIAGNOSIS — G5603 Carpal tunnel syndrome, bilateral upper limbs: Secondary | ICD-10-CM | POA: Diagnosis not present

## 2023-08-24 MED ORDER — VITAMIN D3 1.25 MG (50000 UT) PO CAPS
1.0000 | ORAL_CAPSULE | ORAL | 3 refills | Status: DC
  Filled 2023-08-24: qty 12, 84d supply, fill #0
  Filled 2023-10-29: qty 12, 84d supply, fill #1
  Filled 2023-12-04 – 2024-03-01 (×2): qty 12, 84d supply, fill #2
  Filled 2024-05-24: qty 12, 84d supply, fill #3

## 2023-08-31 DIAGNOSIS — G5603 Carpal tunnel syndrome, bilateral upper limbs: Secondary | ICD-10-CM | POA: Diagnosis not present

## 2023-09-03 ENCOUNTER — Other Ambulatory Visit (HOSPITAL_COMMUNITY): Payer: Self-pay

## 2023-09-03 DIAGNOSIS — G5601 Carpal tunnel syndrome, right upper limb: Secondary | ICD-10-CM | POA: Diagnosis not present

## 2023-09-03 MED ORDER — TIRZEPATIDE 5 MG/0.5ML ~~LOC~~ SOAJ
5.0000 mg | SUBCUTANEOUS | 3 refills | Status: DC
Start: 2023-09-03 — End: 2024-01-05
  Filled 2023-09-03 – 2023-09-07 (×2): qty 2, 28d supply, fill #0
  Filled 2023-10-04: qty 2, 28d supply, fill #1
  Filled 2023-10-29: qty 2, 28d supply, fill #2
  Filled 2023-12-04: qty 2, 28d supply, fill #3

## 2023-09-04 ENCOUNTER — Other Ambulatory Visit (HOSPITAL_COMMUNITY): Payer: Self-pay

## 2023-09-04 MED ORDER — AMOXICILLIN-POT CLAVULANATE 875-125 MG PO TABS
1.0000 | ORAL_TABLET | Freq: Two times a day (BID) | ORAL | 0 refills | Status: DC
  Filled 2023-09-04: qty 20, 10d supply, fill #0

## 2023-09-06 ENCOUNTER — Other Ambulatory Visit: Payer: Self-pay

## 2023-09-07 ENCOUNTER — Other Ambulatory Visit (HOSPITAL_COMMUNITY): Payer: Self-pay

## 2023-09-07 ENCOUNTER — Other Ambulatory Visit: Payer: Self-pay

## 2023-09-08 ENCOUNTER — Other Ambulatory Visit: Payer: Self-pay

## 2023-09-10 ENCOUNTER — Other Ambulatory Visit: Payer: Self-pay

## 2023-09-15 ENCOUNTER — Other Ambulatory Visit (HOSPITAL_COMMUNITY): Payer: Self-pay

## 2023-10-04 ENCOUNTER — Other Ambulatory Visit: Payer: Self-pay

## 2023-10-04 ENCOUNTER — Other Ambulatory Visit (HOSPITAL_COMMUNITY): Payer: Self-pay

## 2023-10-04 MED ORDER — MONTELUKAST SODIUM 10 MG PO TABS
10.0000 mg | ORAL_TABLET | Freq: Every day | ORAL | 2 refills | Status: AC
  Filled 2023-10-04: qty 90, 90d supply, fill #0
  Filled 2024-01-30: qty 90, 90d supply, fill #1
  Filled 2024-04-26: qty 90, 90d supply, fill #2

## 2023-10-04 MED ORDER — PANTOPRAZOLE SODIUM 40 MG PO TBEC
40.0000 mg | DELAYED_RELEASE_TABLET | Freq: Every day | ORAL | 1 refills | Status: AC
  Filled 2023-10-04: qty 90, 90d supply, fill #0
  Filled 2024-01-30: qty 90, 90d supply, fill #1

## 2023-10-05 ENCOUNTER — Other Ambulatory Visit: Payer: Self-pay

## 2023-10-18 ENCOUNTER — Other Ambulatory Visit: Payer: Self-pay

## 2023-10-18 ENCOUNTER — Other Ambulatory Visit (HOSPITAL_COMMUNITY): Payer: Self-pay

## 2023-10-29 ENCOUNTER — Other Ambulatory Visit (HOSPITAL_COMMUNITY): Payer: Self-pay

## 2023-11-05 DIAGNOSIS — G5601 Carpal tunnel syndrome, right upper limb: Secondary | ICD-10-CM | POA: Diagnosis not present

## 2023-11-05 DIAGNOSIS — I119 Hypertensive heart disease without heart failure: Secondary | ICD-10-CM | POA: Diagnosis not present

## 2023-11-05 DIAGNOSIS — G90512 Complex regional pain syndrome I of left upper limb: Secondary | ICD-10-CM | POA: Diagnosis not present

## 2023-11-05 DIAGNOSIS — E782 Mixed hyperlipidemia: Secondary | ICD-10-CM | POA: Diagnosis not present

## 2023-11-05 DIAGNOSIS — Z6838 Body mass index (BMI) 38.0-38.9, adult: Secondary | ICD-10-CM | POA: Diagnosis not present

## 2023-11-05 DIAGNOSIS — G4733 Obstructive sleep apnea (adult) (pediatric): Secondary | ICD-10-CM | POA: Diagnosis not present

## 2023-11-05 DIAGNOSIS — K5730 Diverticulosis of large intestine without perforation or abscess without bleeding: Secondary | ICD-10-CM | POA: Diagnosis not present

## 2023-11-05 DIAGNOSIS — G932 Benign intracranial hypertension: Secondary | ICD-10-CM | POA: Diagnosis not present

## 2023-11-05 DIAGNOSIS — R7303 Prediabetes: Secondary | ICD-10-CM | POA: Diagnosis not present

## 2023-11-05 DIAGNOSIS — Z6839 Body mass index (BMI) 39.0-39.9, adult: Secondary | ICD-10-CM | POA: Diagnosis not present

## 2023-11-05 DIAGNOSIS — G5602 Carpal tunnel syndrome, left upper limb: Secondary | ICD-10-CM | POA: Diagnosis not present

## 2023-12-04 ENCOUNTER — Other Ambulatory Visit (HOSPITAL_COMMUNITY): Payer: Self-pay

## 2023-12-06 ENCOUNTER — Other Ambulatory Visit: Payer: Self-pay

## 2024-01-05 ENCOUNTER — Other Ambulatory Visit (HOSPITAL_COMMUNITY): Payer: Self-pay

## 2024-01-05 MED ORDER — TIRZEPATIDE 5 MG/0.5ML ~~LOC~~ SOAJ
5.0000 mg | SUBCUTANEOUS | 3 refills | Status: DC
  Filled 2024-01-05: qty 2, 28d supply, fill #0
  Filled 2024-01-30: qty 2, 28d supply, fill #1
  Filled 2024-02-27 – 2024-03-07 (×2): qty 2, 28d supply, fill #2
  Filled 2024-04-03: qty 2, 28d supply, fill #3

## 2024-01-06 ENCOUNTER — Other Ambulatory Visit (HOSPITAL_COMMUNITY): Payer: Self-pay

## 2024-01-19 ENCOUNTER — Other Ambulatory Visit (HOSPITAL_COMMUNITY): Payer: Self-pay

## 2024-01-19 DIAGNOSIS — L408 Other psoriasis: Secondary | ICD-10-CM | POA: Diagnosis not present

## 2024-01-19 DIAGNOSIS — L409 Psoriasis, unspecified: Secondary | ICD-10-CM | POA: Diagnosis not present

## 2024-01-19 DIAGNOSIS — L650 Telogen effluvium: Secondary | ICD-10-CM | POA: Diagnosis not present

## 2024-01-19 DIAGNOSIS — L219 Seborrheic dermatitis, unspecified: Secondary | ICD-10-CM | POA: Diagnosis not present

## 2024-01-19 MED ORDER — FLUOCINOLONE ACETONIDE SCALP 0.01 % EX OIL
1.0000 | TOPICAL_OIL | CUTANEOUS | 11 refills | Status: DC | PRN
  Filled 2024-01-19: qty 118.28, 30d supply, fill #0

## 2024-01-20 ENCOUNTER — Other Ambulatory Visit: Payer: Self-pay

## 2024-01-31 ENCOUNTER — Other Ambulatory Visit: Payer: Self-pay

## 2024-01-31 ENCOUNTER — Other Ambulatory Visit (HOSPITAL_COMMUNITY): Payer: Self-pay

## 2024-02-01 ENCOUNTER — Ambulatory Visit: Payer: Commercial Managed Care - PPO | Admitting: Neurology

## 2024-02-01 ENCOUNTER — Other Ambulatory Visit: Payer: Self-pay

## 2024-02-01 ENCOUNTER — Other Ambulatory Visit (HOSPITAL_COMMUNITY): Payer: Self-pay

## 2024-02-01 ENCOUNTER — Encounter: Payer: Self-pay | Admitting: Neurology

## 2024-02-01 VITALS — BP 108/73 | HR 75 | Resp 17 | Ht 64.0 in | Wt 228.2 lb

## 2024-02-01 DIAGNOSIS — G4733 Obstructive sleep apnea (adult) (pediatric): Secondary | ICD-10-CM | POA: Diagnosis not present

## 2024-02-01 DIAGNOSIS — G43709 Chronic migraine without aura, not intractable, without status migrainosus: Secondary | ICD-10-CM | POA: Insufficient documentation

## 2024-02-01 DIAGNOSIS — G932 Benign intracranial hypertension: Secondary | ICD-10-CM

## 2024-02-01 MED ORDER — VENLAFAXINE HCL ER 75 MG PO CP24
75.0000 mg | ORAL_CAPSULE | Freq: Every day | ORAL | 3 refills | Status: AC
  Filled 2024-02-01 – 2024-04-03 (×2): qty 90, 90d supply, fill #0
  Filled 2024-07-16: qty 90, 90d supply, fill #1

## 2024-02-01 MED ORDER — RIZATRIPTAN BENZOATE 10 MG PO TABS
ORAL_TABLET | ORAL | 5 refills | Status: AC
  Filled 2024-02-01 (×3): qty 10, 30d supply, fill #0
  Filled 2024-07-16: qty 10, 30d supply, fill #1

## 2024-02-01 MED ORDER — ACETAZOLAMIDE 250 MG PO TABS
ORAL_TABLET | ORAL | 3 refills | Status: AC
  Filled 2024-02-01 (×3): qty 270, 90d supply, fill #0
  Filled 2024-05-24: qty 270, 90d supply, fill #1
  Filled 2024-06-13 – 2024-08-04 (×2): qty 270, 90d supply, fill #2

## 2024-02-01 NOTE — Progress Notes (Signed)
 PATIENT: [name] DOB: [birth year]  REASON FOR VISIT: follow up for migraines  HISTORY FROM: patient PRIMARY NEUROLOGIST: Willis/Athar  HISTORY OF PRESENT ILLNESS: Today 02/01/24  Has lost weight on Mounjaro . In total has lost about 30 lbs. Last time, we were going to do another HST, held off. She was afraid to come off the CPAP, husband still reports snoring at times. Her CPAP is from 2016. Download shows 77% compliance, greater than 4 hours 37%, set pressure 7 cmH2O, AHI 0.2, leak 23.4.  Works night shift as a Engineer, civil (consulting). Sometimes forgets to wear CPAP machine, helping her husband who is recovering from prostate cancer getting radiation. Uses FFM. With CPAP feels more rested, more energy, no brain fog. Migraines are better, watches diet. Stress and fatigue are triggers. On Diamox  250/500, Effexor  XR 75 mg daily. Maxalt  PRN, takes once a month. With feeling hot, overheated, may hear whooshing in her ears. Saw Dr. Cleotilde, eye doctor earlier this year no papilledema.   01/28/23 SS: Migraines doing well, 1-2 per month. Taking Diamox  lower dose 250/500, tolerating well. Occasionally have doesn't lift right leg up high enough, sometimes balance issue. Remains on Effexor  XR 75 mg daily. On Mounjaro  40 lbs weight loss. Takes Maxalt  as needed, with good benefit. 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 May miss work 1 day every 3 months due to loopy feeling, nauseated from migraine. Seeing eye doctor soon, Dr. Cleotilde. obstructive sleep apnea (overall mild, severe REM related), on treatment with CPAP.  CPAP card download 68/90 days at 76%, greater than 4 hours 25 days at 28%.  Average usage days used 3 hours 11 minutes.  Set pressure 7 cm water .  Leak 28.6.  AHI 0.2.  Feels benefit from CPAP, when she does not use will have morning headache.  Uses fullface mask.  Does wonder if her CPAP has changed with her 40 pound weight loss. Sleep patterns vary with working nightshift.   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 Tried to lower Diamox  from 500 mg BID, to 250 mg BID but felt off balance and fluid retention. Back on Diamox  500 mg BID. If she stands too quickly, can feel dizzy. Takes Effexor  XR 75 mg daily. About 2 migraines a month. Often stress induced. Has Maxalt  if needed, rarely takes. Is on Mounjaro  for weight loss, 10 lbs weight loss last 3 months. Is due for eye exam. Overall doing well. Didn't bring CPAP, we couldn't pull a download.  Update 12/30/21 SS: [name] is here today for follow-up.  Has not been seen since December 2021. Using CPAP, can't pull download, no data from April 2022, maybe not transmitting? Is RN, Working night shift again, had few more headaches then. Has lost 40 lbs over the last year. Feels migraines are better. On Saxenda . No more than 2 migraines a month. Maxalt  works well, rarely takes. On Effexor  XR 75 mg daily, Diamox  500 mg twice daily. Reports saw eye doctor recently, no papilledema. Got new glasses. Dr. Cleotilde. 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   Update 06/19/2020 SS: [name] is a 58 year old female with history of obesity, possible pseudotumor cerebri, and migraine headaches.  She is on Diamox  and Effexor .  She is on CPAP.  Headaches remain well controlled.  She may have no more than 2 migraines a month, sometimes none.  Usually brought on by stress.  For headache, Maxalt  is beneficial, but laying down and sleeping works the best.  She is a Engineer, civil (consulting), has FMLA, she may miss 1 or 2 days every 3 or so months.  Since 2014, has some occasional dizziness with standing, may feel she leans to the right, her balance is not as good.  Occasionally hears a swishing to her right ear, generally well controlled when her weight is maintained, she stays hydrated, and takes her medications.  She has yet to see her ophthalmologist.  Previously she has tried to wean off Diamox , and her headaches return.  CPAP is quite beneficial for headaches.  Presents today  for evaluation unaccompanied.  HISTORY 07/11/2019 SS: Ms. Radebaugh is a 58 year old female with history of obesity, possible pseudotumor cerebri.  She remains on Diamox  and Effexor .  She reported previously that her eye doctor has found that her papilledema had disappeared.  She reports she is due for a repeat eye visit in the next several months.  She indicates her headaches have been doing very well, this is the best she has felt in a long time.  She reports on average she may have 2 migraines a month.  She says she is not having to take Maxalt  as often.  She works as a Engineer, civil (consulting), she says she may have to miss 1 day of work every 3 months.  She finds Maxalt  to be beneficial, along with Zofran , but afterwards she feels drowsy and needs to lie down.  She remains on CPAP.  She presents today for evaluation via virtual visit.   REVIEW OF SYSTEMS: Out of a complete 14 system review of symptoms, the patient complains only of the following symptoms, and all other reviewed systems are negative.  See HPI  ALLERGIES: Allergies  Allergen Reactions   Benazepril -Hydrochlorothiazide  Swelling    angioedema   Codeine Nausea And Vomiting   Hydrocodone  Itching   HOME MEDICATIONS: Outpatient Medications Prior to Visit  Medication Sig Dispense Refill   acetaZOLAMIDE  (DIAMOX ) 250 MG tablet Take 1 tablet (250 mg total) by mouth in the morning AND 2 tablets (500 mg total) every evening. 270 tablet 3   albuterol  (VENTOLIN  HFA) 108 (90 Base) MCG/ACT inhaler Inhale 2 puffs by mouth every 4 hours as needed for wheezing 6.7 g 3   betamethasone  dipropionate 0.05 % cream Apply a small amount to affected area twice a day ,for stubborn areas. 45 g 2   cetirizine (ZYRTEC) 10 MG tablet Take 10 mg by mouth daily.     Cholecalciferol  (VITAMIN D3) 1.25 MG (50000 UT) CAPS TAKE 1 CAPSULE BY MOUTH ONCE WEEKLY 12 capsule 3   estradiol  (ESTRACE  VAGINAL) 0.1 MG/GM vaginal cream Place 0.5 g vaginally 2 (two) times a week. 42.5 g 8    famotidine  (PEPCID ) 20 MG tablet Take 20 mg by mouth 2 (two) times daily.      Fluocinolone  Acetonide Scalp 0.01 % OIL Apply to scalp 3-4 times per week for seb derm as needed 118 mL 5   fluocinonide  (LIDEX ) 0.05 % external solution Apply a small amount topically twice a day as needed for flares 60 mL 3   fluticasone  (FLONASE ) 50 MCG/ACT nasal spray Place 2 sprays into both nostrils daily. 1 g 0   hydrocortisone  2.5 % cream Apply once to twice daily as needed to facial seborrheic dermatitis. Never use on smooth or normal skin. 28 g 2   hydrOXYzine  (ATARAX ) 25 MG tablet Take 1 tablet (25 mg total) by mouth at bedtime as needed. 90 tablet 2   ipratropium (ATROVENT ) 0.06 % nasal spray Place 2 sprays  into both nostrils 3 (three) times daily. 15 mL 2   magnesium  oxide (MAG-OX) 400 (240 Mg) MG tablet Take 1 tablet (400 mg total) by mouth 2 (two) times daily. 180 tablet 3   montelukast  (SINGULAIR ) 10 MG tablet Take 1 tablet (10 mg total) by mouth daily. 90 tablet 2   nebivolol  (BYSTOLIC ) 5 MG tablet Take 1 tablet (5 mg total) by mouth daily. 90 tablet 3   pantoprazole  (PROTONIX ) 40 MG tablet Take 1 tablet (40 mg total) by mouth daily. 90 tablet 1   Potassium Chloride  ER 20 MEQ TBCR Take 1 tablet by mouth twice daily with food 180 tablet 3   rizatriptan  (MAXALT ) 10 MG tablet TAKE 1 TABLET BY MOUTH 3 TIMES DAILY AS NEEDED FOR MIGRAINE AS DIRECTED 10 tablet 5   sennosides-docusate sodium  (SENOKOT-S) 8.6-50 MG tablet Take 2 tablets by mouth as needed.      silver  sulfADIAZINE  (SILVADENE ) 1 % cream Apply a small amount 2 times weekly to affected areas in groin. 400 g 0   tirzepatide  (MOUNJARO ) 5 MG/0.5ML Pen Inject 5 mg into the skin once a week. 2 mL 3   triamcinolone  cream (KENALOG ) 0.1 % Apply topically to affected areas of psoriasis twice daily for 2 weeks, then daily for 1 week, then every other day. Restart when flaring 453 g 1   triamterene -hydrochlorothiazide  (MAXZIDE ) 75-50 MG tablet Take 1 tablet by  mouth daily. (stop losartan) 90 tablet 3   venlafaxine  XR (EFFEXOR -XR) 75 MG 24 hr capsule Take 1 capsule (75 mg total) by mouth daily. 90 capsule 3   amoxicillin -clavulanate (AUGMENTIN ) 875-125 MG tablet Take 1 tablet by mouth 2 times daily. 20 tablet 0   Clobetasol  Prop Emollient Base 0.05 % emollient cream Apply twice daily to thickest psoriasis lesions on body for 2 weeks, then daily for 1 weeks, then switch to triamcinolone . NEVER TO FACE, GROIN, or NORMAL SKIN 45 g 1   fluconazole  (DIFLUCAN ) 150 MG tablet Take 1 tablet (150 mg total) by mouth as directed. Repeat in 3 days as directed 2 tablet 0   Fluocinolone  Acetonide Scalp 0.01 % OIL Apply 1 application  to the scalp 3-4 times per week as needed for seb derm. 118 mL 11   fluocinonide  (LIDEX ) 0.05 % external solution Apply a small amount to scalp twice daily as directed as needed for flares 60 mL 4   fluocinonide  cream (LIDEX ) 0.05 % Apply a small amount to the affected area of skin on back and left arm 2 (two) times daily as directed 30 g 2   HYDROcodone -acetaminophen  (NORCO/VICODIN) 5-325 MG tablet Take 1-2 tablets by mouth every 6 hours as needed for pain. 15 tablet 0   nystatin -triamcinolone  (MYCOLOG II) cream Apply to affected area topically twice a day as needed for  7 days 30 g 1   nystatin -triamcinolone  ointment (MYCOLOG) Apply to the affected area(s) of skin twice a day for 2 weeks then as needed 30 g 3   Semaglutide -Weight Management (WEGOVY ) 0.5 MG/0.5ML SOAJ Inject 0.5 mg into the skin once a week. 2 mL 3   Vitamin D, Ergocalciferol, (DRISDOL) 50000 units CAPS capsule Take 50,000 Units by mouth every 7 (seven) days. Every Sunday.     No facility-administered medications prior to visit.    PAST MEDICAL HISTORY: Past Medical History:  Diagnosis Date   Allergy    takes Zyrtec daily   Arthritis    Back pain    arthritis   Benign intracranial hypertension  Chronic constipation    takes Stool Softener   Dizziness    Eczema     Family history of anesthesia complication    mother got confused some after anesthesia   Gastroesophageal reflux disease    takes Pepcid  daily   Headache(784.0)    benign intercranial HTN d/t CSF leak, migraines   History of bronchitis    pt states a very long time ago   History of MRSA infection    > 71yrs ago   Hypertension    takes Lotensin  daily   Lumbago    Obesity, unspecified    Peripheral edema    Pulsatile tinnitus    Sleep apnea    Unspecified vitamin D deficiency     PAST SURGICAL HISTORY: Past Surgical History:  Procedure Laterality Date   ABDOMINAL HYSTERECTOMY     ANTERIOR AND POSTERIOR REPAIR N/A 10/10/2015   Procedure: ANTERIOR (CYSTOCELE) AND POSTERIOR REPAIR (RECTOCELE);  Surgeon: Glendia Elizabeth, MD;  Location: [facility];  Service: Urology;  Laterality: N/A;   CARDIOVASCULAR STRESS TEST     CARPAL TUNNEL RELEASE Right 07/05/2023   Procedure: RIGHT CARPAL TUNNEL RELEASE;  Surgeon: Murrell Drivers, MD;  Location: [facility];  Service: Orthopedics;  Laterality: Right;  30 MIN   CESAREAN SECTION  33yrs ago   CHOLECYSTECTOMY     CYSTO N/A 10/10/2015   Procedure: CYSTO;  Surgeon: Glendia Elizabeth, MD;  Location: [facility];  Service: Urology;  Laterality: N/A;   GALLBLADDER SURGERY  46yrs ago   SINUS ENDO W/FUSION Bilateral 02/03/2013   Procedure: ENDOSCOPIC SINUS SURGERY WITH FUSION NAVIGATION;  Surgeon: Merilee Kraft, MD;  Location: [facility];  Service: ENT;  Laterality: Bilateral;  Repaired CSF Leak   US  ECHOCARDIOGRAPHY     VAGINAL HYSTERECTOMY N/A 10/10/2015   Procedure: HYSTERECTOMY VAGINAL;  Surgeon: Dickie Carder, MD;  Location: [facility];  Service: Gynecology;  Laterality: N/A;   VAGINAL PROLAPSE REPAIR N/A 10/10/2015   Procedure: VAULT PROLAPSE AND GRAFT;  Surgeon: Glendia Elizabeth, MD;  Location: [facility];  Service: Urology;  Laterality: N/A;    FAMILY HISTORY: Family History  Problem Relation Age of Onset   Hypertension Mother    Diabetes  Mother    Hypertension Father    Prostate cancer Father    Breast cancer Maternal Aunt 51   Ovarian cancer Niece 43   Heart attack Neg Hx    Hyperlipidemia Neg Hx    Sudden death Neg Hx    BRCA 1/2 Neg Hx     SOCIAL HISTORY: Social History   Socioeconomic History   Marital status: Married    Spouse name: Koren   Number of children: 2   Years of education: 12+   Highest education level: Bachelor's degree (e.g., BA, AB, BS)  Occupational History    Employer: [employer]  Tobacco Use   Smoking status: Never   Smokeless tobacco: Never  Vaping Use   Vaping status: Never Used  Substance and Sexual Activity   Alcohol use: No    Alcohol/week: 0.0 standard drinks of alcohol   Drug use: No   Sexual activity: Not Currently  Other Topics Concern   Not on file  Social History Narrative   Patient lives at home with husband and son. [place]on)   Patient has 2 children.    Patient is currently working as an Charity fundraiser.   Patient has a college education.    Patient is right handed.    Consumes drinks 1 16oz soda 3 times  a week, rarely drinks coffee.    Works night shift   Social Drivers of Corporate investment banker Strain: Not on file  Food Insecurity: Low Risk  (08/17/2023)   Received from [facility]   Hunger Vital Sign    Within the past 12 months, you worried that your food would run out before you got money to buy more: Never true    Within the past 12 months, the food you bought just didn't last and you didn't have money to get more. : Never true  Transportation Needs: No Transportation Needs (08/17/2023)   Received from [facility]    In the past 12 months, has lack of reliable transportation kept you from medical appointments, meetings, work or from getting things needed for daily living? : No  Physical Activity: Not on file  Stress: Not on file  Social Connections: Unknown (11/10/2021)   Received from [facility]   Social Network    Social Network: Not on  file  Intimate Partner Violence: Unknown (11/10/2021)   Received from [facility]   HITS    Physically Hurt: Not on file    Insult or Talk Down To: Not on file    Threaten Physical Harm: Not on file    Scream or Curse: Not on file    PHYSICAL EXAM  Vitals:   02/01/24 1421  BP: 108/73  Pulse: 75  Resp: 17  SpO2: 94%  Weight: 228 lb 3.2 oz (103.5 kg)  Height: 5' 4 (1.626 m)   Body mass index is 39.17 kg/m.  Generalized: Well developed, in no acute distress   Neurological examination  Mentation: Alert oriented to time, place, history taking. Follows all commands speech and language fluent Cranial nerve II-XII: Pupils were equal round reactive to light. Extraocular movements were full, visual field were full on confrontational test. Facial sensation and strength were normal.  Head turning and shoulder shrug  were normal and symmetric. Motor: The motor testing reveals 5 over 5 strength of all 4 extremities. Good symmetric motor tone is noted throughout.  Sensory: Sensory testing is intact to soft touch on all 4 extremities. No evidence of extinction is noted.  Coordination: Cerebellar testing reveals good finger-nose-finger and heel-to-shin bilaterally.  Gait and station: Gait is normal.   Reflexes: Deep tendon reflexes are symmetric and normal bilaterally.   DIAGNOSTIC DATA (LABS, IMAGING, TESTING) - I reviewed patient records, labs, notes, testing and imaging myself where available.  Lab Results  Component Value Date   WBC 10.4 10/11/2015   HGB 12.3 10/11/2015   HCT 37.8 10/11/2015   MCV 81.8 10/11/2015   PLT 369 10/11/2015      Component Value Date/Time   NA 134 (L) 07/05/2023 1123   NA 140 12/10/2011 0000   K 3.2 (L) 07/05/2023 1123   CL 103 07/05/2023 1123   CO2 23 07/05/2023 1123   GLUCOSE 89 07/05/2023 1123   BUN 15 07/05/2023 1123   BUN 11 12/10/2011 0000   CREATININE 0.69 07/05/2023 1123   CALCIUM  8.8 (L) 07/05/2023 1123   PROT 6.4 02/05/2013 0502    ALBUMIN 3.1 (L) 02/05/2013 0502   AST 20 02/05/2013 0502   ALT 12 02/05/2013 0502   ALKPHOS 77 02/05/2013 0502   BILITOT 0.4 02/05/2013 0502   GFRNONAA >60 07/05/2023 1123   GFRAA >60 10/11/2015 0530   No results found for: CHOL, HDL, LDLCALC, LDLDIRECT, TRIG, CHOLHDL No results found for: YHAJ8R No results found for: VITAMINB12  No results found for: TSH  ASSESSMENT AND PLAN 58 y.o. year old female   1.  Pseudotumor cerebri 2.  Migraine headache -Doing very good, reports recent ophthalmology evaluation revealed no papilledema -Continue Diamox  250/500 mg daily, Effexor  XR 75 mg daily -Continue Maxalt  10 mg as needed for acute headache -Continue annual ophthalmology evaluation -Has done excellent to lose 30 pounds  3. OSA on CPAP -Order home sleep study for reevaluation of sleep apnea with 30 pound weight loss. Also, her CPAP machine is from 2016 -Her recent download is slightly suboptimal due to varying sleep patterns as she works nightshift -Sleep study was in 2016 obstructive sleep apnea (overall mild, severe REM related), on treatment with CPAP. - She has had significant benefit from CPAP, more energy, cognition is sharper   Follow-up in 1 year.  Lauraine Born, AGNP-C, DNP 02/01/2024, 2:29 PM [facility] [address] [place], [place] [address] [phone]

## 2024-02-01 NOTE — Patient Instructions (Signed)
 Great to see you today! Order home sleep study to reevaluate sleep apnea Continue current medications for migraine management Continue annual ophthalmology evaluation Follow-up in 1 year or sooner if needed.  Thanks!!

## 2024-02-04 DIAGNOSIS — G932 Benign intracranial hypertension: Secondary | ICD-10-CM | POA: Diagnosis not present

## 2024-02-04 DIAGNOSIS — K5730 Diverticulosis of large intestine without perforation or abscess without bleeding: Secondary | ICD-10-CM | POA: Diagnosis not present

## 2024-02-04 DIAGNOSIS — E782 Mixed hyperlipidemia: Secondary | ICD-10-CM | POA: Diagnosis not present

## 2024-02-04 DIAGNOSIS — G5602 Carpal tunnel syndrome, left upper limb: Secondary | ICD-10-CM | POA: Diagnosis not present

## 2024-02-04 DIAGNOSIS — G4733 Obstructive sleep apnea (adult) (pediatric): Secondary | ICD-10-CM | POA: Diagnosis not present

## 2024-02-04 DIAGNOSIS — R7303 Prediabetes: Secondary | ICD-10-CM | POA: Diagnosis not present

## 2024-02-04 DIAGNOSIS — G90512 Complex regional pain syndrome I of left upper limb: Secondary | ICD-10-CM | POA: Diagnosis not present

## 2024-02-04 DIAGNOSIS — I119 Hypertensive heart disease without heart failure: Secondary | ICD-10-CM | POA: Diagnosis not present

## 2024-02-04 DIAGNOSIS — Z6835 Body mass index (BMI) 35.0-35.9, adult: Secondary | ICD-10-CM | POA: Diagnosis not present

## 2024-02-28 ENCOUNTER — Other Ambulatory Visit (HOSPITAL_COMMUNITY): Payer: Self-pay

## 2024-03-01 ENCOUNTER — Other Ambulatory Visit (HOSPITAL_COMMUNITY): Payer: Self-pay

## 2024-03-02 ENCOUNTER — Other Ambulatory Visit: Payer: Self-pay

## 2024-03-03 ENCOUNTER — Ambulatory Visit (INDEPENDENT_AMBULATORY_CARE_PROVIDER_SITE_OTHER): Admitting: Neurology

## 2024-03-03 DIAGNOSIS — G4733 Obstructive sleep apnea (adult) (pediatric): Secondary | ICD-10-CM

## 2024-03-03 DIAGNOSIS — R0683 Snoring: Secondary | ICD-10-CM

## 2024-03-07 ENCOUNTER — Other Ambulatory Visit (HOSPITAL_COMMUNITY): Payer: Self-pay

## 2024-03-07 ENCOUNTER — Other Ambulatory Visit: Payer: Self-pay

## 2024-03-07 MED ORDER — AMOXICILLIN-POT CLAVULANATE 875-125 MG PO TABS
1.0000 | ORAL_TABLET | Freq: Two times a day (BID) | ORAL | 0 refills | Status: AC
  Filled 2024-03-07: qty 20, 10d supply, fill #0

## 2024-04-03 ENCOUNTER — Other Ambulatory Visit (HOSPITAL_COMMUNITY): Payer: Self-pay

## 2024-04-03 ENCOUNTER — Other Ambulatory Visit: Payer: Self-pay

## 2024-04-05 DIAGNOSIS — G5602 Carpal tunnel syndrome, left upper limb: Secondary | ICD-10-CM | POA: Diagnosis not present

## 2024-04-06 ENCOUNTER — Other Ambulatory Visit: Payer: Self-pay | Admitting: Orthopedic Surgery

## 2024-04-12 NOTE — Progress Notes (Unsigned)
 SABRA

## 2024-04-14 NOTE — Procedures (Signed)
 [facility]  HOME SLEEP TEST (SANSA) REPORT (Mail-Out Device):   STUDY DATE: 04/04/2028  DOB: Feb 10, 1966  MRN: [id number]  ORDERING CLINICIAN: True Mar, MD, PhD   REFERRING CLINICIAN: Lauraine Born, NP  CLINICAL INFORMATION/HISTORY (obtained from visit note dated 02/01/2024): 58 year old female with an underlying medical history of migraine headaches, obesity, sleep apnea on CPAP therapy, allergies, eczema, arthritis, back pain, chronic constipation, reflux disease, hypertension, pulsatile tinnitus, edema, and vitamin D deficiency, who presents for reevaluation of her obstructive sleep apnea.  She was diagnosed several years ago and has been on CPAP therapy at a pressure of 7 cm with good apnea control.  She has lost weight over time.  She presents for reevaluation.  BMI (at the time of sleep clinic visit and/or test date): 39.2 kg/m  FINDINGS:   Study Protocol:    The SANSA single-point-of-skin-contact chest-worn sensor - an FDA cleared and DOT approved type 4 home sleep test device - measures eight physiological channels,  including blood oxygen saturation (measured via PPG [photoplethysmography]), EKG-derived heart rate, respiratory effort, chest movement (measured via accelerometer), snoring, body position, and actigraphy. The device is designed to be worn for up to 10 hours per study.   Sleep Summary:   Total Recording Time (hours, min): 9 hours, 53 min  Total Effective Sleep Time (hours, min):  6 hours, 44 min  Sleep Efficiency (%):    68%   Respiratory Indices:   Calculated sAHI (per hour):  4.7/hour         Oxygen Saturation Statistics:    Oxygen Saturation (%) Mean: 94.7%   Minimum oxygen saturation (%):                 87.1%   O2 Saturation Range (%): 87.1-100%   Time below or at 88% saturation: 0 min   Pulse Rate Statistics:   Pulse Mean (bpm):    63/min    Pulse Range (55-111/min)   Snoring: Mild  IMPRESSION/DIAGNOSES:     Primary snoring    RECOMMENDATIONS:   This home sleep test does not demonstrate any significant obstructive sleep disordered breathing with a total AHI of less than 5/hour.  Her total AHI was 4.7/h, O2 nadir briefly 87% without any significant time below or at 88% saturation for the night, less than 1 minute.  Snoring was in the mild range.  The patient has been on CPAP therapy and may be eligible for a new machine.  However, based on this study, treatment with a positive airway pressure device is no longer necessary.  I would also recommend verifying that the patient did not use her PAP machine during this home sleep test as that would result in a false negative most likely.   Further weight loss may reduce her snoring and borderline residual sleep apnea.  If the patient still has sleep-related symptoms without PAP therapy and if clinical suspicion persists for underlying obstructive sleep disordered breathing, especially since her AHI was close to 5/h, consideration should be given to request a laboratory attended sleep study to verify if she truly no longer has obstructive sleep apnea.  Of note, sleep appeared to be quite disrupted during this test. For disturbing snoring, an oral appliance through dentistry or orthodontics can be considered.  Other causes of the patient's symptoms, including circadian rhythm disturbances, an underlying mood disorder, medication effect and/or an underlying medical problem cannot be ruled out based on this test. Clinical correlation is recommended.  The patient should be cautioned not  to drive, work at heights, or operate dangerous or heavy equipment when tired or sleepy. Review and reiteration of good sleep hygiene measures should be pursued with any patient. The patient will be advised to follow up with her referring provider, who will be notified of the test results.   I certify that I have reviewed the raw data recording prior to the issuance of this report in  accordance with the standards of the American Academy of Sleep Medicine (AASM).    INTERPRETING PHYSICIAN:   True Mar, MD, PhD Medical Director, [facility]) Diplomat, ABPN (Neurology and Sleep)   [facility] [address] [place], [place] [address] [phone]

## 2024-04-17 ENCOUNTER — Ambulatory Visit: Payer: Self-pay | Admitting: Neurology

## 2024-04-26 ENCOUNTER — Other Ambulatory Visit: Payer: Self-pay

## 2024-04-26 ENCOUNTER — Other Ambulatory Visit (HOSPITAL_COMMUNITY): Payer: Self-pay

## 2024-04-26 MED ORDER — TIRZEPATIDE 5 MG/0.5ML ~~LOC~~ SOAJ
5.0000 mg | SUBCUTANEOUS | 3 refills | Status: AC
  Filled 2024-04-26: qty 2, 28d supply, fill #0
  Filled 2024-05-24: qty 2, 28d supply, fill #1
  Filled 2024-06-13 – 2024-06-16 (×2): qty 2, 28d supply, fill #2
  Filled 2024-07-16: qty 2, 28d supply, fill #3

## 2024-04-26 MED ORDER — POTASSIUM CHLORIDE ER 20 MEQ PO TBCR
20.0000 meq | EXTENDED_RELEASE_TABLET | Freq: Two times a day (BID) | ORAL | 0 refills | Status: DC
  Filled 2024-04-26: qty 180, 90d supply, fill #0

## 2024-05-24 ENCOUNTER — Other Ambulatory Visit (HOSPITAL_COMMUNITY): Payer: Self-pay

## 2024-05-25 ENCOUNTER — Other Ambulatory Visit: Payer: Self-pay

## 2024-06-13 ENCOUNTER — Other Ambulatory Visit (HOSPITAL_COMMUNITY): Payer: Self-pay

## 2024-06-13 ENCOUNTER — Other Ambulatory Visit: Payer: Self-pay

## 2024-06-13 ENCOUNTER — Encounter: Payer: Self-pay | Admitting: Pharmacist

## 2024-06-13 MED ORDER — AZITHROMYCIN 250 MG PO TABS
ORAL_TABLET | ORAL | 0 refills | Status: AC
  Filled 2024-06-13: qty 6, 5d supply, fill #0

## 2024-06-14 ENCOUNTER — Other Ambulatory Visit: Payer: Self-pay

## 2024-06-14 ENCOUNTER — Other Ambulatory Visit (HOSPITAL_COMMUNITY): Payer: Self-pay

## 2024-06-14 MED ORDER — VITAMIN D3 1.25 MG (50000 UT) PO CAPS
1.0000 | ORAL_CAPSULE | ORAL | 3 refills | Status: AC
  Filled 2024-06-14 – 2024-07-16 (×2): qty 12, 84d supply, fill #0

## 2024-06-14 MED ORDER — POTASSIUM CHLORIDE ER 20 MEQ PO TBCR
1.0000 | EXTENDED_RELEASE_TABLET | Freq: Two times a day (BID) | ORAL | 3 refills | Status: AC
  Filled 2024-06-14 – 2024-07-16 (×2): qty 180, 90d supply, fill #0

## 2024-06-22 ENCOUNTER — Encounter (HOSPITAL_BASED_OUTPATIENT_CLINIC_OR_DEPARTMENT_OTHER): Payer: Self-pay | Admitting: Orthopedic Surgery

## 2024-06-23 ENCOUNTER — Other Ambulatory Visit (HOSPITAL_COMMUNITY): Payer: Self-pay

## 2024-06-26 ENCOUNTER — Other Ambulatory Visit: Payer: Self-pay

## 2024-06-26 ENCOUNTER — Other Ambulatory Visit (HOSPITAL_COMMUNITY): Payer: Self-pay

## 2024-06-26 MED ORDER — TRIAMCINOLONE ACETONIDE 0.1 % EX CREA
TOPICAL_CREAM | CUTANEOUS | 5 refills | Status: AC
  Filled 2024-06-26: qty 453, 90d supply, fill #0
  Filled 2024-07-16: qty 453, 90d supply, fill #1

## 2024-06-28 ENCOUNTER — Other Ambulatory Visit (HOSPITAL_COMMUNITY): Payer: Self-pay

## 2024-06-28 ENCOUNTER — Other Ambulatory Visit: Payer: Self-pay

## 2024-06-30 ENCOUNTER — Other Ambulatory Visit: Payer: Self-pay

## 2024-06-30 ENCOUNTER — Encounter (HOSPITAL_BASED_OUTPATIENT_CLINIC_OR_DEPARTMENT_OTHER)
Admission: RE | Admit: 2024-06-30 | Discharge: 2024-06-30 | Disposition: A | Discharge status: Home / Self Care | Source: Ambulatory Visit | Attending: Orthopedic Surgery | Admitting: Orthopedic Surgery

## 2024-06-30 DIAGNOSIS — Z01818 Encounter for other preprocedural examination: Secondary | ICD-10-CM | POA: Diagnosis present

## 2024-06-30 LAB — BASIC METABOLIC PANEL WITH GFR
Anion gap: 8 (ref 5–15)
BUN: 19 mg/dL (ref 6–20)
CO2: 25 mmol/L (ref 22–32)
Calcium: 9 mg/dL (ref 8.9–10.3)
Chloride: 105 mmol/L (ref 98–111)
Creatinine, Ser: 0.73 mg/dL (ref 0.44–1.00)
GFR, Estimated: >60 mL/min
Glucose, Bld: 82 mg/dL (ref 70–99)
Potassium: 4.1 mmol/L (ref 3.5–5.1)
Sodium: 138 mmol/L (ref 135–145)

## 2024-06-30 NOTE — Progress Notes (Signed)
 Notified patient via voicemail to come in today to complete an updated bmp and EKG for surgery scheduled with Dr. Murrell on 07/03/24 at [facility].

## 2024-06-30 NOTE — Progress Notes (Signed)

## 2024-07-01 ENCOUNTER — Other Ambulatory Visit (HOSPITAL_COMMUNITY): Payer: Self-pay

## 2024-07-01 MED ORDER — AMOXICILLIN-POT CLAVULANATE 875-125 MG PO TABS
1.0000 | ORAL_TABLET | Freq: Two times a day (BID) | ORAL | 0 refills | Status: AC
  Filled 2024-07-01 – 2024-07-02 (×2): qty 20, 10d supply, fill #0

## 2024-07-02 ENCOUNTER — Other Ambulatory Visit (HOSPITAL_COMMUNITY): Payer: Self-pay

## 2024-07-03 ENCOUNTER — Ambulatory Visit (HOSPITAL_BASED_OUTPATIENT_CLINIC_OR_DEPARTMENT_OTHER): Admitting: Anesthesiology

## 2024-07-03 ENCOUNTER — Other Ambulatory Visit (HOSPITAL_COMMUNITY): Payer: Self-pay

## 2024-07-03 ENCOUNTER — Ambulatory Visit (HOSPITAL_BASED_OUTPATIENT_CLINIC_OR_DEPARTMENT_OTHER)
Admission: RE | Admit: 2024-07-03 | Discharge: 2024-07-03 | Disposition: A | Discharge status: Home / Self Care | Attending: Orthopedic Surgery | Admitting: Orthopedic Surgery

## 2024-07-03 ENCOUNTER — Encounter (HOSPITAL_BASED_OUTPATIENT_CLINIC_OR_DEPARTMENT_OTHER): Payer: Self-pay | Admitting: Orthopedic Surgery

## 2024-07-03 ENCOUNTER — Encounter (HOSPITAL_BASED_OUTPATIENT_CLINIC_OR_DEPARTMENT_OTHER)
Admission: RE | Disposition: A | Discharge status: Home / Self Care | Payer: Self-pay | Source: Home / Self Care | Attending: Orthopedic Surgery

## 2024-07-03 ENCOUNTER — Other Ambulatory Visit: Payer: Self-pay

## 2024-07-03 DIAGNOSIS — I10 Essential (primary) hypertension: Secondary | ICD-10-CM | POA: Insufficient documentation

## 2024-07-03 DIAGNOSIS — G4730 Sleep apnea, unspecified: Secondary | ICD-10-CM | POA: Diagnosis not present

## 2024-07-03 DIAGNOSIS — Z7985 Long-term (current) use of injectable non-insulin antidiabetic drugs: Secondary | ICD-10-CM | POA: Diagnosis not present

## 2024-07-03 DIAGNOSIS — G932 Benign intracranial hypertension: Secondary | ICD-10-CM | POA: Insufficient documentation

## 2024-07-03 DIAGNOSIS — G5602 Carpal tunnel syndrome, left upper limb: Secondary | ICD-10-CM | POA: Insufficient documentation

## 2024-07-03 DIAGNOSIS — E119 Type 2 diabetes mellitus without complications: Secondary | ICD-10-CM | POA: Insufficient documentation

## 2024-07-03 DIAGNOSIS — Z79899 Other long term (current) drug therapy: Secondary | ICD-10-CM | POA: Insufficient documentation

## 2024-07-03 DIAGNOSIS — K219 Gastro-esophageal reflux disease without esophagitis: Secondary | ICD-10-CM | POA: Diagnosis not present

## 2024-07-03 HISTORY — PX: CARPAL TUNNEL RELEASE: SHX101

## 2024-07-03 SURGERY — CARPAL TUNNEL RELEASE
Anesthesia: General | Site: Wrist | Laterality: Left

## 2024-07-03 MED ORDER — AMISULPRIDE (ANTIEMETIC) 5 MG/2ML IV SOLN: 10.0000 mg | Freq: Once | INTRAVENOUS | Status: DC | PRN

## 2024-07-03 MED ORDER — MIDAZOLAM HCL 5 MG/5ML IJ SOLN
INTRAMUSCULAR | Status: DC | PRN
  Administered 2024-07-03: 2 mg via INTRAVENOUS

## 2024-07-03 MED ORDER — ACETAMINOPHEN 10 MG/ML IV SOLN
INTRAVENOUS | Status: AC
  Filled 2024-07-03: qty 100

## 2024-07-03 MED ORDER — OXYCODONE HCL 5 MG/5ML PO SOLN: 5.0000 mg | Freq: Once | ORAL | Status: DC | PRN

## 2024-07-03 MED ORDER — 0.9 % SODIUM CHLORIDE (POUR BTL) OPTIME
TOPICAL | Status: DC | PRN
  Administered 2024-07-03: 1000 mL

## 2024-07-03 MED ORDER — PROPOFOL 10 MG/ML IV BOLUS
INTRAVENOUS | Status: AC
  Filled 2024-07-03: qty 20

## 2024-07-03 MED ORDER — SUCCINYLCHOLINE CHLORIDE 200 MG/10ML IV SOSY
PREFILLED_SYRINGE | INTRAVENOUS | Status: DC | PRN
  Administered 2024-07-03: 100 mg via INTRAVENOUS

## 2024-07-03 MED ORDER — CEFAZOLIN SODIUM-DEXTROSE 2-4 GM/100ML-% IV SOLN
INTRAVENOUS | Status: AC
  Filled 2024-07-03: qty 100

## 2024-07-03 MED ORDER — FENTANYL CITRATE (PF) 100 MCG/2ML IJ SOLN
INTRAMUSCULAR | Status: AC
  Filled 2024-07-03: qty 2

## 2024-07-03 MED ORDER — BUPIVACAINE HCL (PF) 0.25 % IJ SOLN
INTRAMUSCULAR | Status: AC
  Filled 2024-07-03: qty 30

## 2024-07-03 MED ORDER — PROPOFOL 10 MG/ML IV BOLUS
INTRAVENOUS | Status: DC | PRN
  Administered 2024-07-03: 200 mg via INTRAVENOUS
  Administered 2024-07-03: 70 mg via INTRAVENOUS

## 2024-07-03 MED ORDER — ONDANSETRON HCL 4 MG/2ML IJ SOLN
INTRAMUSCULAR | Status: AC
  Filled 2024-07-03: qty 2

## 2024-07-03 MED ORDER — ACETAMINOPHEN 10 MG/ML IV SOLN
1000.0000 mg | Freq: Once | INTRAVENOUS | Status: DC | PRN
  Administered 2024-07-03: 1000 mg via INTRAVENOUS

## 2024-07-03 MED ORDER — SUCCINYLCHOLINE CHLORIDE 200 MG/10ML IV SOSY
PREFILLED_SYRINGE | INTRAVENOUS | Status: AC
  Filled 2024-07-03: qty 10

## 2024-07-03 MED ORDER — CEFAZOLIN SODIUM-DEXTROSE 2-4 GM/100ML-% IV SOLN
2.0000 g | INTRAVENOUS | Status: AC
  Administered 2024-07-03: 2 g via INTRAVENOUS

## 2024-07-03 MED ORDER — LACTATED RINGERS IV SOLN: INTRAVENOUS | Status: DC

## 2024-07-03 MED ORDER — HYDROCODONE-ACETAMINOPHEN 5-325 MG PO TABS
1.0000 | ORAL_TABLET | Freq: Four times a day (QID) | ORAL | 0 refills | Status: AC | PRN
  Filled 2024-07-03 (×2): qty 15, 4d supply, fill #0

## 2024-07-03 MED ORDER — DEXAMETHASONE SOD PHOSPHATE PF 10 MG/ML IJ SOLN
INTRAMUSCULAR | Status: DC | PRN
  Administered 2024-07-03: 5 mg via INTRAVENOUS

## 2024-07-03 MED ORDER — LIDOCAINE HCL (CARDIAC) PF 100 MG/5ML IV SOSY
PREFILLED_SYRINGE | INTRAVENOUS | Status: DC | PRN
  Administered 2024-07-03: 100 mg via INTRAVENOUS

## 2024-07-03 MED ORDER — FENTANYL CITRATE (PF) 100 MCG/2ML IJ SOLN: 25.0000 ug | INTRAMUSCULAR | Status: DC | PRN

## 2024-07-03 MED ORDER — FENTANYL CITRATE (PF) 100 MCG/2ML IJ SOLN
INTRAMUSCULAR | Status: DC | PRN
  Administered 2024-07-03: 50 ug via INTRAVENOUS
  Administered 2024-07-03 (×2): 25 ug via INTRAVENOUS

## 2024-07-03 MED ORDER — OXYCODONE HCL 5 MG PO TABS: 5.0000 mg | ORAL_TABLET | Freq: Once | ORAL | Status: DC | PRN

## 2024-07-03 MED ORDER — MIDAZOLAM HCL 2 MG/2ML IJ SOLN
INTRAMUSCULAR | Status: AC
  Filled 2024-07-03: qty 2

## 2024-07-03 MED ORDER — ONDANSETRON HCL 4 MG/2ML IJ SOLN: 4.0000 mg | Freq: Once | INTRAMUSCULAR | Status: DC | PRN

## 2024-07-03 MED ORDER — BUPIVACAINE HCL (PF) 0.25 % IJ SOLN
INTRAMUSCULAR | Status: DC | PRN
  Administered 2024-07-03: 9 mL

## 2024-07-03 SURGICAL SUPPLY — 28 items
BLADE SURG 15 STRL LF DISP TIS (BLADE) ×2 IMPLANT
BNDG COMPR ESMARK 4X3 LF (GAUZE/BANDAGES/DRESSINGS) IMPLANT
BNDG ELASTIC 3INX 5YD STR LF (GAUZE/BANDAGES/DRESSINGS) ×1 IMPLANT
BNDG GAUZE DERMACEA FLUFF 4 (GAUZE/BANDAGES/DRESSINGS) ×1 IMPLANT
CHLORAPREP W/TINT 26 (MISCELLANEOUS) ×1 IMPLANT
CORD BIPOLAR FORCEPS 12FT (ELECTRODE) ×1 IMPLANT
COVER BACK TABLE 60X90IN (DRAPES) ×1 IMPLANT
COVER MAYO STAND STRL (DRAPES) ×1 IMPLANT
CUFF TOURN SGL QUICK 18X4 (TOURNIQUET CUFF) ×1 IMPLANT
DRAPE EXTREMITY T 121X128X90 (DISPOSABLE) ×1 IMPLANT
DRAPE SURG 17X23 STRL (DRAPES) ×1 IMPLANT
GAUZE PAD ABD 8X10 STRL (GAUZE/BANDAGES/DRESSINGS) ×1 IMPLANT
GAUZE SPONGE 4X4 12PLY STRL (GAUZE/BANDAGES/DRESSINGS) ×1 IMPLANT
GAUZE XEROFORM 1X8 LF (GAUZE/BANDAGES/DRESSINGS) ×1 IMPLANT
GLOVE BIO SURGEON STRL SZ7.5 (GLOVE) ×1 IMPLANT
GLOVE BIOGEL PI IND STRL 8 (GLOVE) ×1 IMPLANT
GOWN STRL REUS W/ TWL LRG LVL3 (GOWN DISPOSABLE) ×1 IMPLANT
GOWN STRL REUS W/TWL XL LVL3 (GOWN DISPOSABLE) ×1 IMPLANT
NEEDLE HYPO 25X1 1.5 SAFETY (NEEDLE) ×1 IMPLANT
PACK BASIN DAY SURGERY FS (CUSTOM PROCEDURE TRAY) ×1 IMPLANT
PADDING CAST ABS COTTON 4X4 ST (CAST SUPPLIES) ×1 IMPLANT
SOLN 0.9% NACL POUR BTL 1000ML (IV SOLUTION) ×1 IMPLANT
STOCKINETTE 4X48 STRL (DRAPES) ×1 IMPLANT
SUT ETHILON 4 0 PS 2 18 (SUTURE) ×1 IMPLANT
SYR BULB EAR ULCER 3OZ GRN STR (SYRINGE) ×1 IMPLANT
SYR CONTROL 10ML LL (SYRINGE) ×1 IMPLANT
TOWEL GREEN STERILE FF (TOWEL DISPOSABLE) ×2 IMPLANT
UNDERPAD 30X36 HEAVY ABSORB (UNDERPADS AND DIAPERS) ×1 IMPLANT

## 2024-07-03 NOTE — Transfer of Care (Signed)
 Immediate Anesthesia Transfer of Care Note  Patient: [name]  Procedure(s) Performed: CARPAL TUNNEL RELEASE (Left: Wrist)  Patient Location: PACU  Anesthesia Type:General  Level of Consciousness: drowsy, patient cooperative, and responds to stimulation  Airway & Oxygen Therapy: Patient Spontanous Breathing and Patient connected to face mask oxygen  Post-op Assessment: Report given to RN and Post -op Vital signs reviewed and stable  Post vital signs: Reviewed and stable  Last Vitals:  Vitals Value Taken Time  BP 136/83 07/03/24 13:40  Temp 36.3 C 07/03/24 13:40  Pulse 81 07/03/24 13:43  Resp 28 07/03/24 13:43  SpO2 96 % 07/03/24 13:43  Vitals shown include unfiled device data.  Last Pain:  Vitals:   07/03/24 1139  TempSrc: Temporal  PainSc: 0-No pain      Patients Stated Pain Goal: 3 (07/03/24 1139)  Complications: No notable events documented.

## 2024-07-03 NOTE — H&P (Signed)
 [name] is an 58 y.o. female.   Chief Complaint: carpal tunnel syndrome HPI: 58 y.o. yo female with numbness and tingling left hand.  Nocturnal symptoms. Positive nerve conduction studies. She wishes to have left carpal tunnel release.   Allergies: Allergies[1]  Past Medical History:  Diagnosis Date   Allergy    takes Zyrtec daily   Arthritis    Back pain    arthritis   Benign intracranial hypertension    Chronic constipation    takes Stool Softener   Dizziness    Eczema    Family history of anesthesia complication    mother got confused some after anesthesia   Gastroesophageal reflux disease    takes Pepcid  daily   Headache(784.0)    benign intercranial HTN d/t CSF leak, migraines   History of bronchitis    pt states a very long time ago   History of MRSA infection    > 76yrs ago   Hypertension    takes Lotensin  daily   Lumbago    Obesity, unspecified    Peripheral edema    Pulsatile tinnitus    Sleep apnea    Unspecified vitamin D deficiency     Past Surgical History:  Procedure Laterality Date   ABDOMINAL HYSTERECTOMY     ANTERIOR AND POSTERIOR REPAIR N/A 10/10/2015   Procedure: ANTERIOR (CYSTOCELE) AND POSTERIOR REPAIR (RECTOCELE);  Surgeon: Glendia Elizabeth, MD;  Location: [facility];  Service: Urology;  Laterality: N/A;   CARDIOVASCULAR STRESS TEST     CARPAL TUNNEL RELEASE Right 07/05/2023   Procedure: RIGHT CARPAL TUNNEL RELEASE;  Surgeon: Murrell Drivers, MD;  Location: [facility];  Service: Orthopedics;  Laterality: Right;  30 MIN   CESAREAN SECTION  13yrs ago   CHOLECYSTECTOMY     CYSTO N/A 10/10/2015   Procedure: CYSTO;  Surgeon: Glendia Elizabeth, MD;  Location: [facility];  Service: Urology;  Laterality: N/A;   GALLBLADDER SURGERY  66yrs ago   SINUS ENDO W/FUSION Bilateral 02/03/2013   Procedure: ENDOSCOPIC SINUS SURGERY WITH FUSION NAVIGATION;  Surgeon: Merilee Kraft, MD;  Location: [facility] OR;  Service: ENT;  Laterality: Bilateral;  Repaired  CSF Leak   US  ECHOCARDIOGRAPHY     VAGINAL HYSTERECTOMY N/A 10/10/2015   Procedure: HYSTERECTOMY VAGINAL;  Surgeon: Dickie Carder, MD;  Location: [facility];  Service: Gynecology;  Laterality: N/A;   VAGINAL PROLAPSE REPAIR N/A 10/10/2015   Procedure: VAULT PROLAPSE AND GRAFT;  Surgeon: Glendia Elizabeth, MD;  Location: [facility];  Service: Urology;  Laterality: N/A;    Family History: Family History  Problem Relation Age of Onset   Hypertension Mother    Diabetes Mother    Hypertension Father    Prostate cancer Father    Breast cancer Maternal Aunt 56   Ovarian cancer Niece 31   Heart attack Neg Hx    Hyperlipidemia Neg Hx    Sudden death Neg Hx    BRCA 1/2 Neg Hx     Social History:   reports that she has never smoked. She has never used smokeless tobacco. She reports that she does not drink alcohol and does not use drugs.  Medications: Medications Prior to Admission  Medication Sig Dispense Refill   acetaZOLAMIDE  (DIAMOX ) 250 MG tablet Take 1 tablet (250 mg total) by mouth in the morning AND 2 tablets (500 mg total) every evening. 270 tablet 3   betamethasone  dipropionate 0.05 % cream Apply a small amount to affected area twice a day ,for stubborn areas. 45  g 2   cetirizine (ZYRTEC) 10 MG tablet Take 10 mg by mouth daily.     Cholecalciferol  (VITAMIN D3) 1.25 MG (50000 UT) CAPS Take 1 capsule (1.25 mg total) by mouth once a week. 12 capsule 3   famotidine  (PEPCID ) 20 MG tablet Take 20 mg by mouth 2 (two) times daily.      Fluocinolone  Acetonide Scalp 0.01 % OIL Apply to scalp 3-4 times per week for seb derm as needed 118 mL 5   fluocinonide  (LIDEX ) 0.05 % external solution Apply a small amount topically twice a day as needed for flares 60 mL 3   fluticasone  (FLONASE ) 50 MCG/ACT nasal spray Place 2 sprays into both nostrils daily. 1 g 0   hydrocortisone  2.5 % cream Apply once to twice daily as needed to facial seborrheic dermatitis. Never use on smooth or normal skin. 28 g 2    hydrOXYzine  (ATARAX ) 25 MG tablet Take 1 tablet (25 mg total) by mouth at bedtime as needed. 90 tablet 2   ipratropium (ATROVENT ) 0.06 % nasal spray Place 2 sprays into both nostrils 3 (three) times daily. 15 mL 2   magnesium  oxide (MAG-OX) 400 (240 Mg) MG tablet Take 1 tablet (400 mg total) by mouth 2 (two) times daily. 180 tablet 3   montelukast  (SINGULAIR ) 10 MG tablet Take 1 tablet (10 mg total) by mouth daily. 90 tablet 2   nebivolol  (BYSTOLIC ) 5 MG tablet Take 1 tablet (5 mg total) by mouth daily. 90 tablet 3   pantoprazole  (PROTONIX ) 40 MG tablet Take 1 tablet (40 mg total) by mouth daily. 90 tablet 1   Potassium Chloride  ER 20 MEQ TBCR Take 1 tablet (20 mEq total) by mouth 2 (two) times daily with a meal. 180 tablet 3   rizatriptan  (MAXALT ) 10 MG tablet TAKE 1 TABLET BY MOUTH 3 TIMES DAILY AS NEEDED FOR MIGRAINE AS DIRECTED 10 tablet 5   sennosides-docusate sodium  (SENOKOT-S) 8.6-50 MG tablet Take 2 tablets by mouth as needed.      silver  sulfADIAZINE  (SILVADENE ) 1 % cream Apply a small amount 2 times weekly to affected areas in groin. 400 g 0   tirzepatide  (MOUNJARO ) 5 MG/0.5ML Pen Inject 5 mg into the skin once a week. 2 mL 3   triamcinolone  cream (KENALOG ) 0.1 % Apply topically to affected areas of psoriasis twice daily for 2 weeks, then daily for 1 week, then every other day. Restart when flaring 453 g 5   triamterene -hydrochlorothiazide  (MAXZIDE ) 75-50 MG tablet Take 1 tablet by mouth daily. (stop losartan) 90 tablet 3   venlafaxine  XR (EFFEXOR -XR) 75 MG 24 hr capsule Take 1 capsule (75 mg total) by mouth daily. 90 capsule 3   albuterol  (VENTOLIN  HFA) 108 (90 Base) MCG/ACT inhaler Inhale 2 puffs by mouth every 4 hours as needed for wheezing 6.7 g 3   amoxicillin -clavulanate (AUGMENTIN ) 875-125 MG tablet Take 1 tablet by mouth 2 (two) times daily. 20 tablet 0   amoxicillin -clavulanate (AUGMENTIN ) 875-125 MG tablet Take 1 tablet by mouth 2 (two) times daily for 10 days. 20 tablet 0    azithromycin  (ZITHROMAX ) 250 MG tablet Take 2 tablets on day 1, then one daily 6 tablet 0   estradiol  (ESTRACE  VAGINAL) 0.1 MG/GM vaginal cream Place 0.5 g vaginally 2 (two) times a week. 42.5 g 8    No results found for this or any previous visit (from the past 48 hours).  No results found.    Blood pressure 111/72, pulse 68, temperature 98.6 F (  37 C), temperature source Temporal, resp. rate 15, height 5' 4 (1.626 m), weight 103.7 kg, last menstrual period 12/02/2014, SpO2 100%.  General appearance: alert, cooperative, and appears stated age Head: Normocephalic, without obvious abnormality, atraumatic Neck: supple, symmetrical, trachea midline Extremities: Intact capillary refill all digits.  +epl/fpl/io.  No wounds.  Skin: Skin color, texture, turgor normal. No rashes or lesions Neurologic: Grossly normal Incision/Wound: none  Assessment/Plan Left carpal tunnel syndrome.  Non operative and operative treatment options have been discussed with the patient and patient wishes to proceed with operative treatment. Risks, benefits, and alternatives of surgery have been discussed and the patient agrees with the plan of care.   [name] 07/03/2024, 12:27 PM      [1]  Allergies Allergen Reactions   Benazepril -Hydrochlorothiazide  Swelling    angioedema   Codeine Nausea And Vomiting   Hydrocodone  Itching

## 2024-07-03 NOTE — Anesthesia Preprocedure Evaluation (Addendum)
"                                    Anesthesia Evaluation  Patient identified by MRN, date of birth, ID band Patient awake    Reviewed: Allergy & Precautions, NPO status , Patient's Chart, lab work & pertinent test results, reviewed documented beta blocker date and time   History of Anesthesia Complications Negative for: history of anesthetic complications  Airway Mallampati: I  TM Distance: >3 FB Neck ROM: Full    Dental  (+) Teeth Intact, Dental Advisory Given   Pulmonary sleep apnea and Continuous Positive Airway Pressure Ventilation    breath sounds clear to auscultation       Cardiovascular hypertension, Pt. on home beta blockers and Pt. on medications  Rhythm:Regular Rate:Normal  NucMed ST (2018): 1. No reversible ischemia or infarction. 2. Normal left ventricular wall motion. 3. Left ventricular ejection fraction 60% 4. 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 )    GI/Hepatic ,GERD  Medicated and Controlled,,  Endo/Other  diabetes, Well Controlled, Type 2  GLP1-agonist > 1 week ago (06/18/24)  Renal/GU Renal disease     Musculoskeletal  (+) Arthritis , Osteoarthritis,   Carpal Tunnel    Abdominal   Peds  Hematology   Anesthesia Other Findings   Reproductive/Obstetrics                              Anesthesia Physical Anesthesia Plan  ASA: 2  Anesthesia Plan: General   Post-op Pain Management:    Induction: Intravenous and Rapid sequence  PONV Risk Score and Plan: 3 and Ondansetron  and Treatment may vary due to age or medical condition  Airway Management Planned: Oral ETT  Additional Equipment:   Intra-op Plan:   Post-operative Plan: Extubation in OR  Informed Consent: I have reviewed the patients History and Physical, chart, labs and discussed the procedure including the risks, benefits and alternatives for the proposed anesthesia with the  patient or authorized representative who has indicated his/her understanding and acceptance.     Dental advisory given  Plan Discussed with: CRNA  Anesthesia Plan Comments: (Due to patient's request and her concern for possible aspiration (feels like she does with her CPAP), plan for ETT. )        Anesthesia Quick Evaluation  "

## 2024-07-03 NOTE — Op Note (Signed)
 07/03/2024 [facility]                              OPERATIVE REPORT   PREOPERATIVE DIAGNOSIS:  Left carpal tunnel syndrome  POSTOPERATIVE DIAGNOSIS:  Left carpal tunnel syndrome  PROCEDURE:  Left carpal tunnel release  SURGEON:  Franky Curia, MD  ASSISTANT:  Isaiah Anton, [facility]  ANESTHESIA: General  IV FLUIDS:  Per anesthesia flow sheet  ESTIMATED BLOOD LOSS:  Minimal  COMPLICATIONS:  None  SPECIMENS:  None  TOURNIQUET TIME:   Total Tourniquet Time Documented: Upper Arm (Left) - 12 minutes Total: Upper Arm (Left) - 12 minutes   DISPOSITION:  Stable to PACU  LOCATION: [facility]  INDICATIONS:  58 y.o. yo female with numbness and tingling left hand.  Nocturnal symptoms. Positive nerve conduction studies. She wishes to proceed with left carpal tunnel release.  Risks, benefits and alternatives of surgery were discussed including the risk of blood loss; infection; damage to nerves, vessels, tendons, ligaments, bone; failure of surgery; need for additional surgery; complications with wound healing; continued pain; recurrence of carpal tunnel syndrome; and damage to motor branch. She voiced understanding of these risks and elected to proceed.   OPERATIVE COURSE:  After being identified preoperatively by myself, the patient and I agreed upon the procedure and site of procedure.  The surgical site was marked.  Surgical consent had been signed.  She was given IV Ancef  as preoperative antibiotic prophylaxis.  She was transferred to the operating room and placed on the operating room table in supine position with the Left upper extremity on an armboard.  General anesthesia was induced by the anesthesiologist.  Left upper extremity was prepped and draped in normal sterile orthopaedic fashion.  A surgical pause was performed between the surgeons, anesthesia, and operating room staff, and all were in agreement as to the patient, procedure, and site of procedure.   Tourniquet at the proximal aspect of the extremity was inflated to 250 mmHg after exsanguination of the arm with an Esmarch bandage  Incision was made over the transverse carpal ligament and carried into the subcutaneous tissues by spreading technique.  Bipolar electrocautery was used to obtain hemostasis.  The palmar fascia was sharply incised.  The transverse carpal ligament was identified.  The fascia distal to the ligament was opened.  Retractor was placed and the flexor tendons were identified.  The flexor tendon to the little finger was identified and retracted radially.  The transverse carpal ligament was then incised from distal to proximal under direct visualization.  Scissors were used to split the distal aspect of the volar antebrachial fascia.  A finger was placed into the wound to ensure complete decompression, which was the case.  The nerve was examined.  It was flattened and hyperemic.  The motor branch was identified and was intact.  The wound was copiously irrigated with sterile saline.  It was then closed with 4-0 nylon in a horizontal mattress fashion.  It was injected with 0.25% plain Marcaine  to aid in postoperative analgesia.  It was dressed with sterile Xeroform, 4x4s, an ABD, and wrapped with Kerlix and an Ace bandage.  Tourniquet was deflated at 12 minutes.  Fingertips were pink with brisk capillary refill after deflation of the tourniquet.  Operative drapes were broken down.  The patient was awoken from anesthesia safely.  She was transferred back to stretcher and taken to the PACU in stable condition.  I will see her back in the office in 1 week for postoperative followup.  I will give her a prescription for Norco 5/325 1 tab PO q6 hours prn pain, dispense #15.    Dayne Chait, MD Electronically signed, 07/03/2024

## 2024-07-03 NOTE — Op Note (Signed)
 SURGERY ASSISTANT NOTE  PLACE OF SERVICE: [facility]   PATIENT INFORMATION: Name: [name] MRN#: [id number] DOB: June 15, 1966  Date of surgery: 07/03/2024 Time of surgery: 1:16 PM  SURGERY ASSISTANT NOTE:  Assistant name: Isaiah Anton, PA-C Note date: 07/03/2024  I assisted Dr. Franky Curia on the following procedure(s) for the above-noted patient in the date and time documented:   Left carpal tunnel release   I provided assistance on the case as follows:  Assistance with exposure, retraction, bleeding control, protection of vital structures, instrumentation  and closure.   Isaiah Anton, PA-C

## 2024-07-03 NOTE — Anesthesia Postprocedure Evaluation (Signed)
"   Anesthesia Post Note  Patient: [name]  Procedure(s) Performed: CARPAL TUNNEL RELEASE (Left: Wrist)     Patient location during evaluation: Phase II Anesthesia Type: General Level of consciousness: awake and awake and alert Pain management: pain level controlled Vital Signs Assessment: post-procedure vital signs reviewed and stable Respiratory status: spontaneous breathing, nonlabored ventilation and respiratory function stable Cardiovascular status: blood pressure returned to baseline Postop Assessment: no apparent nausea or vomiting, adequate PO intake and no headache Anesthetic complications: no Comments: Multiple bedside assessments throughout PACU stay. Discussed with the patient the concerns for possible negative pressure pulmonary edema intraoperatively. Sputum decreased in volume and improved in characterization throughout stay. SpO2 > 94% on RA for several hours before discharge home. Tolerating PO intake without nausea or difficulty. Discussed with the patient the importance of wearing her CPAP over the next 24 hours and to go to the Emergency Department if her symptoms get worse or if she develops difficulty breathing. Patient expressed her understanding and states she feels comfortable going home and proceeding with the discussed plan above.    No notable events documented.             [name]      "

## 2024-07-03 NOTE — Discharge Instructions (Addendum)

## 2024-07-03 NOTE — Anesthesia Procedure Notes (Signed)
 Procedure Name: Intubation Date/Time: 07/03/2024 12:48 PM  Performed by: Frost Kayla MATSU, CRNAPre-anesthesia Checklist: Patient identified, Emergency Drugs available, Suction available and Patient being monitored Patient Re-evaluated:Patient Re-evaluated prior to induction Oxygen Delivery Method: Circle system utilized Preoxygenation: Pre-oxygenation with 100% oxygen Induction Type: IV induction Ventilation: Mask ventilation without difficulty Laryngoscope Size: Miller and 3 Grade View: Grade I Tube type: Oral Tube size: 7.0 mm Number of attempts: 1 Airway Equipment and Method: Stylet and Oral airway Placement Confirmation: ETT inserted through vocal cords under direct vision, positive ETCO2 and breath sounds checked- equal and bilateral Secured at: 20 cm Tube secured with: Tape Dental Injury: Teeth and Oropharynx as per pre-operative assessment

## 2024-07-04 ENCOUNTER — Encounter (HOSPITAL_BASED_OUTPATIENT_CLINIC_OR_DEPARTMENT_OTHER): Payer: Self-pay | Admitting: Orthopedic Surgery

## 2024-07-16 ENCOUNTER — Other Ambulatory Visit (HOSPITAL_COMMUNITY): Payer: Self-pay

## 2024-07-16 MED ORDER — TRIAMTERENE-HCTZ 75-50 MG PO TABS
1.0000 | ORAL_TABLET | Freq: Every day | ORAL | 3 refills | Status: AC
  Filled 2024-07-16: qty 90, 90d supply, fill #0

## 2024-07-17 ENCOUNTER — Other Ambulatory Visit: Payer: Self-pay

## 2024-07-17 MED ORDER — BETAMETHASONE DIPROPIONATE 0.05 % EX CREA
TOPICAL_CREAM | CUTANEOUS | 2 refills | Status: AC
  Filled 2024-07-17: qty 45, 30d supply, fill #0

## 2025-02-01 ENCOUNTER — Ambulatory Visit: Admitting: Neurology
